# Patient Record
Sex: Female | Born: 1950
Health system: Southern US, Community
[De-identification: ages and names within clinical notes are randomized; demographics above are authoritative.]

## PROBLEM LIST (undated history)

## (undated) DIAGNOSIS — E119 Type 2 diabetes mellitus without complications: Secondary | ICD-10-CM

## (undated) DIAGNOSIS — K449 Diaphragmatic hernia without obstruction or gangrene: Secondary | ICD-10-CM

## (undated) DIAGNOSIS — F411 Generalized anxiety disorder: Secondary | ICD-10-CM

## (undated) DIAGNOSIS — Z974 Presence of external hearing-aid: Secondary | ICD-10-CM

## (undated) DIAGNOSIS — K222 Esophageal obstruction: Secondary | ICD-10-CM

## (undated) DIAGNOSIS — T7840XA Allergy, unspecified, initial encounter: Secondary | ICD-10-CM

## (undated) DIAGNOSIS — R1310 Dysphagia, unspecified: Secondary | ICD-10-CM

## (undated) DIAGNOSIS — I10 Essential (primary) hypertension: Secondary | ICD-10-CM

## (undated) DIAGNOSIS — G5600 Carpal tunnel syndrome, unspecified upper limb: Secondary | ICD-10-CM

## (undated) DIAGNOSIS — K219 Gastro-esophageal reflux disease without esophagitis: Secondary | ICD-10-CM

## (undated) DIAGNOSIS — IMO0001 Reserved for inherently not codable concepts without codable children: Secondary | ICD-10-CM

## (undated) DIAGNOSIS — M6530 Trigger finger, unspecified finger: Secondary | ICD-10-CM

## (undated) DIAGNOSIS — R42 Dizziness and giddiness: Secondary | ICD-10-CM

## (undated) DIAGNOSIS — N8110 Cystocele, unspecified: Secondary | ICD-10-CM

## (undated) DIAGNOSIS — Z794 Long term (current) use of insulin: Secondary | ICD-10-CM

## (undated) DIAGNOSIS — M1990 Unspecified osteoarthritis, unspecified site: Secondary | ICD-10-CM

## (undated) DIAGNOSIS — E785 Hyperlipidemia, unspecified: Secondary | ICD-10-CM

## (undated) DIAGNOSIS — J302 Other seasonal allergic rhinitis: Secondary | ICD-10-CM

## (undated) DIAGNOSIS — Z8489 Family history of other specified conditions: Secondary | ICD-10-CM

## (undated) DIAGNOSIS — K224 Dyskinesia of esophagus: Secondary | ICD-10-CM

## (undated) DIAGNOSIS — F329 Major depressive disorder, single episode, unspecified: Secondary | ICD-10-CM

## (undated) DIAGNOSIS — K589 Irritable bowel syndrome without diarrhea: Secondary | ICD-10-CM

## (undated) DIAGNOSIS — L309 Dermatitis, unspecified: Secondary | ICD-10-CM

## (undated) DIAGNOSIS — F32A Depression, unspecified: Secondary | ICD-10-CM

## (undated) DIAGNOSIS — Z98811 Dental restoration status: Secondary | ICD-10-CM

## (undated) HISTORY — DX: Allergy, unspecified, initial encounter: T78.40XA

## (undated) HISTORY — PX: EYE SURGERY: SHX253

## (undated) HISTORY — DX: Dizziness and giddiness: R42

## (undated) HISTORY — DX: Irritable bowel syndrome, unspecified: K58.9

## (undated) HISTORY — PX: ADENOIDECTOMY: SUR15

## (undated) HISTORY — DX: Unspecified osteoarthritis, unspecified site: M19.90

## (undated) HISTORY — DX: Carpal tunnel syndrome, unspecified upper limb: G56.00

## (undated) HISTORY — DX: Depression, unspecified: F32.A

## (undated) HISTORY — DX: Gastro-esophageal reflux disease without esophagitis: K21.9

## (undated) HISTORY — DX: Type 2 diabetes mellitus without complications: E11.9

## (undated) HISTORY — DX: Dyskinesia of esophagus: K22.4

## (undated) HISTORY — DX: Cystocele, unspecified: N81.10

## (undated) HISTORY — DX: Essential (primary) hypertension: I10

## (undated) HISTORY — DX: Generalized anxiety disorder: F41.1

## (undated) HISTORY — PX: COLONOSCOPY: SHX174

## (undated) HISTORY — DX: Hyperlipidemia, unspecified: E78.5

## (undated) HISTORY — DX: Diaphragmatic hernia without obstruction or gangrene: K44.9

## (undated) HISTORY — PX: TONSILLECTOMY: SUR1361

## (undated) HISTORY — DX: Esophageal obstruction: K22.2

## (undated) HISTORY — DX: Major depressive disorder, single episode, unspecified: F32.9

## (undated) HISTORY — PX: NASAL SINUS SURGERY: SHX719

---

## 1998-01-10 ENCOUNTER — Other Ambulatory Visit: Admission: RE | Admit: 1998-01-10 | Discharge: 1998-01-10 | Payer: Self-pay | Admitting: *Deleted

## 1998-11-27 ENCOUNTER — Other Ambulatory Visit: Admission: RE | Admit: 1998-11-27 | Discharge: 1998-11-27 | Payer: Self-pay | Admitting: Obstetrics & Gynecology

## 1999-05-16 ENCOUNTER — Other Ambulatory Visit: Admission: RE | Admit: 1999-05-16 | Discharge: 1999-05-16 | Payer: Self-pay | Admitting: Gastroenterology

## 1999-05-16 ENCOUNTER — Encounter (INDEPENDENT_AMBULATORY_CARE_PROVIDER_SITE_OTHER): Payer: Self-pay | Admitting: Specialist

## 1999-08-30 ENCOUNTER — Encounter: Payer: Self-pay | Admitting: Obstetrics and Gynecology

## 1999-08-30 ENCOUNTER — Encounter: Admission: RE | Admit: 1999-08-30 | Discharge: 1999-08-30 | Payer: Self-pay | Admitting: Obstetrics & Gynecology

## 2000-01-14 ENCOUNTER — Other Ambulatory Visit: Admission: RE | Admit: 2000-01-14 | Discharge: 2000-01-14 | Payer: Self-pay | Admitting: Obstetrics and Gynecology

## 2000-04-14 ENCOUNTER — Encounter: Admission: RE | Admit: 2000-04-14 | Discharge: 2000-07-13 | Payer: Self-pay | Admitting: *Deleted

## 2001-04-13 ENCOUNTER — Other Ambulatory Visit: Admission: RE | Admit: 2001-04-13 | Discharge: 2001-04-13 | Payer: Self-pay | Admitting: Obstetrics and Gynecology

## 2002-04-26 ENCOUNTER — Other Ambulatory Visit: Admission: RE | Admit: 2002-04-26 | Discharge: 2002-04-26 | Payer: Self-pay | Admitting: Obstetrics and Gynecology

## 2002-04-28 ENCOUNTER — Encounter: Admission: RE | Admit: 2002-04-28 | Discharge: 2002-04-28 | Payer: Self-pay | Admitting: Obstetrics and Gynecology

## 2002-04-28 ENCOUNTER — Encounter: Payer: Self-pay | Admitting: Obstetrics and Gynecology

## 2003-06-16 ENCOUNTER — Other Ambulatory Visit: Admission: RE | Admit: 2003-06-16 | Discharge: 2003-06-16 | Payer: Self-pay | Admitting: Obstetrics and Gynecology

## 2003-09-28 ENCOUNTER — Encounter: Admission: RE | Admit: 2003-09-28 | Discharge: 2003-09-28 | Payer: Self-pay | Admitting: Obstetrics and Gynecology

## 2003-10-18 ENCOUNTER — Encounter: Admission: RE | Admit: 2003-10-18 | Discharge: 2003-11-15 | Payer: Self-pay | Admitting: Orthopedic Surgery

## 2003-12-13 ENCOUNTER — Ambulatory Visit (HOSPITAL_COMMUNITY): Admission: RE | Admit: 2003-12-13 | Discharge: 2003-12-13 | Payer: Self-pay | Admitting: Gastroenterology

## 2003-12-14 ENCOUNTER — Encounter: Payer: Self-pay | Admitting: Gastroenterology

## 2003-12-14 DIAGNOSIS — K5289 Other specified noninfective gastroenteritis and colitis: Secondary | ICD-10-CM

## 2004-10-05 ENCOUNTER — Encounter: Admission: RE | Admit: 2004-10-05 | Discharge: 2004-10-05 | Payer: Self-pay | Admitting: Obstetrics and Gynecology

## 2004-10-18 ENCOUNTER — Encounter: Admission: RE | Admit: 2004-10-18 | Discharge: 2004-10-18 | Payer: Self-pay | Admitting: Obstetrics and Gynecology

## 2004-11-26 ENCOUNTER — Other Ambulatory Visit: Admission: RE | Admit: 2004-11-26 | Discharge: 2004-11-26 | Payer: Self-pay | Admitting: Obstetrics and Gynecology

## 2004-11-27 ENCOUNTER — Ambulatory Visit (HOSPITAL_COMMUNITY): Admission: RE | Admit: 2004-11-27 | Discharge: 2004-11-27 | Payer: Self-pay | Admitting: Obstetrics and Gynecology

## 2005-11-22 ENCOUNTER — Encounter: Admission: RE | Admit: 2005-11-22 | Discharge: 2005-11-22 | Payer: Self-pay | Admitting: Obstetrics and Gynecology

## 2006-01-13 ENCOUNTER — Other Ambulatory Visit: Admission: RE | Admit: 2006-01-13 | Discharge: 2006-01-13 | Payer: Self-pay | Admitting: Obstetrics and Gynecology

## 2006-04-15 ENCOUNTER — Ambulatory Visit: Payer: Self-pay | Admitting: Gastroenterology

## 2006-05-12 ENCOUNTER — Ambulatory Visit: Payer: Self-pay | Admitting: Gastroenterology

## 2006-05-12 ENCOUNTER — Encounter (INDEPENDENT_AMBULATORY_CARE_PROVIDER_SITE_OTHER): Payer: Self-pay | Admitting: *Deleted

## 2006-05-12 DIAGNOSIS — K2990 Gastroduodenitis, unspecified, without bleeding: Secondary | ICD-10-CM

## 2006-05-12 DIAGNOSIS — K2210 Ulcer of esophagus without bleeding: Secondary | ICD-10-CM

## 2006-05-12 DIAGNOSIS — K2970 Gastritis, unspecified, without bleeding: Secondary | ICD-10-CM | POA: Insufficient documentation

## 2006-05-28 ENCOUNTER — Encounter: Payer: Self-pay | Admitting: Gastroenterology

## 2006-05-28 ENCOUNTER — Ambulatory Visit (HOSPITAL_COMMUNITY): Admission: RE | Admit: 2006-05-28 | Discharge: 2006-05-28 | Payer: Self-pay | Admitting: Gastroenterology

## 2006-05-28 HISTORY — PX: ESOPHAGEAL MANOMETRY: SHX1526

## 2006-06-20 ENCOUNTER — Ambulatory Visit: Payer: Self-pay | Admitting: Gastroenterology

## 2006-06-25 ENCOUNTER — Ambulatory Visit: Payer: Self-pay | Admitting: Gastroenterology

## 2006-07-09 ENCOUNTER — Ambulatory Visit: Payer: Self-pay | Admitting: Gastroenterology

## 2006-08-12 ENCOUNTER — Ambulatory Visit: Payer: Self-pay | Admitting: Gastroenterology

## 2007-03-24 ENCOUNTER — Ambulatory Visit: Payer: Self-pay | Admitting: Gastroenterology

## 2007-03-31 ENCOUNTER — Encounter: Admission: RE | Admit: 2007-03-31 | Discharge: 2007-03-31 | Payer: Self-pay | Admitting: Obstetrics and Gynecology

## 2007-04-14 ENCOUNTER — Ambulatory Visit: Payer: Self-pay | Admitting: Psychology

## 2007-04-28 ENCOUNTER — Ambulatory Visit: Payer: Self-pay | Admitting: Psychology

## 2007-05-06 ENCOUNTER — Ambulatory Visit: Payer: Self-pay | Admitting: Psychology

## 2007-05-13 ENCOUNTER — Ambulatory Visit: Payer: Self-pay | Admitting: Psychology

## 2007-05-19 ENCOUNTER — Ambulatory Visit: Payer: Self-pay | Admitting: Psychology

## 2007-05-26 ENCOUNTER — Ambulatory Visit: Payer: Self-pay | Admitting: Psychology

## 2007-06-03 ENCOUNTER — Ambulatory Visit: Payer: Self-pay | Admitting: Psychology

## 2007-06-16 ENCOUNTER — Ambulatory Visit: Payer: Self-pay | Admitting: Psychology

## 2007-07-02 ENCOUNTER — Ambulatory Visit: Payer: Self-pay | Admitting: Psychology

## 2007-07-28 ENCOUNTER — Ambulatory Visit: Payer: Self-pay | Admitting: Psychology

## 2007-08-12 ENCOUNTER — Ambulatory Visit: Payer: Self-pay | Admitting: Psychology

## 2007-09-09 ENCOUNTER — Ambulatory Visit: Payer: Self-pay | Admitting: Psychology

## 2007-09-21 ENCOUNTER — Ambulatory Visit: Payer: Self-pay | Admitting: Psychology

## 2007-10-06 ENCOUNTER — Ambulatory Visit: Payer: Self-pay | Admitting: Psychology

## 2007-11-03 ENCOUNTER — Ambulatory Visit: Payer: Self-pay | Admitting: Psychology

## 2007-11-17 ENCOUNTER — Ambulatory Visit: Payer: Self-pay | Admitting: Psychology

## 2007-12-01 ENCOUNTER — Ambulatory Visit: Payer: Self-pay | Admitting: Psychology

## 2007-12-15 ENCOUNTER — Ambulatory Visit: Payer: Self-pay | Admitting: Psychology

## 2007-12-29 ENCOUNTER — Ambulatory Visit: Payer: Self-pay | Admitting: Psychology

## 2008-01-12 ENCOUNTER — Ambulatory Visit: Payer: Self-pay | Admitting: Psychology

## 2008-01-22 DIAGNOSIS — E785 Hyperlipidemia, unspecified: Secondary | ICD-10-CM

## 2008-01-22 DIAGNOSIS — Z8719 Personal history of other diseases of the digestive system: Secondary | ICD-10-CM | POA: Insufficient documentation

## 2008-01-26 ENCOUNTER — Ambulatory Visit: Payer: Self-pay | Admitting: Psychology

## 2008-02-09 ENCOUNTER — Ambulatory Visit: Payer: Self-pay | Admitting: Psychology

## 2008-02-23 ENCOUNTER — Ambulatory Visit: Payer: Self-pay | Admitting: Psychology

## 2008-03-08 ENCOUNTER — Ambulatory Visit: Payer: Self-pay | Admitting: Psychology

## 2008-04-05 ENCOUNTER — Ambulatory Visit: Payer: Self-pay | Admitting: Psychology

## 2008-04-19 ENCOUNTER — Ambulatory Visit: Payer: Self-pay | Admitting: Psychology

## 2008-05-03 ENCOUNTER — Ambulatory Visit: Payer: Self-pay | Admitting: Psychology

## 2008-05-17 ENCOUNTER — Ambulatory Visit: Payer: Self-pay | Admitting: Psychology

## 2008-05-30 ENCOUNTER — Encounter: Admission: RE | Admit: 2008-05-30 | Discharge: 2008-05-30 | Payer: Self-pay | Admitting: Obstetrics and Gynecology

## 2008-05-31 ENCOUNTER — Ambulatory Visit: Payer: Self-pay | Admitting: Psychology

## 2008-06-28 ENCOUNTER — Ambulatory Visit: Payer: Self-pay | Admitting: Psychology

## 2008-07-12 ENCOUNTER — Ambulatory Visit: Payer: Self-pay | Admitting: Psychology

## 2008-07-26 ENCOUNTER — Ambulatory Visit: Payer: Self-pay | Admitting: Psychology

## 2008-08-23 ENCOUNTER — Ambulatory Visit: Payer: Self-pay | Admitting: Psychology

## 2008-09-06 ENCOUNTER — Ambulatory Visit: Payer: Self-pay | Admitting: Psychology

## 2008-09-20 ENCOUNTER — Ambulatory Visit: Payer: Self-pay | Admitting: Psychology

## 2008-10-04 ENCOUNTER — Ambulatory Visit: Payer: Self-pay | Admitting: Psychology

## 2008-11-01 ENCOUNTER — Ambulatory Visit: Payer: Self-pay | Admitting: Psychology

## 2008-11-15 ENCOUNTER — Ambulatory Visit: Payer: Self-pay | Admitting: Psychology

## 2008-11-29 ENCOUNTER — Ambulatory Visit: Payer: Self-pay | Admitting: Psychology

## 2008-12-02 ENCOUNTER — Ambulatory Visit: Payer: Self-pay | Admitting: Gastroenterology

## 2008-12-02 DIAGNOSIS — R1319 Other dysphagia: Secondary | ICD-10-CM

## 2008-12-02 DIAGNOSIS — F411 Generalized anxiety disorder: Secondary | ICD-10-CM

## 2008-12-02 DIAGNOSIS — K219 Gastro-esophageal reflux disease without esophagitis: Secondary | ICD-10-CM | POA: Insufficient documentation

## 2008-12-02 DIAGNOSIS — E118 Type 2 diabetes mellitus with unspecified complications: Secondary | ICD-10-CM

## 2008-12-05 ENCOUNTER — Ambulatory Visit: Payer: Self-pay | Admitting: Gastroenterology

## 2008-12-05 LAB — CONVERTED CEMR LAB: UREASE: NEGATIVE

## 2008-12-26 ENCOUNTER — Telehealth: Payer: Self-pay | Admitting: Gastroenterology

## 2008-12-27 ENCOUNTER — Ambulatory Visit: Payer: Self-pay | Admitting: Psychology

## 2009-01-24 ENCOUNTER — Ambulatory Visit: Payer: Self-pay | Admitting: Psychology

## 2009-02-07 ENCOUNTER — Ambulatory Visit: Payer: Self-pay | Admitting: Psychology

## 2009-03-07 ENCOUNTER — Ambulatory Visit: Payer: Self-pay | Admitting: Psychology

## 2009-03-21 ENCOUNTER — Ambulatory Visit: Payer: Self-pay | Admitting: Psychology

## 2009-04-04 ENCOUNTER — Ambulatory Visit: Payer: Self-pay | Admitting: Psychology

## 2009-04-18 ENCOUNTER — Ambulatory Visit: Payer: Self-pay | Admitting: Psychology

## 2009-05-02 ENCOUNTER — Ambulatory Visit: Payer: Self-pay | Admitting: Psychology

## 2009-05-16 ENCOUNTER — Ambulatory Visit: Payer: Self-pay | Admitting: Psychology

## 2009-05-30 ENCOUNTER — Ambulatory Visit: Payer: Self-pay | Admitting: Psychology

## 2009-06-08 ENCOUNTER — Telehealth: Payer: Self-pay | Admitting: Gastroenterology

## 2009-06-13 ENCOUNTER — Ambulatory Visit: Payer: Self-pay | Admitting: Psychology

## 2009-06-27 ENCOUNTER — Ambulatory Visit: Payer: Self-pay | Admitting: Psychology

## 2009-07-11 ENCOUNTER — Ambulatory Visit: Payer: Self-pay | Admitting: Psychology

## 2009-07-25 ENCOUNTER — Ambulatory Visit: Payer: Self-pay | Admitting: Psychology

## 2009-08-08 ENCOUNTER — Ambulatory Visit: Payer: Self-pay | Admitting: Psychology

## 2009-08-08 ENCOUNTER — Encounter: Admission: RE | Admit: 2009-08-08 | Discharge: 2009-08-08 | Payer: Self-pay | Admitting: Obstetrics and Gynecology

## 2009-08-22 ENCOUNTER — Ambulatory Visit: Payer: Self-pay | Admitting: Psychology

## 2009-09-05 ENCOUNTER — Ambulatory Visit: Payer: Self-pay | Admitting: Psychology

## 2009-09-19 ENCOUNTER — Ambulatory Visit: Payer: Self-pay | Admitting: Psychology

## 2009-10-03 ENCOUNTER — Ambulatory Visit: Payer: Self-pay | Admitting: Psychology

## 2009-10-17 ENCOUNTER — Ambulatory Visit: Payer: Self-pay | Admitting: Psychology

## 2009-10-31 ENCOUNTER — Ambulatory Visit: Payer: Self-pay | Admitting: Psychology

## 2009-11-28 ENCOUNTER — Ambulatory Visit: Payer: Self-pay | Admitting: Psychology

## 2009-12-26 ENCOUNTER — Ambulatory Visit: Payer: Self-pay | Admitting: Psychology

## 2009-12-27 ENCOUNTER — Telehealth: Payer: Self-pay | Admitting: Gastroenterology

## 2010-02-20 ENCOUNTER — Ambulatory Visit: Payer: Self-pay | Admitting: Psychology

## 2010-04-17 ENCOUNTER — Ambulatory Visit: Payer: Self-pay | Admitting: Psychology

## 2010-06-12 ENCOUNTER — Ambulatory Visit: Payer: Self-pay | Admitting: Psychology

## 2010-08-07 ENCOUNTER — Ambulatory Visit: Payer: Self-pay | Admitting: Psychology

## 2010-08-14 ENCOUNTER — Encounter: Admission: RE | Admit: 2010-08-14 | Discharge: 2010-08-14 | Payer: Self-pay | Admitting: Obstetrics and Gynecology

## 2010-08-16 ENCOUNTER — Encounter (INDEPENDENT_AMBULATORY_CARE_PROVIDER_SITE_OTHER): Payer: Self-pay | Admitting: *Deleted

## 2010-08-16 ENCOUNTER — Ambulatory Visit: Payer: Self-pay | Admitting: Gastroenterology

## 2010-08-16 DIAGNOSIS — K589 Irritable bowel syndrome without diarrhea: Secondary | ICD-10-CM

## 2010-08-16 LAB — CONVERTED CEMR LAB: IgA: 90 mg/dL (ref 68–378)

## 2010-08-17 LAB — CONVERTED CEMR LAB
ALT: 29 units/L (ref 0–35)
BUN: 15 mg/dL (ref 6–23)
Basophils Absolute: 0 10*3/uL (ref 0.0–0.1)
Basophils Relative: 0.3 % (ref 0.0–3.0)
Bilirubin, Direct: 0.1 mg/dL (ref 0.0–0.3)
Calcium: 9.6 mg/dL (ref 8.4–10.5)
Creatinine, Ser: 0.6 mg/dL (ref 0.4–1.2)
Eosinophils Absolute: 0.4 10*3/uL (ref 0.0–0.7)
Ferritin: 12.9 ng/mL (ref 10.0–291.0)
HCT: 37.8 % (ref 36.0–46.0)
Hemoglobin: 12.6 g/dL (ref 12.0–15.0)
Iron: 34 ug/dL — ABNORMAL LOW (ref 42–145)
Lymphs Abs: 2.2 10*3/uL (ref 0.7–4.0)
MCHC: 33.2 g/dL (ref 30.0–36.0)
MCV: 88.1 fL (ref 78.0–100.0)
Monocytes Absolute: 0.6 10*3/uL (ref 0.1–1.0)
Neutro Abs: 8.3 10*3/uL — ABNORMAL HIGH (ref 1.4–7.7)
RBC: 4.3 M/uL (ref 3.87–5.11)
RDW: 14.1 % (ref 11.5–14.6)
TSH: 2.1 microintl units/mL (ref 0.35–5.50)
Total Bilirubin: 0.4 mg/dL (ref 0.3–1.2)
Total Protein: 7.1 g/dL (ref 6.0–8.3)
Transferrin: 360.9 mg/dL — ABNORMAL HIGH (ref 212.0–360.0)
Vitamin B-12: 577 pg/mL (ref 211–911)

## 2010-08-28 ENCOUNTER — Encounter: Admission: RE | Admit: 2010-08-28 | Discharge: 2010-08-28 | Payer: Self-pay | Admitting: Obstetrics and Gynecology

## 2010-09-07 ENCOUNTER — Ambulatory Visit: Payer: Self-pay | Admitting: Psychology

## 2010-09-14 ENCOUNTER — Ambulatory Visit: Payer: Self-pay | Admitting: Gastroenterology

## 2010-09-14 LAB — CONVERTED CEMR LAB: UREASE: NEGATIVE

## 2010-09-18 ENCOUNTER — Encounter: Payer: Self-pay | Admitting: Gastroenterology

## 2010-10-02 ENCOUNTER — Ambulatory Visit: Admit: 2010-10-02 | Payer: Self-pay | Admitting: Psychology

## 2010-10-21 ENCOUNTER — Encounter: Payer: Self-pay | Admitting: Obstetrics and Gynecology

## 2010-10-30 ENCOUNTER — Ambulatory Visit: Admit: 2010-10-30 | Payer: Self-pay | Admitting: Psychology

## 2010-10-30 NOTE — Letter (Signed)
Summary: [facility] Instructions  [facility]  [address] [place], [place] [address]   Phone: [phone]  Fax: [phone]       Adrienne [name]    Aug 07, 1951    MRN: [id number]        Procedure Day /Date: 09/14/2010 Friday     Arrival Time: 1:00pm     Procedure Time: 2:00pm     Location of Procedure:                    X  [facility] (4th Floor)                        PREPARATION FOR COLONOSCOPY WITH MOVIPREP   Starting 5 days prior to your procedure 09/09/2010 do not eat nuts, seeds, popcorn, corn, beans, peas,  salads, or any raw vegetables.  Do not take any fiber supplements (e.g. Metamucil, Citrucel, and Benefiber).  THE DAY BEFORE YOUR PROCEDURE         DATE: 09/13/2010  DAY: Thursday  1.  Drink clear liquids the entire day-NO SOLID FOOD  2.  Do not drink anything colored red or purple.  Avoid juices with pulp.  No orange juice.  3.  Drink at least 64 oz. (8 glasses) of fluid/clear liquids during the day to prevent dehydration and help the prep work efficiently.  CLEAR LIQUIDS INCLUDE: Water Jello Ice Popsicles Tea (sugar ok, no milk/cream) Powdered fruit flavored drinks Coffee (sugar ok, no milk/cream) Gatorade Juice: apple, white grape, white cranberry  Lemonade Clear bullion, consomm, broth Carbonated beverages (any kind) Strained chicken noodle soup Hard Candy                             4.  In the morning, mix first dose of MoviPrep solution:    Empty 1 Pouch A and 1 Pouch B into the disposable container    Add lukewarm drinking water to the top line of the container. Mix to dissolve    Refrigerate (mixed solution should be used within 24 hrs)  5.  Begin drinking the prep at 5:00 p.m. The MoviPrep container is divided by 4 marks.   Every 15 minutes drink the solution down to the next mark (approximately 8 oz) until the full liter is complete.   6.  Follow completed prep with 16 oz of clear liquid of your choice  (Nothing red or purple).  Continue to drink clear liquids until bedtime.  7.  Before going to bed, mix second dose of MoviPrep solution:    Empty 1 Pouch A and 1 Pouch B into the disposable container    Add lukewarm drinking water to the top line of the container. Mix to dissolve    Refrigerate  THE DAY OF YOUR PROCEDURE      DATE: 09/14/2010  DAY: Friday  Beginning at 9:00am (5 hours before procedure):         1. Every 15 minutes, drink the solution down to the next mark (approx 8 oz) until the full liter is complete.  2. Follow completed prep with 16 oz. of clear liquid of your choice.    3. You may drink clear liquids until 12:00pm (2 HOURS BEFORE PROCEDURE).   MEDICATION INSTRUCTIONS  Unless otherwise instructed, you should take regular prescription medications with a small sip of water   as early as possible the morning of your procedure.  OTHER INSTRUCTIONS  You will need a responsible adult at least 61 years of age to accompany you and drive you home.   This person must remain in the waiting room during your procedure.  Wear loose fitting clothing that is easily removed.  Leave jewelry and other valuables at home.  However, you may wish to bring a book to read or  an iPod/MP3 player to listen to music as you wait for your procedure to start.  Remove all body piercing jewelry and leave at home.  Total time from sign-in until discharge is approximately 2-3 hours.  You should go home directly after your procedure and rest.  You can resume normal activities the  day after your procedure.  The day of your procedure you should not:   Drive   Make legal decisions   Operate machinery   Drink alcohol   Return to work  You will receive specific instructions about eating, activities and medications before you leave.    The above instructions have been reviewed and explained to me by   _______________________    I fully understand and can  verbalize these instructions _____________________________ Date _________

## 2010-10-30 NOTE — Assessment & Plan Note (Signed)
Summary: ESOPHAGUS CHECK UP & IBS F-UP/YF    History of Present Illness Visit Type: Follow-up Visit Primary GI MD: Sheryn Bison MD FACP FAGA Primary Provider: Guerry Bruin, MD Chief Complaint: Dysphagia, coughing food back up x 6 months, also IBS History of Present Illness:   60 year old Caucasian female with a family history of colon cancer due for colonoscopy followup. She also has chronic GERD and associated esophageal spasm. Other problems include a chronic anxiety disorder and she is on imipramine and Klonopin. She is doing fairly well on Nexium 40 mg a day but occasionally has to use this twice a day. She has regurgitation frequently but denies true dysphagia. She denies any hepatobiliary problems. She does have chronic diarrhea and review of her chart shows that she did have a left-sided inflammatory bowel disease in 2005. She had a brief trial of Asacol without improvement. Currently she has crampy lower abdominal pain frequent diarrhea but no rectal bleeding. She denies any specific food intolerances or systemic complaints.   GI Review of Systems    Reports acid reflux, dysphagia with liquids, and  dysphagia with solids.      Denies abdominal pain, belching, bloating, chest pain, heartburn, loss of appetite, nausea, vomiting, vomiting blood, weight loss, and  weight gain.      Reports diarrhea and  irritable bowel syndrome.     Denies anal fissure, black tarry stools, change in bowel habit, constipation, diverticulosis, fecal incontinence, heme positive stool, hemorrhoids, jaundice, light color stool, liver problems, rectal bleeding, and  rectal pain.    Current Medications (verified): 1)  Isosorbide Mononitrate Cr 30 Mg Xr24h-Tab (Isosorbide Mononitrate) .... Take 1/2 Tablet By Mouth Every Morning 2)  Meloxicam 15 Mg Tabs (Meloxicam) .... Take 1 Tablet By Mouth Once Daily 3)  Aspirin Ec 81 Mg Tbec (Aspirin) .... Take 1 Tablet By Mouth Once A Day 4)  Avandamet 12-998 Mg Tabs  (Rosiglitazone-Metformin) .... Take 1 Tablet By Mouth Two Times A Day 5)  Imipramine Hcl 25 Mg Tabs (Imipramine Hcl) .... Take 1 Tab By Mouth At Bedtime 6)  Simvastatin 40 Mg Tabs (Simvastatin) .... Take 1 Tablet By Mouth Once A Day 7)  Fluoxetine Hcl 40 Mg Caps (Fluoxetine Hcl) .... Take 1 Tablet By Mouth Every Morning 8)  Ramipril 10 Mg Caps (Ramipril) .... Take 1 Tablet By Mouth Once A Day 9)  Nexium 40 Mg Cpdr (Esomeprazole Magnesium) .... Take 1 Tablet By Mouth Two Times A Day 10)  Vitamin D3 1000 Unit Caps (Cholecalciferol) .... Take 1 Tablet By Mouth Two Times A Day 11)  Calcium 1200 Mg/vitamin D .... Take 1 Tablet By Mouth Two Times A Day 12)  Klonopin 0.5 Mg Tabs (Clonazepam) .... Take One By Mouth Every 12 Hours As Needed 13)  Symbyax 6-25 Mg Caps (Olanzapine-Fluoxetine Hcl) .... Take 1 Capsule By Mouth Once Daily 14)  Kombiglyze Xr 2.5/1000mg  .... Take 1 Tablet By Mouth Two Times A Day  Allergies (verified): 1)  ! Sulfa  Past History:  Past medical, surgical, family and social histories (including risk factors) reviewed for relevance to current acute and chronic problems.  Past Medical History: Reviewed history from 01/22/2008 and no changes required. Current Problems:  IRRITABLE BOWEL SYNDROME, HX OF (ICD-V12.79) Hx of HYPERLIPIDEMIA (ICD-272.4) DIABETES MELLITUS, TYPE I, ADULT ONSET (ICD-250.01) COLITIS (ICD-558.9) GASTRITIS (ICD-535.50) ULCER OF ESOPHAGUS WITHOUT BLEEDING (ICD-530.20)  Past Surgical History: tonsillectomy eye surgery-right eye sinus surgery  Family History: Reviewed history from 12/02/2008 and no changes required. Family History of  Colon Cancer: MGM Family History of Stomach Cancer: MGF Family History of Prostate Cancer: PGF Family History of Diabetes: Sister, Father Family History of Heart Disease: Mother, Father Family History of Inflammatory Bowel Disease: Grandson  Social History: Reviewed history from 12/02/2008 and no changes  required. Occupation: Warehouse manager Patient has never smoked.  Alcohol Use - yes-occasional Daily Caffeine Use Illicit Drug Use - no Patient does not get regular exercise.   Review of Systems       The patient complains of allergy/sinus, anxiety-new, arthritis/joint pain, cough, depression-new, fatigue, and muscle pains/cramps.  The patient denies anemia, back pain, blood in urine, breast changes/lumps, change in vision, confusion, coughing up blood, fainting, fever, headaches-new, hearing problems, heart murmur, heart rhythm changes, itching, menstrual pain, night sweats, nosebleeds, pregnancy symptoms, shortness of breath, skin rash, sleeping problems, sore throat, swelling of feet/legs, swollen lymph glands, thirst - excessive , urination - excessive , urination changes/pain, urine leakage, vision changes, and voice change.    Vital Signs:  Patient profile:   60 year old female Height:      61 inches Weight:      172.13 pounds BMI:     32.64 Pulse rate:   92 / minute Pulse rhythm:   regular BP sitting:   100 / 62  (left arm) Cuff size:   regular  Vitals Entered By: June McMurray CMA Duncan Dull) (August 16, 2010 11:10 AM)  Physical Exam  General:  Well developed, well nourished, no acute distress.healthy appearing.   Head:  Normocephalic and atraumatic. Eyes:  PERRLA, no icterus.exam deferred to patient's ophthalmologist.   Neck:  Supple; no masses or thyromegaly. Lungs:  Clear throughout to auscultation. Heart:  Regular rate and rhythm; no murmurs, rubs,  or bruits. Abdomen:  Soft, nontender and nondistended. No masses, hepatosplenomegaly or hernias noted. Normal bowel sounds. Extremities:  No clubbing, cyanosis, edema or deformities noted. Neurologic:  Alert and  oriented x4;  grossly normal neurologically. Cervical Nodes:  No significant cervical adenopathy. Psych:  Alert and cooperative. Normal mood and affect.depressed affect.     Impression & Recommendations:  Problem # 1:   IBS (ICD-564.1) Assessment Unchanged labs ordered and also will check followup colonoscopy to exclude inflammatory bowel disease or microscopic-collagenous colitis. Her diarrhea seems rather unusual for ordinary IBS. Possibly her diarrhea could be drug-induced from PPI therapy.Celiac antibodies also ordered. Orders: Colon/Endo (Colon/Endo) TLB-CBC Platelet - w/Differential (85025-CBCD) TLB-BMP (Basic Metabolic Panel-BMET) (80048-METABOL) TLB-Hepatic/Liver Function Pnl (80076-HEPATIC) TLB-TSH (Thyroid Stimulating Hormone) (84443-TSH) TLB-B12, Serum-Total ONLY ([address]-U04) TLB-Ferritin (82728-FER) TLB-Folic Acid (Folate) (82746-FOL) TLB-IBC Pnl (Iron/FE;Transferrin) (83550-IBC) T-igA (54098) T-Sprue Panel (Celiac Disease Aby Eval) (83516x3/[address])  Problem # 2:  GENERALIZED ANXIETY DISORDER (ICD-300.02) Assessment: Improved continue Klonopin 0.5 mg twice a day as needed.  Problem # 3:  DYSPHAGIA (JXB-147.82) Assessment: Deteriorated followup endoscopy and possible esophageal dilation. She is to continue Nexium q.a.m. and twice a day if needed. For her esophageal spasm she does take Isorbid 30 mg XR a half a tablet a day and Klonopin. Previous manometry as confirmed" nutcracker esophagus". In her case, her esophageal spasm as a combination of GERD and a suspected esophageal motility disorder.  Problem # 4:  DIAB W/UNS COMP TYPE II/UNS NOT STATED UNCNTRL (ICD-250.90) Assessment: Improved Continue Avandamet as per primary care. Adjustments in her medications will be made for endoscopic procedures.  Patient Instructions: 1)  Copy sent to : Guerry Bruin, MD 2)  [facility] Patient Information Guide given to patient.  3)  Colonoscopy and Flexible Sigmoidoscopy  brochure given.  4)  Upper Endoscopy brochure given.  5)  Please go to the basement today for your labs.  6)  Your procedure has been scheduled for 09/14/2010, please follow the seperate instructions.  7)  Please  continue current medications.  8)  The medication list was reviewed and reconciled.  All changed / newly prescribed medications were explained.  A complete medication list was provided to the patient / caregiver. Prescriptions: KLONOPIN 0.5 MG TABS (CLONAZEPAM) take one by mouth every 12 hours as needed  #60 x 3   Entered by:   Harlow Mares CMA (AAMA)   Authorized by:   Mardella Layman MD [facility]   Signed by:   Harlow Mares CMA (AAMA) on 08/16/2010   Method used:   Print then Give to Patient   RxID:   [id number] MOVIPREP 100 GM  SOLR (PEG-KCL-NACL-NASULF-NA ASC-C) As per prep instructions.  #1 x 0   Entered by:   Harlow Mares CMA (AAMA)   Authorized by:   Mardella Layman MD [facility]   Signed by:   Harlow Mares CMA (AAMA) on 08/16/2010   Method used:   Print then Give to Patient   RxID:   [id number]

## 2010-10-30 NOTE — Progress Notes (Signed)
Summary: Refill   Phone Note Call from Patient Call back at Work Phone [phone]   Call For: Dr Jarold Motto Summary of Call: Needs a refill on Clonazepam sent to [facility] pharmacy in [place]. Will you let her know when it has been done. Initial call taken by: Leanor Kail [facility],  December 27, 2009 12:29 PM  Follow-up for Phone Call        Pt notified. Follow-up by: Ashok Cordia RN,  December 27, 2009 12:42 PM    Prescriptions: KLONOPIN 0.5 MG TABS (CLONAZEPAM) take one by mouth every 12 hours as needed  #60 x 3   Entered by:   Ashok Cordia RN   Authorized by:   Mardella Layman MD [facility]   Signed by:   Ashok Cordia RN on 12/27/2009   Method used:   Printed then faxed to ...       [phone]* (retail)       [address] [place], [place]  [address]       Ph: [phone] or [phone]       Fax: [phone]   RxID:   [id number]

## 2010-11-01 NOTE — Procedures (Signed)
Summary: Upper Endoscopy  Patient: [name] Note: All result statuses are Final unless otherwise noted.  Tests: (1) Upper Endoscopy (EGD)   EGD Upper Endoscopy       DONE     [facility]     [address]     [place], [place]  [address]           ENDOSCOPY PROCEDURE REPORT           PATIENT:  Adrienne [name], Adrienne [name]  MR#:  [id number]     BIRTHDATE:  01/08/1951, 59 yrs. old  GENDER:  female           ENDOSCOPIST:  [name]. Jarold Motto, MD, [facility]     Referred by:           PROCEDURE DATE:  09/14/2010     PROCEDURE:  EGD with biopsy, 43239, EGD with biopsy for H. pylori     43239     ASA CLASS:  Class II     INDICATIONS:  iron deficiency anemia, diarrhea           MEDICATIONS:   There was residual sedation effect present from     prior procedure., Versed 2 mg IV     TOPICAL ANESTHETIC:  Exactacain Spray           DESCRIPTION OF PROCEDURE:   After the risks benefits and     alternatives of the procedure were thoroughly explained, informed     consent was obtained.  The LB GIF-H180 [id number] endoscope was     introduced through the mouth and advanced to the second portion of     the duodenum, without limitations.  The instrument was slowly     withdrawn as the mucosa was fully examined.     <<PROCEDUREIMAGES>>           Multiple erosions were found in the antrum. clo bx. done.  Normal     duodenal folds were noted. small bowel biopsies done.  The     esophagus and gastroesophageal junction were completely normal in     appearance.    Retroflexed views revealed no abnormalities.    The     scope was then withdrawn from the patient and the procedure     completed.           COMPLICATIONS:  None           ENDOSCOPIC IMPRESSION:     1) Erosions, multiple in the antrum     2) Normal duodenal folds     3) Normal esophagus     probable NSAID GASTRITIS.R/O H.PYLORI,R/O CELIAC DISEASE.     RECOMMENDATIONS:     1) Await pathology results     2) Rx CLO if positive     3)  continue PPI           REPEAT EXAM:  No           ______________________________     [name]. Jarold Motto, MD, [facility]           CC:  Guerry Bruin, MD           n.     Rosalie Doctor:   [name]. Patterson at 09/14/2010 02:49 PM           Jiles Crocker, [id number]  Note: An exclamation mark (!) indicates a result that was not dispersed into the flowsheet. Document Creation Date: 09/14/2010 2:49 PM _______________________________________________________________________  (1) Order result status: Final Collection or  observation date-time: 09/14/2010 14:39 Requested date-time:  Receipt date-time:  Reported date-time:  Referring Physician:   Ordering Physician: Sheryn Bison [phone]) Specimen Source:  Source: Launa Grill Order Number: [id number] Lab site:

## 2010-11-01 NOTE — Miscellaneous (Signed)
Summary: clotest  Clinical Lists Changes  Orders: Added new Test order of TLB-H Pylori Screen Gastric Biopsy (83013-CLOTEST) - Signed 

## 2010-11-01 NOTE — Procedures (Signed)
Summary: Colonoscopy  Patient: Adrienne [name] Note: All result statuses are Final unless otherwise noted.  Tests: (1) Colonoscopy (COL)   COL Colonoscopy           DONE (C)     [facility]     [address]     [place], [place]  [address]           COLONOSCOPY PROCEDURE REPORT           PATIENT:  Adrienne [name], Adrienne [name]  MR#:  [id number]     BIRTHDATE:  1951/03/26, 59 yrs. old  GENDER:  female     ENDOSCOPIST:  [name]. Jarold Motto, MD, [facility]     REF. BY:     PROCEDURE DATE:  09/14/2010     PROCEDURE:  Higher-risk screening colonoscopy G0105           , Colonoscopy with biopsy     ASA CLASS:  Class II     INDICATIONS:  Colorectal cancer screening, average risk, Iron     Deficiency Anemia     MEDICATIONS:   Fentanyl 100 mcg IV, Versed 10 mg IV           DESCRIPTION OF PROCEDURE:   After the risks benefits and     alternatives of the procedure were thoroughly explained, informed     consent was obtained.  Digital rectal exam was performed and     revealed no abnormalities.   The LB 180AL [id number] endoscope was     introduced through the anus and advanced to the cecum, which was     identified by both the appendix and ileocecal valve, without     limitations.  The quality of the prep was excellent, using     MoviPrep.  The instrument was then slowly withdrawn as the colon     was fully examined.     <<PROCEDUREIMAGES>>           FINDINGS:  No polyps or cancers were seen. RANDOM COLON BIOPSIES     DONE.  This was otherwise a normal examination of the colon.     Retroflexed views in the rectum revealed Unable to retroflex.    The     scope was then withdrawn from the patient and the procedure     completed.           COMPLICATIONS:  None     ENDOSCOPIC IMPRESSION:     1) No polyps or cancers     2) Otherwise normal examination     R/O MICROSCOPIC/COLLAGENOUS COLITIS.     RECOMMENDATIONS:     1) Continue current colorectal screening recommendations for     "routine risk"  patients with a repeat colonoscopy in 10 years.     2) Upper Endoscopy     3) Await biopsy results     REPEAT EXAM:  No           ______________________________     [name]. Jarold Motto, MD, [facility]           CC:  Guerry Bruin, MD           n.     REVISED:  09/14/2010 02:52 PM     eSIGNED:   [name]. Patterson at 09/14/2010 02:52 PM           Jiles Crocker, [id number]  Note: An exclamation mark (!) indicates a result that was not dispersed into the flowsheet. Document Creation Date: 09/14/2010 2:52 PM _______________________________________________________________________  Marland Kitchen  1) Order result status: Final Collection or observation date-time: 09/14/2010 14:27 Requested date-time:  Receipt date-time:  Reported date-time:  Referring Physician:   Ordering Physician: Sheryn Bison [phone]) Specimen Source:  Source: Launa Grill Order Number: [id number] Lab site:   Appended Document: Colonoscopy     Procedures Next Due Date:    Colonoscopy: 08/2020  Appended Document: Colonoscopy 5 year colonoscopy followup for family history of colon cancer.

## 2010-11-01 NOTE — Letter (Signed)
Summary: Patient Notice- Colon Biospy Results  [facility]  [address] [place], [place] [address]   Phone: [phone]  Fax: [phone]        September 18, 2010 MRN: [id number]    Adrienne [name] [address] [place], [place]  [address]    Dear [name],  I am pleased to inform you that the biopsies taken during your recent colonoscopy did not show any evidence of cancer upon pathologic examination.  Additional information/recommendations:  __No further action is needed at this time.  Please follow-up with      your primary care physician for your other healthcare needs.  __Please call [phone] to schedule a return visit to review      your condition.  _x_Continue with the treatment plan as outlined on the day of your      exam.  __You should have a repeat colonoscopy examination for this problem           in 5_ years.Colon biopsies were unremarkable. If you're still having problems with diarrhea, please make an appointment to see me after the holidays. If you're doing well.  Please call us if you are having persistent problems or have questions about your condition that have not been fully answered at this time.  Sincerely,  Mardella Layman MD [facility]   This letter has been electronically signed by your physician.  Appended Document: Patient Notice- Colon Biospy Results Letter mailed

## 2010-11-01 NOTE — Letter (Signed)
Summary: Patient Notice-Endo Biopsy Results  [facility]  [address] [place], [place] [address]   Phone: [phone]  Fax: [phone]        September 18, 2010 MRN: [id number]    TAUNYA GORAL [address] [place], [place]  [address]    Dear Ms. Maese,  I am pleased to inform you that the biopsies taken during your recent endoscopic examination did not show any evidence of cancer upon pathologic examination.  Additional information/recommendations:  __No further action is needed at this time.  Please follow-up with      your primary care physician for your other healthcare needs.  __ Please call [phone] to schedule a return visit to review      your condition.  x__ Continue with the treatment plan as outlined on the day of your      exam.Biopsies for H. pylori infection were negative.  __ You should have a repeat endoscopic examination for this problem              in _ months/years.   Please call us if you are having persistent problems or have questions about your condition that have not been fully answered at this time.  Sincerely,  Mardella Layman MD [facility]  This letter has been electronically signed by your physician.  Appended Document: Patient Notice-Endo Biopsy Results Letter mailed

## 2010-11-27 ENCOUNTER — Ambulatory Visit (INDEPENDENT_AMBULATORY_CARE_PROVIDER_SITE_OTHER): Payer: 59 | Admitting: Psychology

## 2010-11-27 DIAGNOSIS — F411 Generalized anxiety disorder: Secondary | ICD-10-CM

## 2010-12-10 LAB — GLUCOSE, CAPILLARY: Glucose-Capillary: 111 mg/dL — ABNORMAL HIGH (ref 70–99)

## 2010-12-25 ENCOUNTER — Ambulatory Visit: Payer: 59 | Admitting: Psychology

## 2011-01-10 LAB — GLUCOSE, CAPILLARY: Glucose-Capillary: 85 mg/dL (ref 70–99)

## 2011-01-25 ENCOUNTER — Other Ambulatory Visit: Payer: Self-pay | Admitting: *Deleted

## 2011-01-25 MED ORDER — CLONAZEPAM 0.5 MG PO TABS: 0.5000 mg | ORAL_TABLET | Freq: Two times a day (BID) | ORAL | Status: DC | PRN

## 2011-01-28 ENCOUNTER — Other Ambulatory Visit: Payer: Self-pay | Admitting: Obstetrics and Gynecology

## 2011-01-28 DIAGNOSIS — Z09 Encounter for follow-up examination after completed treatment for conditions other than malignant neoplasm: Secondary | ICD-10-CM

## 2011-02-05 ENCOUNTER — Ambulatory Visit
Admission: RE | Admit: 2011-02-05 | Discharge: 2011-02-05 | Disposition: A | Discharge status: Home / Self Care | Payer: 59 | Source: Ambulatory Visit | Attending: Obstetrics and Gynecology | Admitting: Obstetrics and Gynecology

## 2011-02-05 DIAGNOSIS — Z09 Encounter for follow-up examination after completed treatment for conditions other than malignant neoplasm: Secondary | ICD-10-CM

## 2011-02-12 NOTE — Assessment & Plan Note (Signed)
[facility]                         GASTROENTEROLOGY OFFICE NOTE   DAJIAH, KOOI                     MRN:          [id number]  DATE:03/24/2007                            DOB:          08/12/1951    Tera continues with a globus sensation in her throat. 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 She  currently is taking Isorbid 1/2-tablet once a day and Librax 3 times a  day. She is having some dry mouth side effects from the anticholinergic  effect of Librax. She has stopped her Coreg as previously suggested.   Nateisha readily admits that she is under a lot of stress at home with  her relationship with her husband and at work. She is on fluoxetine 40  mg a day. Apparently Dr. Waynard Edwards wants her to get off of this medication.   ASSESSMENT:  Mckinnley has diffuse esophageal spasms and a lot of her  symptoms I do think are stress related.   RECOMMENDATIONS:  I referred her to Dr. Meeker Nation for psychological  counseling and his advice as to whether or not she would do better on a  different antidepressant or perhaps stopping her Librax and trying a  longer acting antianxiety agent such as Klonopin. I have empirically  left her on Nexiumand on Isorbid. I have asked her to use all of her  food and drink at room temperature. She has had a rather extensive prior  gastrointestinal workup which I will not repeat at this time.     [name]. Jarold Motto, MD, [facility], FAGA  Electronically Signed    DRP/MedQ  DD: 03/24/2007  DT: 03/25/2007  Job #: [id number]   cc:   Loraine Leriche A. Perini, M.D.  Kinnie Scales. Dellia Cloud, PhD

## 2011-02-15 NOTE — Assessment & Plan Note (Signed)
[facility]                           GASTROENTEROLOGY OFFICE NOTE   Adrienne [name], Adrienne [name]                     MRN:          [id number]  DATE:06/20/2006                            DOB:          1951/04/11    [name] underwent a esophageal monometry on May 28, 2006.  This showed a  pattern consistent with nutcracker sulcus.  Main amplitude of contraction  in the distal esophagus was over 250 mmHg with prolonged peristaltic waves  greater than 6 seconds in duration.  The peristalsis was normal as was lower  esophageal sphincter pressure.   Because of her persistent pain I placed her on sublingual Levsin 3 times a  day before meals and I have discontinued her metoclopramide.  She has been  unable to tolerate Levsin at full strength because of dry mouth.  In close  question today she definitely has an anxiety component to her chest pain and  I have therefore switched her to Librax three times a day, 30 minutes before  meals and we will let her try Imdur 30 mg 1/2 a tablet every morning q. a.m.  as tolerated.  I have asked her to take all of her food and liquids at room  temperature if possible and to continue her daily Nexium therapy because of  previous evidence of acid reflux with attempting stricture of her esophagus  which may be playing somewhat of a role in her esophageal spasm.  Her last  endoscopic exam was completed on May 12, 2006 and biopsies at that time  showed no evidence of Barrett's mucosa.  I will see her back in one months  time for followup.                                   [name]. Jarold Motto, MD, Clementeen Graham, [place]   DRP/MedQ  DD:  06/20/2006  DT:  06/23/2006  Job #:  [id number]   cc:   Gaspar Garbe, M.D.  Antony Contras, MD

## 2011-02-15 NOTE — Op Note (Signed)
NAMEABBAGALE, GOGUEN NO.:  [id number]   MEDICAL RECORD NO.:  [id number]          PATIENT TYPE:  AMB   LOCATION:  ENDO                         FACILITY:  [facility]   PHYSICIAN:  [name]. Jarold Motto, MD, Clementeen Graham, [place], FAGA DATE OF BIRTH: [birth year]   DATE OF PROCEDURE:  05/28/2006  DATE OF DISCHARGE:  05/28/2006                                 OPERATIVE REPORT   Esophageal manometry was completed on May 28, 2006.  Results are as  follows:   1. Upper esophageal sphincter - appears to be normal coordination between      pharyngeal contraction and cricopharyngeal relaxation.  2. Lower esophageal sphincter - mean pressure is normal at 25 mmHg with      normal relaxation and swallowing.  3. Motility pattern -  there are normally propagated peristaltic waves      throughout the leak of the esophagus.  However, the mean amplitude of      contraction in distal esophagus is 250 mmHg and the mean duration is      abnormal, mean greater than 6 seconds.   ASSESSMENT:  Esophageal manometry is consistent with a variant of diffuse  esophageal spasm, so called nutcracker esophagus.   RECOMMENDATIONS:  This patient does not appear to have acid reflux to cause  for chest pain but seems to have esophageal motility disorder.  We will  change her medications and try nitrates and antispasmodics as an outpatient.      [name]. Jarold Motto, MD, Caleen Essex, FAGA  Electronically Signed     DRP/MEDQ  D:  06/05/2006  T:  06/05/2006  Job:  [id number]

## 2011-02-15 NOTE — Assessment & Plan Note (Signed)
Adrienne [name]                           Adrienne [name]   Adrienne [name], Adrienne [name]                     MRN:          [id number]  DATE:04/15/2006                            DOB:          1951-04-25    HISTORY OF PRESENT ILLNESS:  [name] is having recurrent reflux symptoms  despite taking Nexium 40 mg 2-3 times a day and Reglan 10 mg 3 times a day  per Dr. Wylene Simmer and Dr. Jenne Pane (ENT).  She has abdominal gas and bloating and  chronic recurrent diarrhea perhaps as bacterial overgrowth syndrome  associated with her adult onset diabetes.  She has had thorough GI workups  otherwise and there has been some suspicion of low grade pancreatitis  although this has not been confirmed.  She feels that a lot of her symptoms  are stress related and she has IBS controlled by Citrucel, avoidance of  fructose and sorbitol in her diet.  She has no definite lactose intolerance.   Her reflux is rather typical with burning substernal chest pain and she has  had recent progressive solid food dysphagia.  Her last endoscopic exam was  performed in August, 2000.  At that time, she had a rather prominent hiatal  hernia.  Small bowel biopsy at that time for celiac disease was  unremarkable.  On review of her chart it shows extensive GI evaluation  otherwise which has been unremarkable.  The last ultrasound in our office  was in October, 2001, but she relates that she has had a more recent  ultrasound performed within the last 2 years which has been unremarkable.  She denies abuse of alcohol or cigarettes.   MEDICATIONS:  She is on multiple other medications listed and reviewed in  her chart that include:  1.  Fluoxetine 40 mg a day.  2.  Zyrtec 10 mg a day.  3.  Avandamet 1,000 mg twice a day.  4.  Zocor 40 mg a day.  5.  Imipramine hydrochloride 25 mg a day.  6.  Coreg 25 mg a day.  7.  Altace 10 mg a day.  8.  Nexium 40 mg 2-3 times a day.  9.  Aspirin 81 mg a  day.  10. Reglan 10 mg t.i.d.  11. Citrucel tablets daily.  12. Calcium twice a day.  13. P.R.N. Ultram.  14. P.R.N. Imodium.   ALLERGIES:  She denies drug allergies.   PHYSICAL EXAMINATION:  GENERAL:  She is a healthy appearing white female  appearing her stated age in no acute distress.  I cannot appreciate stigmata  of chronic liver disease.  CHEST:  Clear to percussion and auscultation.  CARDIAC:  She appeared to be in a regular rhythm without significant  murmurs, gallops or rubs.  ABDOMEN:  Showed no organomegaly, masses or tenderness or significant  distention.  Bowel sounds were normal.  Rectal exam was deferred.  EXTREMITIES:  Unremarkable.  MENTAL STATUS:  Normal.   ASSESSMENT:  1.  Worsening acid reflux with probable peptid stricture of her esophagus.  2.  Possible diabetic gut, autonomic enteropathy with associated  gastroparesis, and also bacterial overgrowth syndrome.  3.  History of hyperlipidemia.  4.  Adult onset diabetes mellitus.  5.  History of irritable bowel syndrome.   RECOMMENDATIONS:  1.  Repeat endoscopy and esophageal dilatation.  2.  Continue reflux regimen but we will discontinue Reglan at this time.  3.  Empiric course of Xifaxan 400 mg t.i.d. for 10 days along with Align      probiotic therapy.  4.  Continue other medications as per Dr. Wylene Simmer.  5.  Obtain repeat small bowel biopsy at time of endoscopy.  Also, esophageal      biopsy to exclude eosinophilic esophagitis.                                   [name]. Jarold Motto, MD, Clementeen Graham, [place]   DRP/MedQ  DD:  04/15/2006  DT:  04/16/2006  Job #:  [id number]   cc:   Gaspar Garbe, MD  Antony Contras, MD

## 2011-02-15 NOTE — Assessment & Plan Note (Signed)
[name]                           GASTROENTEROLOGY OFFICE NOTE   [name], Adrienne [name]                     MRN:          [id number]  DATE:07/09/2006                            DOB:          1951/01/18    [name] was doing well on low dose Imdur along with Levsin before meals.  She actually went on vacation without problems but returned to work and was  under stress, had some problems with dizziness and mild hypotension.  She  has given me a set of blood pressure readings with her blood pressure  dropping as low as 85/45 with a rather slow pulse secondary to her Coreg  drug use, 25 mg at bedtime.  She has a history of essential hypertension.  Also has been on Altace 10 mg a day.  Has been holding this for the last 24  hours, as per Dr. Laurey Morale office.  I have switched her from Levsin to  Librax three times a day because of obvious stress-related problems in her  life contributing to all of her GI symptoms, including esophageal spasm and  irritable bowel syndrome.   Her weight today is 165 pounds.  Blood pressure is 100/58 and pulse was 70  and regular.  General physical exam was not performed.   RECOMMENDATIONS:  I think that [name] may do better perhaps stopping her  Coreg because of its beta blocker effect, in terms of having to use a  nitrate for control of her esophageal spasm.  She obviously had rather good  response to low dose Imdur.  I have asked her to continue to check her blood  pressures at work and to go by her symptomatology of dizziness and  orthostasis as to whether or not she can tolerate Imdur.  Obviously, stress  is a bit part of her problems, and I have given her Dr. Dawayne Cirri number  so she can call him for stress management control.  Otherwise, I will see  her back in one month's time for followup.  She does take twice daily Nexium  for a history also of acid reflux and on reviewing her chart, she in the  past has had  esophageal biopsies, which have not showed evidence of  Barrett's mucosa.  She has also had several upper abdominal ultrasound exams  which have been unremarkable.  It is unclear as to whether or not she really  needs to maintain long term Nexium, but I will not change that at this time.       [name]. Jarold Motto, MD, Clementeen Graham, [place]      DRP/MedQ  DD:  07/09/2006  DT:  07/10/2006  Job #:  [id number]   cc:   Loraine Leriche A. Perini, M.D.  Kinnie Scales. Dellia Cloud, PhD

## 2011-07-30 ENCOUNTER — Other Ambulatory Visit: Payer: Self-pay | Admitting: Obstetrics and Gynecology

## 2011-07-30 DIAGNOSIS — Z1231 Encounter for screening mammogram for malignant neoplasm of breast: Secondary | ICD-10-CM

## 2011-08-20 ENCOUNTER — Ambulatory Visit
Admission: RE | Admit: 2011-08-20 | Discharge: 2011-08-20 | Disposition: A | Discharge status: Home / Self Care | Payer: BC Managed Care – PPO | Source: Ambulatory Visit | Attending: Obstetrics and Gynecology | Admitting: Obstetrics and Gynecology

## 2011-08-20 DIAGNOSIS — Z1231 Encounter for screening mammogram for malignant neoplasm of breast: Secondary | ICD-10-CM

## 2011-08-26 ENCOUNTER — Other Ambulatory Visit: Payer: Self-pay | Admitting: Obstetrics and Gynecology

## 2011-08-26 DIAGNOSIS — R928 Other abnormal and inconclusive findings on diagnostic imaging of breast: Secondary | ICD-10-CM

## 2011-09-10 ENCOUNTER — Other Ambulatory Visit: Payer: Self-pay | Admitting: Gastroenterology

## 2011-09-13 ENCOUNTER — Ambulatory Visit
Admission: RE | Admit: 2011-09-13 | Discharge: 2011-09-13 | Disposition: A | Discharge status: Home / Self Care | Payer: BC Managed Care – PPO | Source: Ambulatory Visit | Attending: Obstetrics and Gynecology | Admitting: Obstetrics and Gynecology

## 2011-09-13 ENCOUNTER — Other Ambulatory Visit: Payer: Self-pay | Admitting: Obstetrics and Gynecology

## 2011-09-13 DIAGNOSIS — R928 Other abnormal and inconclusive findings on diagnostic imaging of breast: Secondary | ICD-10-CM

## 2011-09-26 ENCOUNTER — Ambulatory Visit
Admission: RE | Admit: 2011-09-26 | Discharge: 2011-09-26 | Disposition: A | Discharge status: Home / Self Care | Payer: BC Managed Care – PPO | Source: Ambulatory Visit | Attending: Obstetrics and Gynecology | Admitting: Obstetrics and Gynecology

## 2011-09-26 ENCOUNTER — Other Ambulatory Visit: Payer: Self-pay | Admitting: Obstetrics and Gynecology

## 2011-09-26 DIAGNOSIS — N6320 Unspecified lump in the left breast, unspecified quadrant: Secondary | ICD-10-CM

## 2011-09-26 DIAGNOSIS — R921 Mammographic calcification found on diagnostic imaging of breast: Secondary | ICD-10-CM

## 2011-09-26 DIAGNOSIS — R928 Other abnormal and inconclusive findings on diagnostic imaging of breast: Secondary | ICD-10-CM

## 2011-09-27 ENCOUNTER — Inpatient Hospital Stay: Admission: RE | Admit: 2011-09-27 | Payer: BC Managed Care – PPO | Source: Ambulatory Visit

## 2011-12-02 ENCOUNTER — Encounter: Payer: Self-pay | Admitting: *Deleted

## 2011-12-05 ENCOUNTER — Encounter: Payer: Self-pay | Admitting: Gastroenterology

## 2011-12-05 ENCOUNTER — Ambulatory Visit (INDEPENDENT_AMBULATORY_CARE_PROVIDER_SITE_OTHER): Payer: BC Managed Care – PPO | Admitting: Gastroenterology

## 2011-12-05 VITALS — BP 124/60 | HR 80 | Ht 61.0 in | Wt 171.0 lb

## 2011-12-05 DIAGNOSIS — K589 Irritable bowel syndrome without diarrhea: Secondary | ICD-10-CM

## 2011-12-05 DIAGNOSIS — F419 Anxiety disorder, unspecified: Secondary | ICD-10-CM

## 2011-12-05 DIAGNOSIS — K219 Gastro-esophageal reflux disease without esophagitis: Secondary | ICD-10-CM

## 2011-12-05 DIAGNOSIS — F411 Generalized anxiety disorder: Secondary | ICD-10-CM

## 2011-12-05 MED ORDER — CLONAZEPAM 0.5 MG PO TABS: ORAL_TABLET | ORAL | Status: DC

## 2011-12-05 MED ORDER — HYOSCYAMINE-PHENYLTOLOXAMINE 0.0625-15 MG PO CAPS: 1.0000 | ORAL_CAPSULE | Freq: Two times a day (BID) | ORAL | Status: DC

## 2011-12-05 NOTE — Patient Instructions (Signed)
We have sent the following medications to your pharmacy for you to pick up at your convenience:  Klonopin, Digex  Please refer to the diet information we have given you.

## 2011-12-05 NOTE — Progress Notes (Signed)
This is a [age]-year-old Caucasian female with chronic anxiety issues on Klonopin 0.5 mg twice a day for this problem and esophageal spasm. She also takes Nexium 40 mg daily for GERD. She continues to complain of" loose stools" with some urgency and cramping lower abdominal pain. She is lactose intolerant denies use of sorbitol or fructose. Prior GI evaluations have been negative including colonoscopy with multiple biopsies and endoscopy and small bowel biopsy. She has had no anorexia, weight loss, systemic or hepatobiliary complaints. She complains of extremely foul flatus  Current Medications, Allergies, Past Medical History, Past Surgical History, Family History and Social History were reviewed in [employer] record.  Pertinent Review of Systems Negative   Physical Exam: The patient with blood pressure 124/60 and pulse 80 and regular. BMI 32.31 chest is clear and she is a regular rhythm without murmurs gallops or rubs. There is no organomegaly, abdominal masses or tenderness. Bowel sounds are normal. Mental status is normal, peripheral extremities unremarkable.    Assessment and Plan: Diarrhea predominant IBS. I placed her on Digex NF,FODMAP diet for IBS predominant diarrhea, and she is to call for progress report in one to 2 weeks. Klonopin prescription renewed. We will try to taper off this medication once her gut problems have improved hopefully. I did extensive review today of her labs, radiographs, and endoscopic procedures.  Encounter Diagnosis  Name Primary?  . IBS (irritable bowel syndrome) Yes

## 2012-01-14 ENCOUNTER — Telehealth: Payer: Self-pay | Admitting: Gastroenterology

## 2012-01-15 NOTE — Telephone Encounter (Signed)
Pt reports she is taking Digex BID as ordered. On occasions she still has constipation followed by diarrhea and urgency so bad she soils her clothes. She also has cramping when she has the diarrhea. She is trying to follow the FOD MAP diet, but admits to eating some of the foods at times. Pt wants to increase the DIGEX; please advise. Thanks.

## 2012-01-15 NOTE — Telephone Encounter (Signed)
Spoke with Mike Gip, PA and informed pt to try taking 1 Digex with each meal or TID; she will call back for questions or problems.

## 2012-01-15 NOTE — Telephone Encounter (Signed)
Forward to Dr,. Jarold Motto

## 2012-02-12 ENCOUNTER — Other Ambulatory Visit: Payer: Self-pay | Admitting: *Deleted

## 2012-02-12 MED ORDER — CLONAZEPAM 0.5 MG PO TABS: ORAL_TABLET | ORAL | Status: DC

## 2012-04-15 ENCOUNTER — Other Ambulatory Visit: Payer: Self-pay | Admitting: Gastroenterology

## 2012-07-13 ENCOUNTER — Other Ambulatory Visit: Payer: Self-pay | Admitting: Obstetrics and Gynecology

## 2012-07-13 DIAGNOSIS — Z1231 Encounter for screening mammogram for malignant neoplasm of breast: Secondary | ICD-10-CM

## 2012-08-20 ENCOUNTER — Ambulatory Visit
Admission: RE | Admit: 2012-08-20 | Discharge: 2012-08-20 | Disposition: A | Discharge status: Home / Self Care | Payer: BC Managed Care – PPO | Source: Ambulatory Visit | Attending: Obstetrics and Gynecology | Admitting: Obstetrics and Gynecology

## 2012-08-20 DIAGNOSIS — Z1231 Encounter for screening mammogram for malignant neoplasm of breast: Secondary | ICD-10-CM

## 2012-09-16 ENCOUNTER — Other Ambulatory Visit: Payer: Self-pay | Admitting: Gastroenterology

## 2012-10-15 ENCOUNTER — Other Ambulatory Visit: Payer: Self-pay | Admitting: *Deleted

## 2012-10-15 NOTE — Telephone Encounter (Signed)
Assessment and Plan: Diarrhea predominant IBS. I placed her on Digex NF,FODMAP diet for IBS predominant diarrhea, and she is to call for progress report in one to 2 weeks. Klonopin prescription renewed. We will try to taper off this medication once her gut problems have improved hopefully. I did extensive review today of her labs, radiographs, and endoscopic procedures.   Did you want to refill her Klonopin?

## 2012-10-16 MED ORDER — CLONAZEPAM 0.5 MG PO TABS: ORAL_TABLET | ORAL | Status: DC

## 2012-10-16 NOTE — Telephone Encounter (Signed)
.  5 mg bid,renew  x3.

## 2013-02-05 ENCOUNTER — Ambulatory Visit (INDEPENDENT_AMBULATORY_CARE_PROVIDER_SITE_OTHER): Payer: BC Managed Care – PPO | Admitting: Nurse Practitioner

## 2013-02-05 ENCOUNTER — Telehealth: Payer: Self-pay | Admitting: Gastroenterology

## 2013-02-05 ENCOUNTER — Encounter: Payer: Self-pay | Admitting: Nurse Practitioner

## 2013-02-05 VITALS — BP 110/68 | HR 104 | Ht 61.0 in | Wt 171.6 lb

## 2013-02-05 DIAGNOSIS — Z8719 Personal history of other diseases of the digestive system: Secondary | ICD-10-CM

## 2013-02-05 DIAGNOSIS — K648 Other hemorrhoids: Secondary | ICD-10-CM | POA: Insufficient documentation

## 2013-02-05 DIAGNOSIS — K589 Irritable bowel syndrome without diarrhea: Secondary | ICD-10-CM

## 2013-02-05 MED ORDER — CLONAZEPAM 0.5 MG PO TABS: ORAL_TABLET | ORAL | Status: DC

## 2013-02-05 MED ORDER — HYOSCYAMINE SULFATE 0.125 MG SL SUBL: 0.1250 mg | SUBLINGUAL_TABLET | SUBLINGUAL | Status: DC | PRN

## 2013-02-05 MED ORDER — HYDROCORTISONE ACETATE 25 MG RE SUPP: 25.0000 mg | Freq: Two times a day (BID) | RECTAL | Status: DC

## 2013-02-05 NOTE — Patient Instructions (Addendum)
We have given you a prescription for Clonazepam. We sent prescriptions for Anusol HC Suppositories and Levsin to [facility]. Call if recurrent bleeding after the hemorrhoid treatment.  Avoid constipation. Use Miralax,( 17 grams in juice or water)  if no bowel movement in 2 days.  Follow up with Dr. Jarold Motto on 04-09-2013 at 9:45 Am.

## 2013-02-05 NOTE — Telephone Encounter (Signed)
Patient had several episodes of rectal bleeding last after having diarrhea yesterday.  No bleeding this am, but she is concerned.  She will come in today and see Willette Cluster RNP at 2:00

## 2013-02-05 NOTE — Progress Notes (Signed)
  History of Present Illness:  Patient is a 62 year old female known to Dr. Jarold Motto. She has a history of esophageal spasms / GERD. In the past she has had problems with loose stool and gives a history of being lactose intolerant. Colonoscopy with multiple biopsies and endoscopy with small bowel biopsies have been negative. Patient was last seen March of last year for evaluation of her diarrhea predominant irritable bowel syndrome. She was given a FODMAP diet  and Digex. She is worked in today for evaluation of rectal bleeding.Yesterday she had a typical IBS flare with diarrhea and abdominal pain. She noticed some bright red blood witih BM twice yesterday and a smaller amount of blood this am. No rectal pain.   Current Medications, Allergies, Past Medical History, Past Surgical History, Family History and Social History were reviewed in [employer] record.  Physical Exam: General: Well developed , white female in no acute distress Head: Normocephalic and atraumatic Eyes:  sclerae anicteric, conjunctiva pink  Ears: Normal auditory acuity Lungs: Clear throughout to auscultation Heart: Regular rate and rhythm Abdomen: Soft, non distended, non-tender. No masses, no hepatomegaly. Normal bowel sounds Rectal: No external lesions. On anoscopy she has friable internal hemorrhoids.  Musculoskeletal: Symmetrical with no gross deformities  Extremities: No edema  Neurological: Alert oriented x 4, grossly nonfocal Psychological:  Alert and cooperative. Normal mood and affect  Assessment and Recommendations:  1 Internal hemorrhoids. Likely source of bleeding. Anusol HC suppositories BID and 7 days. Avoid constipation. Call us in two weeks with condition update.  2. Diarrhea predominant IBS. She still gets constipation from time to time. Sometimes does not have a BM for 3-4 days. Advised patient her to use Miralax as needed if no BM in 2 days as constipation will exacerbate  hemorrhoids. Follow up with primary GI in 2-3 months, or sooner if needed.  3. Anxiety, asks for refill on Klonazepam. Refill given.

## 2013-04-09 ENCOUNTER — Ambulatory Visit (INDEPENDENT_AMBULATORY_CARE_PROVIDER_SITE_OTHER): Payer: BC Managed Care – PPO | Admitting: Gastroenterology

## 2013-04-09 ENCOUNTER — Encounter: Payer: Self-pay | Admitting: Gastroenterology

## 2013-04-09 VITALS — BP 110/80 | HR 83 | Ht 61.0 in | Wt 171.0 lb

## 2013-04-09 DIAGNOSIS — R197 Diarrhea, unspecified: Secondary | ICD-10-CM

## 2013-04-09 DIAGNOSIS — K589 Irritable bowel syndrome without diarrhea: Secondary | ICD-10-CM

## 2013-04-09 NOTE — Progress Notes (Signed)
History of Present Illness: This is a 62 year old Caucasian female with rather classic diarrhea predominant IBS and incomplete rectal emptying.  She recently had some hemorrhoidal bleeding which is resolved topical and local anal care.  She continues with crampy lower abdominal pain and periodic loose watery stools.  She's had a complete GI workup which otherwise is been negative.  Her diarrhea is related to stress, she is on Klonopin therapy.  She is on a FOD-MAP  Diet with when necessary Levsin use.  She has not had cholecystectomy.  Other medications include Nexium 40 mg a day for acid reflux and aspirin 81 mg a day.  She denies upper GI or hepatobiliary complaints at this time.    Current Medications, Allergies, Past Medical History, Past Surgical History, Family History and Social History were reviewed in [employer] record.  ROS: All systems were reviewed and are negative unless otherwise stated in the HPI.         PE::: Blood pressure 110/80, pulse 83 and regular weight 171 with a BMI of 32.33.  Abdominal exam shows no organomegaly, masses or tenderness.  Bowel sounds are normal.  Rectal exam is deferred.  Assessment and Plan: This patient has diarrhea predominant IBS and sigmoid spasm.  I have asked her to try Benefiber 1 tablespoon in her food twice a day with when necessary Levsin use.  Her diarrhea does not seem severe enough to warrant Lotronex therapy.  She is to continue her dietary restrictions as previously reviewed.  She may need referral for counseling about her chronic anxiety syndrome. No diagnosis found.

## 2013-04-09 NOTE — Patient Instructions (Signed)
  Please purchase Benefiber over the counter and take one tablespoon twice daily.  Please call Dr. Norval Gable nurse Aram Beecham, in two weeks and give her an update on how you are feeling. ______________________________________________________________________________                                               We are excited to introduce MyChart, a new best-in-class service that provides you online access to important information in your electronic medical record. We want to make it easier for you to view your health information - all in one secure location - when and where you need it. We expect MyChart will enhance the quality of care and service we provide.  When you register for MyChart, you can:    View your test results.    Request appointments and receive appointment reminders via email.    Request medication renewals.    View your medical history, allergies, medications and immunizations.    Communicate with your physician's office through a password-protected site.    Conveniently print information such as your medication lists.  To find out if MyChart is right for you, please talk to a member of our clinical staff today. We will gladly answer your questions about this free health and wellness tool.  If you are age 45 or older and want a member of your family to have access to your record, you must provide written consent by completing a proxy form available at our office. Please speak to our clinical staff about guidelines regarding accounts for patients younger than age 63.  As you activate your MyChart account and need any technical assistance, please call the MyChart technical support line at [phone]-CHART [phone]) or email your question to [email or web address]. If you email your question(s), please include your name, a return phone number and the best time to reach you.  If you have non-urgent health-related questions, you can send a message to our office through  MyChart at [email or web address]. If you have a medical emergency, call 911.  Thank you for using MyChart as your new health and wellness resource!   MyChart licensed from [employer],  4540-9811. Patents Pending.

## 2013-05-24 ENCOUNTER — Other Ambulatory Visit: Payer: Self-pay | Admitting: Nurse Practitioner

## 2013-05-25 ENCOUNTER — Other Ambulatory Visit: Payer: Self-pay | Admitting: *Deleted

## 2013-05-25 MED ORDER — CLONAZEPAM 0.5 MG PO TABS: ORAL_TABLET | ORAL | Status: DC

## 2013-05-26 ENCOUNTER — Telehealth: Payer: Self-pay | Admitting: *Deleted

## 2013-05-26 NOTE — Telephone Encounter (Signed)
Dr. Jarold Motto saw this patient on 04-29-2013 and he agreed to ordering a prescription for the clonazepam # 60 with no refills.  I printed the prescription and he signed it.  I faxed it to the pharmacy on 05-25-2013. ( [facility] )

## 2013-05-27 ENCOUNTER — Other Ambulatory Visit: Payer: Self-pay | Admitting: *Deleted

## 2013-05-27 MED ORDER — HYOSCYAMINE SULFATE 0.125 MG SL SUBL: 0.1250 mg | SUBLINGUAL_TABLET | SUBLINGUAL | Status: DC | PRN

## 2013-07-20 ENCOUNTER — Other Ambulatory Visit: Payer: Self-pay

## 2013-07-20 DIAGNOSIS — Z1231 Encounter for screening mammogram for malignant neoplasm of breast: Secondary | ICD-10-CM

## 2013-07-26 ENCOUNTER — Telehealth: Payer: Self-pay | Admitting: *Deleted

## 2013-07-26 DIAGNOSIS — F411 Generalized anxiety disorder: Secondary | ICD-10-CM

## 2013-07-26 MED ORDER — CLONAZEPAM 0.5 MG PO TABS: ORAL_TABLET | ORAL | Status: DC

## 2013-07-26 NOTE — Telephone Encounter (Signed)
Patient called back, I advised patient that per Dr. Jarold Motto she needs appointment with Dr. Dellia Cloud and Dr. Jarold Motto will not do any more refills on the Klonopin. I gave patient their phone number and put referral in. I advised patient that their office will be able to see the notes from Dr. Jarold Motto. Patient verbalized understanding.

## 2013-07-26 NOTE — Telephone Encounter (Signed)
Yes but needs to see DR, Dellia Cloud in psychology.

## 2013-07-26 NOTE — Telephone Encounter (Signed)
Called patient and left message on vm for patient to call back Per Dr. Jarold Motto patient needs to make an appointment with Dr. Newt Minion   Medication faxed

## 2013-07-26 NOTE — Telephone Encounter (Signed)
Patient needs refill on Clonazepam, ok to refill?

## 2013-08-23 ENCOUNTER — Ambulatory Visit
Admission: RE | Admit: 2013-08-23 | Discharge: 2013-08-23 | Disposition: A | Discharge status: Home / Self Care | Payer: BC Managed Care – PPO | Source: Ambulatory Visit

## 2013-08-23 DIAGNOSIS — Z1231 Encounter for screening mammogram for malignant neoplasm of breast: Secondary | ICD-10-CM

## 2013-10-14 ENCOUNTER — Encounter: Payer: Self-pay | Admitting: Gastroenterology

## 2013-10-14 ENCOUNTER — Ambulatory Visit (INDEPENDENT_AMBULATORY_CARE_PROVIDER_SITE_OTHER): Payer: BC Managed Care – PPO | Admitting: Gastroenterology

## 2013-10-14 VITALS — BP 110/68 | HR 76 | Ht 61.0 in | Wt 165.2 lb

## 2013-10-14 DIAGNOSIS — R1319 Other dysphagia: Secondary | ICD-10-CM

## 2013-10-14 DIAGNOSIS — R141 Gas pain: Secondary | ICD-10-CM

## 2013-10-14 DIAGNOSIS — E119 Type 2 diabetes mellitus without complications: Secondary | ICD-10-CM

## 2013-10-14 DIAGNOSIS — Z8719 Personal history of other diseases of the digestive system: Secondary | ICD-10-CM

## 2013-10-14 DIAGNOSIS — R197 Diarrhea, unspecified: Secondary | ICD-10-CM

## 2013-10-14 DIAGNOSIS — R143 Flatulence: Secondary | ICD-10-CM

## 2013-10-14 DIAGNOSIS — F411 Generalized anxiety disorder: Secondary | ICD-10-CM

## 2013-10-14 DIAGNOSIS — K589 Irritable bowel syndrome without diarrhea: Secondary | ICD-10-CM

## 2013-10-14 DIAGNOSIS — R142 Eructation: Secondary | ICD-10-CM

## 2013-10-14 DIAGNOSIS — K219 Gastro-esophageal reflux disease without esophagitis: Secondary | ICD-10-CM

## 2013-10-14 DIAGNOSIS — R140 Abdominal distension (gaseous): Secondary | ICD-10-CM

## 2013-10-14 NOTE — Patient Instructions (Signed)
You have been scheduled for an endoscopy with propofol. Please follow written instructions given to you at your visit today. If you use inhalers (even only as needed), please bring them with you on the day of your procedure. Your physician has requested that you go to www.startemmi.com and enter the access code given to you at your visit today. This web site gives a general overview about your procedure. However, you should still follow specific instructions given to you by our office regarding your preparation for the procedure.  We have given you samples of the following medication to take: VSL # 3, please take two capsules by mouth for a month ([place])  FOD MAP Diet given today for your review

## 2013-10-14 NOTE — Progress Notes (Signed)
This is a 63 year old Caucasian female with 20 years of IBS, diarrhea predominant.  She now complains of gas, bloating, and periodic diarrhea resulted after using lactose products of nonabsorbable carbohydrates.  She uses when necessary antispasmodics with relief.  She does not have episodes of constipation.  She has chronic GERD and is on daily Nexium therapy but complains of solid food dysphagia in her distal substernal area bradycardia meets.  Her last endoscopy was 2 years ago.  Previous biopsies have shown no evidence of celiac disease, H. pylori infection, or microscopic colitis.  Despite all these complaints her appetite is good her weight is stable.  She specifically denies melena, hematochezia, fever, chills, or systemic complaints.  She also denies abuse of alcohol, cigarettes, or NSAIDs.  Family history is noncontributory.  Current Medications, Allergies, Past Medical History, Past Surgical History, Family History and Social History were reviewed in [employer] record.  ROS: All systems were reviewed and are negative unless otherwise stated in the HPI.          Physical Exam: Healthy-appearing patient in no distress.  Blood pressure 110/68, pulse 76 and regular and weight 165 the BMI of 31.23.  Chest is clear and cardiac exam is unremarkable.  There is no hepatosplenomegaly, abdominal masses or tenderness.  Bowel sounds are normal.  Mental status is normal.  Peripheral extremities are unremarkable.  I cannot appreciate stigmata of chronic liver disease.      Assessment and Plan: Diarrhea predominant IBS exacerbated by malabsorption of nonabsorbable carbohydrates and lactose.  I have placed her on a FOD_MAP diet for IBS, also VSL#3 probiotics twice a day for one month trial, and scheduled endoscopy with probable esophageal dilatation per chronic GERD and current dysphagia.  She may need a trial of Lotronex therapy if her diarrhea persists.  She is up-to-date on her  colonoscopy exam.  Previous esophageal manometry in 2007 showed variation of nutcracker esophagus.  It may be her dysphagia is on the basis of an esophageal motility disorder.  Previous tumor for possible colitis has been negative and she has had negative serologic markers for IBD, and negative colon biopsies.  Also because of her chronic diarrhea she's had trials previously pancreatic extracts and also treatment with Xifaxan for 10 days for possible bacterial overgrowth syndrome been on which may be difference in her diarrhea state.  Previous ultrasound exams have not shown evidence of cholelithiasis.  She has previously had psychological counseling with Dr. Cheryln Manly.  She is on medication for adult onset diabetes, daily Nexium, when necessary Hyostamine, imipramine at bedtime, when necessary Isorbid for chest pain, and simvastatin and ramipril, she is under the care of Dr. Rosana Hoes [facility].  I've urged her to take her other medications as listed and reviewed.

## 2013-10-18 ENCOUNTER — Encounter: Payer: Self-pay | Admitting: Gastroenterology

## 2013-10-18 ENCOUNTER — Ambulatory Visit (AMBULATORY_SURGERY_CENTER): Payer: BC Managed Care – PPO | Admitting: Gastroenterology

## 2013-10-18 VITALS — BP 154/89 | HR 87 | Temp 97.0°F | Resp 11 | Ht 60.0 in | Wt 165.0 lb

## 2013-10-18 DIAGNOSIS — K2970 Gastritis, unspecified, without bleeding: Secondary | ICD-10-CM

## 2013-10-18 DIAGNOSIS — K222 Esophageal obstruction: Secondary | ICD-10-CM

## 2013-10-18 DIAGNOSIS — R1319 Other dysphagia: Secondary | ICD-10-CM

## 2013-10-18 DIAGNOSIS — K219 Gastro-esophageal reflux disease without esophagitis: Secondary | ICD-10-CM

## 2013-10-18 DIAGNOSIS — K2990 Gastroduodenitis, unspecified, without bleeding: Secondary | ICD-10-CM

## 2013-10-18 HISTORY — PX: UPPER GASTROINTESTINAL ENDOSCOPY: SHX188

## 2013-10-18 MED ORDER — SODIUM CHLORIDE 0.9 % IV SOLN: 500.0000 mL | INTRAVENOUS | Status: DC

## 2013-10-18 MED ORDER — DEXTROSE 5 % IV SOLN: INTRAVENOUS | Status: DC

## 2013-10-18 NOTE — Progress Notes (Signed)
Procedure ends, to recovery, report given and VSS. 

## 2013-10-18 NOTE — Progress Notes (Signed)
Called to room to assist during endoscopic procedure.  Patient ID and intended procedure confirmed with present staff. Received instructions for my participation in the procedure from the performing physician.  

## 2013-10-18 NOTE — Patient Instructions (Signed)
YOU HAD AN ENDOSCOPIC PROCEDURE TODAY AT [facility]: Refer to the procedure report that was given to you for any specific questions about what was found during the examination.  If the procedure report does not answer your questions, please call your gastroenterologist to clarify.  If you requested that your care partner not be given the details of your procedure findings, then the procedure report has been included in a sealed envelope for you to review at your convenience later.  YOU SHOULD EXPECT: Some feelings of bloating in the abdomen. Passage of more gas than usual.  Walking can help get rid of the air that was put into your GI tract during the procedure and reduce the bloating. If you had a lower endoscopy (such as a colonoscopy or flexible sigmoidoscopy) you may notice spotting of blood in your stool or on the toilet paper. If you underwent a bowel prep for your procedure, then you may not have a normal bowel movement for a few days.  DIET: In one hour,  Around 5:30pm, you may have some water.   IF you can tolerate that, you may proceed to a soft diet for the rest of the day.  Tomorrow you may have a normal diet. ACTIVITY: Your care partner should take you home directly after the procedure.  You should plan to take it easy, moving slowly for the rest of the day.  You can resume normal activity the day after the procedure however you should NOT DRIVE or use heavy machinery for 24 hours (because of the sedation medicines used during the test).    SYMPTOMS TO REPORT IMMEDIATELY: A gastroenterologist can be reached at any hour.  During normal business hours, 8:30 AM to 5:00 PM Monday through Friday, call [phone].  After hours and on weekends, please call the GI answering service at [phone] who will take a message and have the physician on call contact you.   Following upper endoscopy (EGD)  Vomiting of blood or coffee ground material  New chest pain or pain under the  shoulder blades  Painful or persistently difficult swallowing  New shortness of breath  Fever of 100F or higher  Black, tarry-looking stools  FOLLOW UP: If any biopsies were taken you will be contacted by phone or by letter within the next 1-3 weeks.  Call your gastroenterologist if you have not heard about the biopsies in 3 weeks.  Our staff will call the home number listed on your records the next business day following your procedure to check on you and address any questions or concerns that you may have at that time regarding the information given to you following your procedure. This is a courtesy call and so if there is no answer at the home number and we have not heard from you through the emergency physician on call, we will assume that you have returned to your regular daily activities without incident.  SIGNATURES/CONFIDENTIALITY: You and/or your care partner have signed paperwork which will be entered into your electronic medical record.  These signatures attest to the fact that that the information above on your After Visit Summary has been reviewed and is understood.  Full responsibility of the confidentiality of this discharge information lies with you and/or your care-partner.

## 2013-10-18 NOTE — Op Note (Signed)
[place]  [address] [place], [id number]   ENDOSCOPY PROCEDURE REPORT  PATIENT: Adrienne [name], Adrienne [name]  MR#: [id number] BIRTHDATE: 17-Dec-1950 , 62  yrs. old GENDER: Female ENDOSCOPIST:Tiquan Bouch Consuello Masse, MD, [facility] REFERRED BY: PROCEDURE DATE:  10/18/2013 PROCEDURE:   EGD w/ biopsy for H.pylori and Maloney dilation of esophagus ASA CLASS:    Class II INDICATIONS: Dysphagia and History of esophageal reflux. MEDICATION: Propofol (Diprivan) and Propofol (Diprivan) 130 mg IV TOPICAL ANESTHETIC:  DESCRIPTION OF PROCEDURE:   After the risks and benefits of the procedure were explained, informed consent was obtained.  The LB [id number]  endoscope was introduced through the mouth  and advanced to the second portion of the duodenum .  The instrument was slowly withdrawn as the mucosa was fully examined.      DUODENUM: The duodenal mucosa showed no abnormalities in the bulb and second portion of the duodenum.  STOMACH: There was mild antral gastropathy noted.  Cold forcep biopsies were taken at the antrum.  ESOPHAGUS: A stricture was found in the lower third of the esophagus.Dilated #4F Maloney dilator passed with some resistance but no heme or pain.    Retroflexed views revealed a small hiatal hernia.    The scope was then withdrawn from the patient and the procedure completed.  COMPLICATIONS: There were no complications.   ENDOSCOPIC IMPRESSION: 1.   The duodenal mucosa showed no abnormalities in the bulb and second portion of the duodenum 2.   There was mild antral gastropathy noted [T2] ...r/o H.Pylori infection 3.   Stricture was found in the lower third of the esophagus ...chronic GERD,treated.  RECOMMENDATIONS: 1.  Await biopsy results 2.  Continue PPI 3.  Continue current meds 4.  Dilatations PRN    _______________________________ eSigned:  Sable Feil, MD, [facility] 10/18/2013 3:54 PM      PATIENT NAME:  Adrienne [name], Adrienne [name] MR#:  [id number]

## 2013-10-19 ENCOUNTER — Telehealth: Payer: Self-pay | Admitting: *Deleted

## 2013-10-19 ENCOUNTER — Encounter: Payer: Self-pay | Admitting: Gastroenterology

## 2013-10-19 LAB — HELICOBACTER PYLORI SCREEN-BIOPSY: UREASE: NEGATIVE

## 2013-10-19 NOTE — Telephone Encounter (Signed)
  Follow up Call-  Call back number 10/18/2013  Post procedure Call Back phone  # [phone]  Permission to leave phone message Yes     Patient questions:  Do you have a fever, pain , or abdominal swelling? no Pain Score  0 *  Have you tolerated food without any problems? yes  Have you been able to return to your normal activities? yes  Do you have any questions about your discharge instructions: Diet   no Medications  no Follow up visit  no  Do you have questions or concerns about your Care? no  Actions: * If pain score is 4 or above: No action needed, pain <4.

## 2013-12-21 ENCOUNTER — Other Ambulatory Visit: Payer: Self-pay | Admitting: Orthopedic Surgery

## 2014-01-04 ENCOUNTER — Encounter (HOSPITAL_BASED_OUTPATIENT_CLINIC_OR_DEPARTMENT_OTHER): Payer: Self-pay | Admitting: *Deleted

## 2014-01-04 NOTE — Progress Notes (Signed)
Will need istat- 

## 2014-01-07 ENCOUNTER — Encounter (HOSPITAL_BASED_OUTPATIENT_CLINIC_OR_DEPARTMENT_OTHER): Payer: BC Managed Care – PPO | Admitting: Anesthesiology

## 2014-01-07 ENCOUNTER — Ambulatory Visit (HOSPITAL_BASED_OUTPATIENT_CLINIC_OR_DEPARTMENT_OTHER)
Admission: RE | Admit: 2014-01-07 | Discharge: 2014-01-07 | Disposition: A | Discharge status: Home / Self Care | Payer: BC Managed Care – PPO | Source: Ambulatory Visit | Attending: Orthopedic Surgery | Admitting: Orthopedic Surgery

## 2014-01-07 ENCOUNTER — Ambulatory Visit (HOSPITAL_BASED_OUTPATIENT_CLINIC_OR_DEPARTMENT_OTHER): Payer: BC Managed Care – PPO | Admitting: Anesthesiology

## 2014-01-07 ENCOUNTER — Encounter (HOSPITAL_BASED_OUTPATIENT_CLINIC_OR_DEPARTMENT_OTHER): Payer: Self-pay | Admitting: Anesthesiology

## 2014-01-07 ENCOUNTER — Encounter (HOSPITAL_BASED_OUTPATIENT_CLINIC_OR_DEPARTMENT_OTHER)
Admission: RE | Disposition: A | Discharge status: Home / Self Care | Payer: Self-pay | Source: Ambulatory Visit | Attending: Orthopedic Surgery

## 2014-01-07 DIAGNOSIS — F411 Generalized anxiety disorder: Secondary | ICD-10-CM | POA: Insufficient documentation

## 2014-01-07 DIAGNOSIS — K224 Dyskinesia of esophagus: Secondary | ICD-10-CM | POA: Insufficient documentation

## 2014-01-07 DIAGNOSIS — F329 Major depressive disorder, single episode, unspecified: Secondary | ICD-10-CM | POA: Insufficient documentation

## 2014-01-07 DIAGNOSIS — Z794 Long term (current) use of insulin: Secondary | ICD-10-CM | POA: Insufficient documentation

## 2014-01-07 DIAGNOSIS — E785 Hyperlipidemia, unspecified: Secondary | ICD-10-CM | POA: Insufficient documentation

## 2014-01-07 DIAGNOSIS — K2270 Barrett's esophagus without dysplasia: Secondary | ICD-10-CM | POA: Insufficient documentation

## 2014-01-07 DIAGNOSIS — F3289 Other specified depressive episodes: Secondary | ICD-10-CM | POA: Insufficient documentation

## 2014-01-07 DIAGNOSIS — K589 Irritable bowel syndrome without diarrhea: Secondary | ICD-10-CM | POA: Insufficient documentation

## 2014-01-07 DIAGNOSIS — M129 Arthropathy, unspecified: Secondary | ICD-10-CM | POA: Insufficient documentation

## 2014-01-07 DIAGNOSIS — K219 Gastro-esophageal reflux disease without esophagitis: Secondary | ICD-10-CM | POA: Insufficient documentation

## 2014-01-07 DIAGNOSIS — I10 Essential (primary) hypertension: Secondary | ICD-10-CM | POA: Insufficient documentation

## 2014-01-07 DIAGNOSIS — Z7982 Long term (current) use of aspirin: Secondary | ICD-10-CM | POA: Insufficient documentation

## 2014-01-07 DIAGNOSIS — E119 Type 2 diabetes mellitus without complications: Secondary | ICD-10-CM | POA: Insufficient documentation

## 2014-01-07 DIAGNOSIS — G5600 Carpal tunnel syndrome, unspecified upper limb: Secondary | ICD-10-CM | POA: Insufficient documentation

## 2014-01-07 HISTORY — PX: CARPAL TUNNEL RELEASE: SHX101

## 2014-01-07 LAB — POCT I-STAT, CHEM 8
BUN: 10 mg/dL (ref 6–23)
CHLORIDE: 104 meq/L (ref 96–112)
Calcium, Ion: 1.19 mmol/L (ref 1.13–1.30)
Creatinine, Ser: 0.8 mg/dL (ref 0.50–1.10)
Glucose, Bld: 110 mg/dL — ABNORMAL HIGH (ref 70–99)
HEMATOCRIT: 42 % (ref 36.0–46.0)
HEMOGLOBIN: 14.3 g/dL (ref 12.0–15.0)
Potassium: 3.8 mEq/L (ref 3.7–5.3)
SODIUM: 141 meq/L (ref 137–147)
TCO2: 26 mmol/L (ref 0–100)

## 2014-01-07 LAB — GLUCOSE, CAPILLARY: GLUCOSE-CAPILLARY: 89 mg/dL (ref 70–99)

## 2014-01-07 SURGERY — CARPAL TUNNEL RELEASE
Anesthesia: Monitor Anesthesia Care | Site: Hand | Laterality: Left

## 2014-01-07 MED ORDER — BUPIVACAINE HCL (PF) 0.25 % IJ SOLN
INTRAMUSCULAR | Status: AC
  Filled 2014-01-07: qty 30

## 2014-01-07 MED ORDER — PROPOFOL INFUSION 10 MG/ML OPTIME
INTRAVENOUS | Status: DC | PRN
  Administered 2014-01-07: 75 ug/kg/min via INTRAVENOUS

## 2014-01-07 MED ORDER — CEFAZOLIN SODIUM-DEXTROSE 2-3 GM-% IV SOLR
INTRAVENOUS | Status: AC
  Filled 2014-01-07: qty 50

## 2014-01-07 MED ORDER — MIDAZOLAM HCL 5 MG/5ML IJ SOLN
INTRAMUSCULAR | Status: DC | PRN
  Administered 2014-01-07: 1 mg via INTRAVENOUS

## 2014-01-07 MED ORDER — FENTANYL CITRATE 0.05 MG/ML IJ SOLN
INTRAMUSCULAR | Status: DC | PRN
Start: 2014-01-07 — End: 2014-01-07
  Administered 2014-01-07: 50 ug via INTRAVENOUS

## 2014-01-07 MED ORDER — HYDROMORPHONE HCL PF 1 MG/ML IJ SOLN: 0.2500 mg | INTRAMUSCULAR | Status: DC | PRN

## 2014-01-07 MED ORDER — PROMETHAZINE HCL 25 MG/ML IJ SOLN: 6.2500 mg | INTRAMUSCULAR | Status: DC | PRN

## 2014-01-07 MED ORDER — LIDOCAINE HCL (PF) 0.5 % IJ SOLN
INTRAMUSCULAR | Status: DC | PRN
  Administered 2014-01-07: 30 mL via INTRAVENOUS

## 2014-01-07 MED ORDER — FENTANYL CITRATE 0.05 MG/ML IJ SOLN: 50.0000 ug | INTRAMUSCULAR | Status: DC | PRN

## 2014-01-07 MED ORDER — CEFAZOLIN SODIUM-DEXTROSE 2-3 GM-% IV SOLR
2.0000 g | INTRAVENOUS | Status: AC
  Administered 2014-01-07: 2 g via INTRAVENOUS

## 2014-01-07 MED ORDER — HYDROCODONE-ACETAMINOPHEN 5-325 MG PO TABS: ORAL_TABLET | ORAL | Status: DC

## 2014-01-07 MED ORDER — BUPIVACAINE HCL (PF) 0.25 % IJ SOLN
INTRAMUSCULAR | Status: DC | PRN
  Administered 2014-01-07: 8 mL

## 2014-01-07 MED ORDER — LIDOCAINE HCL (CARDIAC) 20 MG/ML IV SOLN
INTRAVENOUS | Status: DC | PRN
  Administered 2014-01-07: 30 mg via INTRAVENOUS

## 2014-01-07 MED ORDER — MEPERIDINE HCL 25 MG/ML IJ SOLN: 6.2500 mg | INTRAMUSCULAR | Status: DC | PRN

## 2014-01-07 MED ORDER — MIDAZOLAM HCL 2 MG/2ML IJ SOLN
INTRAMUSCULAR | Status: AC
  Filled 2014-01-07: qty 2

## 2014-01-07 MED ORDER — LACTATED RINGERS IV SOLN
INTRAVENOUS | Status: DC
  Administered 2014-01-07: 12:00:00 via INTRAVENOUS

## 2014-01-07 MED ORDER — MIDAZOLAM HCL 2 MG/2ML IJ SOLN: 1.0000 mg | INTRAMUSCULAR | Status: DC | PRN

## 2014-01-07 MED ORDER — ONDANSETRON HCL 4 MG/2ML IJ SOLN
INTRAMUSCULAR | Status: DC | PRN
  Administered 2014-01-07: 4 mg via INTRAVENOUS

## 2014-01-07 MED ORDER — OXYCODONE HCL 5 MG PO TABS: 5.0000 mg | ORAL_TABLET | Freq: Once | ORAL | Status: DC | PRN

## 2014-01-07 MED ORDER — CHLORHEXIDINE GLUCONATE 4 % EX LIQD: 60.0000 mL | Freq: Once | CUTANEOUS | Status: DC

## 2014-01-07 MED ORDER — OXYCODONE HCL 5 MG/5ML PO SOLN: 5.0000 mg | Freq: Once | ORAL | Status: DC | PRN

## 2014-01-07 MED ORDER — FENTANYL CITRATE 0.05 MG/ML IJ SOLN
INTRAMUSCULAR | Status: AC
  Filled 2014-01-07: qty 4

## 2014-01-07 SURGICAL SUPPLY — 37 items
BANDAGE ELASTIC 3 VELCRO ST LF (GAUZE/BANDAGES/DRESSINGS) ×3 IMPLANT
BLADE MINI RND TIP GREEN BEAV (BLADE) IMPLANT
BLADE SURG 15 STRL LF DISP TIS (BLADE) ×2 IMPLANT
BLADE SURG 15 STRL SS (BLADE) ×4
BNDG ESMARK 4X9 LF (GAUZE/BANDAGES/DRESSINGS) IMPLANT
BNDG GAUZE ELAST 4 BULKY (GAUZE/BANDAGES/DRESSINGS) ×3 IMPLANT
CHLORAPREP W/TINT 26ML (MISCELLANEOUS) ×3 IMPLANT
CORDS BIPOLAR (ELECTRODE) ×3 IMPLANT
COVER MAYO STAND STRL (DRAPES) ×3 IMPLANT
COVER TABLE BACK 60X90 (DRAPES) ×3 IMPLANT
CUFF TOURNIQUET SINGLE 18IN (TOURNIQUET CUFF) ×3 IMPLANT
DRAPE EXTREMITY T 121X128X90 (DRAPE) ×3 IMPLANT
DRAPE SURG 17X23 STRL (DRAPES) ×3 IMPLANT
DRSG PAD ABDOMINAL 8X10 ST (GAUZE/BANDAGES/DRESSINGS) ×3 IMPLANT
GAUZE XEROFORM 1X8 LF (GAUZE/BANDAGES/DRESSINGS) ×3 IMPLANT
GLOVE BIO SURGEON STRL SZ7.5 (GLOVE) ×3 IMPLANT
GLOVE BIOGEL PI IND STRL 7.5 (GLOVE) ×1 IMPLANT
GLOVE BIOGEL PI IND STRL 8 (GLOVE) ×1 IMPLANT
GLOVE BIOGEL PI INDICATOR 7.5 (GLOVE) ×2
GLOVE BIOGEL PI INDICATOR 8 (GLOVE) ×2
GLOVE SURG SS PI 7.5 STRL IVOR (GLOVE) ×3 IMPLANT
GOWN STRL REUS W/ TWL LRG LVL3 (GOWN DISPOSABLE) IMPLANT
GOWN STRL REUS W/ TWL XL LVL3 (GOWN DISPOSABLE) ×1 IMPLANT
GOWN STRL REUS W/TWL LRG LVL3 (GOWN DISPOSABLE)
GOWN STRL REUS W/TWL XL LVL3 (GOWN DISPOSABLE) ×6 IMPLANT
NEEDLE HYPO 25X1 1.5 SAFETY (NEEDLE) ×3 IMPLANT
NS IRRIG 1000ML POUR BTL (IV SOLUTION) ×3 IMPLANT
PACK BASIN DAY SURGERY FS (CUSTOM PROCEDURE TRAY) ×3 IMPLANT
PADDING CAST ABS 4INX4YD NS (CAST SUPPLIES) ×2
PADDING CAST ABS COTTON 4X4 ST (CAST SUPPLIES) ×1 IMPLANT
SPONGE GAUZE 4X4 12PLY (GAUZE/BANDAGES/DRESSINGS) ×3 IMPLANT
STOCKINETTE 4X48 STRL (DRAPES) ×3 IMPLANT
SUT ETHILON 4 0 PS 2 18 (SUTURE) ×3 IMPLANT
SYR BULB 3OZ (MISCELLANEOUS) ×3 IMPLANT
SYR CONTROL 10ML LL (SYRINGE) ×3 IMPLANT
TOWEL OR 17X24 6PK STRL BLUE (TOWEL DISPOSABLE) ×3 IMPLANT
UNDERPAD 30X30 INCONTINENT (UNDERPADS AND DIAPERS) ×3 IMPLANT

## 2014-01-07 NOTE — Op Note (Signed)
NAMEKAZARIA, GAERTNER NO.:  [id number]  MEDICAL RECORD NO.:  [id number]  LOCATION:                                 FACILITY:  PHYSICIAN:  Leanora Cover, MD             DATE OF BIRTH:  DATE OF PROCEDURE:  01/07/2014 DATE OF DISCHARGE:                              OPERATIVE REPORT   PREOPERATIVE DIAGNOSIS:  Left carpal tunnel syndrome.  POSTOPERATIVE DIAGNOSIS:  Left carpal tunnel syndrome.  PROCEDURE:  Left carpal tunnel release.  SURGEON:  Leanora Cover, MD  ASSISTANT:  None.  ANESTHESIA:  General, Bier block with sedation.  IV FLUIDS:  Per anesthesia flow sheet.  ESTIMATED BLOOD LOSS:  Minimal.  COMPLICATIONS:  None.  SPECIMENS:  None.  TOURNIQUET TIME:  29 minutes.  DISPOSITION:  Stable to PACU.  INDICATIONS:  Ms. Bicking is a 63 year old female with pins and needle sensation in her fingers.  She has positive nerve conduction studies. She has nocturnal symptoms and wishes to have a carpal tunnel release for management of the symptoms.  Risks, benefits and alternatives of the surgery were discussed including the risk of blood loss; infection; damage to nerves, vessels, tendons, ligaments, bone; failure of surgery; need for additional surgery; complications with wound healing; continued pain; and recurrence of carpal tunnel syndrome.  She voiced understanding of these risks and elected to proceed.  OPERATIVE COURSE:  After being identified preoperatively by myself, the patient and I agreed upon the procedure and site of procedure.  Surgical site was marked.  The risks, benefits, and alternatives of the surgery were reviewed and she wished to proceed.  Surgical consent had been signed.  She was given IV Ancef as preoperative antibiotic prophylaxis. She was transferred to the operating room and placed on the operating room table in supine position with left upper extremity on an armboard. Bier block anesthesia was induced by the anesthesiologist.   Left upper extremity was prepped and draped in normal sterile orthopedic fashion. Surgical pause was performed between the surgeons, anesthesia, and operating room staff, and all were in agreement as to the patient, procedure, site of procedure.  Tourniquet at the proximal aspect of the extremity had been inflated for the Bier block.  Incision was made over the transverse carpal ligament, carried in subcutaneous tissues by spreading technique.  Bipolar electrocautery was used to obtain hemostasis.  The palmar fascia was incised sharply.  The transverse carpal ligament was identified.  It was cleared of fatty tissue covering it.  The transverse carpal ligament was incised sharply in a distal direction.  Care was taken to ensure complete decompression distally. It was incised proximally.  The scissors were used to split the distal aspect of the volar antebrachial fascia.  A finger was placed into the wound to ensure complete decompression, which was the case.  The median nerve was examined.  It was flattened and hyperemic.  The motor branch was identified and was intact.  The wound was copiously irrigated with sterile saline.  It was closed with 4-0 nylon in a horizontal mattress fashion.  It was injected with 8 mL of 0.25% plain Marcaine to aid in postoperative  analgesia.  It was then dressed with sterile Xeroform, 4x4s, and ABD and wrapped with a Kerlix and Ace bandage.  Tourniquet was deflated to 29 minutes.  Fingertips were pink with brisk capillary refill after deflation of the tourniquet.  Operative drapes were broken down.  The patient was awoken from anesthesia safely.  She was transferred back to stretcher and taken to PACU in stable condition.  I will see her back in the office 1 week for postoperative followup.  I will give her Norco 5/325, 1-2 p.o. q.6 hours p.r.n. pain, dispensed #30.     Leanora Cover, MD     KK/MEDQ  D:  01/07/2014  T:  01/07/2014  Job:  [id number]

## 2014-01-07 NOTE — Transfer of Care (Signed)
Immediate Anesthesia Transfer of Care Note  Patient: Adrienne [name]  Procedure(s) Performed: Procedure(s): LEFT CARPAL TUNNEL RELEASE (Left)  Patient Location: PACU  Anesthesia Type:Bier block  Level of Consciousness: awake, alert , oriented and patient cooperative  Airway & Oxygen Therapy: Patient Spontanous Breathing and Patient connected to face mask oxygen  Post-op Assessment: Report given to PACU RN and Post -op Vital signs reviewed and stable  Post vital signs: Reviewed and stable  Complications: No apparent anesthesia complications

## 2014-01-07 NOTE — Brief Op Note (Signed)
01/07/2014  2:28 PM  PATIENT:  Kingsley Plan  63 y.o. female  PRE-OPERATIVE DIAGNOSIS:  LEFT CARPAL TUNNEL SYNDROME  POST-OPERATIVE DIAGNOSIS:  LEFT CARPAL TUNNEL SYNDROME  PROCEDURE:  Procedure(s): LEFT CARPAL TUNNEL RELEASE (Left)  SURGEON:  Surgeon(s) and Role:    * Tennis Must, MD - Primary  PHYSICIAN ASSISTANT:   ASSISTANTS: none   ANESTHESIA:   Bier block with sedation  EBL:  Total I/O In: 800 [I.V.:800] Out: -   BLOOD ADMINISTERED:none  DRAINS: none   LOCAL MEDICATIONS USED:  MARCAINE     SPECIMEN:  No Specimen  DISPOSITION OF SPECIMEN:  N/A  COUNTS:  YES  TOURNIQUET:   Total Tourniquet Time Documented: Forearm (Left) - 29 minutes Total: Forearm (Left) - 29 minutes   DICTATION: .Other Dictation: Dictation Number [id number]  PLAN OF CARE: Discharge to home after PACU  PATIENT DISPOSITION:  PACU - hemodynamically stable.

## 2014-01-07 NOTE — Anesthesia Preprocedure Evaluation (Addendum)
Anesthesia Evaluation  Patient identified by MRN, date of birth, ID band Patient awake    Reviewed: Allergy & Precautions, H&P , NPO status , Patient's Chart, lab work & pertinent test results  History of Anesthesia Complications Negative for: history of anesthetic complications  Airway Mallampati: II TM Distance: >3 FB Neck ROM: Full    Dental  (+) Teeth Intact, Dental Advisory Given, Chipped   Pulmonary neg pulmonary ROS,  breath sounds clear to auscultation  Pulmonary exam normal       Cardiovascular hypertension, Pt. on medications Rhythm:Regular Rate:Normal     Neuro/Psych Depression negative neurological ROS     GI/Hepatic Neg liver ROS, GERD-  Medicated and Controlled,  Endo/Other  diabetes (glu 110), Insulin DependentMorbid obesity  Renal/GU negative Renal ROS     Musculoskeletal   Abdominal   Peds  Hematology   Anesthesia Other Findings   Reproductive/Obstetrics                          Anesthesia Physical Anesthesia Plan  ASA: III  Anesthesia Plan: MAC and Bier Block   Post-op Pain Management:    Induction:   Airway Management Planned: Natural Airway  Additional Equipment:   Intra-op Plan:   Post-operative Plan:   Informed Consent: I have reviewed the patients History and Physical, chart, labs and discussed the procedure including the risks, benefits and alternatives for the proposed anesthesia with the patient or authorized representative who has indicated his/her understanding and acceptance.   Dental advisory given  Plan Discussed with: CRNA and Surgeon  Anesthesia Plan Comments: (Plan routine monitors, IV regional Lidocaine with MAC)        Anesthesia Quick Evaluation

## 2014-01-07 NOTE — H&P (Addendum)
Adrienne [name] is an 63 y.o. female.   Chief Complaint: left carpal tunnel syndrome HPI: 63 yo rhd female with numbness and tingling fingertips of all fingers.  This has become increasingly bothersome to her.  Nocturnal symptoms that wake her and cause her to shake her hands to get relief.  Positive nerve conduction studies.  She wishes to have carpal tunnel release for management of her symptoms.  Past Medical History  Diagnosis Date  . Esophageal spasm   . Nutcracker esophagus   . Barrett esophagus   . Unspecified gastritis and gastroduodenitis without mention of hemorrhage   . Esophageal reflux   . Colitis   . Family history of malignant neoplasm of gastrointestinal tract   . Irritable bowel syndrome   . Generalized anxiety disorder   . Type II or unspecified type diabetes mellitus without mention of complication, not stated as uncontrolled   . Hyperlipidemia   . Allergy   . Arthritis   . Depression   . Hypertension   . Contact lens/glasses fitting     wears contacts or glasses    Past Surgical History  Procedure Laterality Date  . Tonsillectomy    . Nasal sinus surgery    . Eye surgery      right-tear duct surgery  . Colonoscopy    . Upper gastrointestinal endoscopy      Family History  Problem Relation Age of Onset  . Colon cancer Maternal Grandmother   . Stomach cancer Maternal Grandfather   . Prostate cancer Paternal Grandfather   . Diabetes Sister   . Diabetes Father   . Heart disease Father   . Heart disease Mother   . Inflammatory bowel disease Grandchild   . Rectal cancer Neg Hx   . Esophageal cancer Neg Hx    Social History:  reports that she has never smoked. She has never used smokeless tobacco. She reports that she drinks alcohol. She reports that she does not use illicit drugs.  Allergies:  Allergies  Allergen Reactions  . Sulfonamide Derivatives     As a child- break out    Medications Prior to Admission  Medication Sig Dispense Refill   . aspirin 81 MG tablet Take 81 mg by mouth daily.      . Calcium Carbonate-Vit D-Min 1200-1000 MG-UNIT CHEW Chew 1 tablet by mouth 2 (two) times daily.      . cholecalciferol (VITAMIN D) 1000 UNITS tablet Take 1,000 Units by mouth 2 (two) times daily.      Marland Kitchen esomeprazole (NEXIUM) 40 MG capsule Take 40 mg by mouth 2 (two) times daily.      . Exenatide ER (BYDUREON) 2 MG SUSR Inject 2 mg into the skin once a week.      . Fe Fum-FePoly-FA-Vit C-Vit B3 (INTEGRA F) 125-1 MG CAPS Take 1 capsule by mouth daily.      . hydrocortisone (ANUSOL-HC) 25 MG suppository Place 1 suppository (25 mg total) rectally every 12 (twelve) hours.  14 suppository  1  . hyoscyamine (LEVSIN SL) 0.125 MG SL tablet Place 1 tablet (0.125 mg total) under the tongue every 4 (four) hours as needed for cramping.  30 tablet  1  . imipramine (TOFRANIL) 25 MG tablet Take 25 mg by mouth at bedtime.      . insulin detemir (LEVEMIR) 100 UNIT/ML injection Inject 60 Units into the skin daily.       . isosorbide mononitrate (IMDUR) 30 MG 24 hr tablet Take 15 mg by mouth  daily.      . loperamide (IMODIUM A-D) 2 MG tablet Take 2 mg by mouth 4 (four) times daily as needed for diarrhea or loose stools.      Marland Kitchen olanzapine-FLUoxetine (SYMBYAX) 6-25 MG per capsule Take 1 capsule by mouth every evening.      . ramipril (ALTACE) 10 MG tablet Take 10 mg by mouth daily.      . Saxagliptin-Metformin (KOMBIGLYZE XR) 2.01-999 MG TB24 Take 1 tablet by mouth 2 (two) times daily.      . simvastatin (ZOCOR) 40 MG tablet Take 40 mg by mouth every evening.        Results for orders placed during the hospital encounter of 01/07/14 (from the past 48 hour(s))  POCT I-STAT, CHEM 8     Status: Abnormal   Collection Time    01/07/14 12:35 PM      Result Value Ref Range   Sodium 141  137 - 147 mEq/L   Potassium 3.8  3.7 - 5.3 mEq/L   Chloride 104  96 - 112 mEq/L   BUN 10  6 - 23 mg/dL   Creatinine, Ser 0.80  0.50 - 1.10 mg/dL   Glucose, Bld 110 (*) 70 - 99  mg/dL   Calcium, Ion 1.19  1.13 - 1.30 mmol/L   TCO2 26  0 - 100 mmol/L   Hemoglobin 14.3  12.0 - 15.0 g/dL   HCT 42.0  36.0 - 46.0 %    No results found.   A comprehensive review of systems was negative except for: Eyes: positive for cataracts and contacts/glasses Ears, nose, mouth, throat, and face: positive for hearing loss and tinnitus Respiratory: positive for hoarseness Behavioral/Psych: positive for anxiety and depression  Blood pressure 120/65, pulse 90, temperature 98.1 F (36.7 C), temperature source Oral, resp. rate 16, height 5' (1.524 m), weight 77.111 kg (170 lb), SpO2 96.00%.  General appearance: alert, cooperative and appears stated age Head: Normocephalic, without obvious abnormality, atraumatic Neck: supple, symmetrical, trachea midline Resp: clear to auscultation bilaterally Cardio: regular rate and rhythm GI: non tender Extremities: intact sensation and capillary refill all digits.  +epl/fpl/io.  She can abduct the thumb from the palm and oppose to the small finger. Pulses: 2+ and symmetric Skin: Skin color, texture, turgor normal. No rashes or lesions Neurologic: Grossly normal Incision/Wound: none  Assessment/Plan Bilateral carpal tunnel syndrome.  Non operative and operative treatment options were discussed with the patient and patient wishes to proceed with operative treatment. Risks, benefits, and alternatives of surgery were discussed and the patient agrees with the plan of care.   Tennis Must 01/07/2014, 1:39 PM

## 2014-01-07 NOTE — Anesthesia Postprocedure Evaluation (Signed)
  Anesthesia Post-op Note  Patient: Adrienne [name]  Procedure(s) Performed: Procedure(s): LEFT CARPAL TUNNEL RELEASE (Left)  Patient Location: PACU  Anesthesia Type:MAC and Bier block  Level of Consciousness: awake, alert , oriented and patient cooperative  Airway and Oxygen Therapy: Patient Spontanous Breathing  Post-op Pain: none  Post-op Assessment: Post-op Vital signs reviewed, Patient's Cardiovascular Status Stable, Respiratory Function Stable, Patent Airway, No signs of Nausea or vomiting and Pain level controlled  Post-op Vital Signs: Reviewed and stable  Last Vitals:  Filed Vitals:   01/07/14 1527  BP: 128/55  Pulse: 84  Temp: 36.6 C  Resp: 16    Complications: No apparent anesthesia complications

## 2014-01-07 NOTE — Op Note (Signed)
[id number] 

## 2014-01-07 NOTE — Discharge Instructions (Addendum)

## 2014-01-11 ENCOUNTER — Encounter (HOSPITAL_BASED_OUTPATIENT_CLINIC_OR_DEPARTMENT_OTHER): Payer: Self-pay | Admitting: Orthopedic Surgery

## 2014-05-18 ENCOUNTER — Other Ambulatory Visit: Payer: Self-pay

## 2014-07-26 ENCOUNTER — Other Ambulatory Visit: Payer: Self-pay

## 2014-07-26 DIAGNOSIS — Z1231 Encounter for screening mammogram for malignant neoplasm of breast: Secondary | ICD-10-CM

## 2014-08-29 ENCOUNTER — Ambulatory Visit
Admission: RE | Admit: 2014-08-29 | Discharge: 2014-08-29 | Disposition: A | Discharge status: Home / Self Care | Payer: BC Managed Care – PPO | Source: Ambulatory Visit

## 2014-08-29 DIAGNOSIS — Z1231 Encounter for screening mammogram for malignant neoplasm of breast: Secondary | ICD-10-CM

## 2015-04-06 ENCOUNTER — Encounter: Payer: Self-pay | Admitting: Gastroenterology

## 2015-05-30 ENCOUNTER — Encounter: Payer: Self-pay | Admitting: Internal Medicine

## 2015-05-30 ENCOUNTER — Ambulatory Visit (INDEPENDENT_AMBULATORY_CARE_PROVIDER_SITE_OTHER): Payer: BLUE CROSS/BLUE SHIELD | Admitting: Internal Medicine

## 2015-05-30 VITALS — BP 114/70 | HR 80 | Ht 60.24 in | Wt 161.1 lb

## 2015-05-30 DIAGNOSIS — R1319 Other dysphagia: Secondary | ICD-10-CM

## 2015-05-30 DIAGNOSIS — R1314 Dysphagia, pharyngoesophageal phase: Secondary | ICD-10-CM

## 2015-05-30 DIAGNOSIS — K224 Dyskinesia of esophagus: Secondary | ICD-10-CM

## 2015-05-30 DIAGNOSIS — K580 Irritable bowel syndrome with diarrhea: Secondary | ICD-10-CM

## 2015-05-30 DIAGNOSIS — R1310 Dysphagia, unspecified: Secondary | ICD-10-CM

## 2015-05-30 MED ORDER — ELUXADOLINE 100 MG PO TABS: 100.0000 mg | ORAL_TABLET | Freq: Two times a day (BID) | ORAL | Status: DC

## 2015-05-30 NOTE — Progress Notes (Signed)
Subjective:    Patient ID: Kingsley Plan, female    DOB: 04-11-1951, 64 y.o.   MRN: [id number]  HPI Verlena Marlette is a 64 year old female with long history of diarrhea predominant IBS, esophageal dysmotility and spasm, chronic gas and bloating, GERD, possible occult stricture at the GE junction dilated in 2015 by Dr. Sharlett Iles who is seen for follow-up. She is here alone today and was last in the office in January 2015. She reports that she's been having considerable flares of her irritable bowel over the last 4-5 months. The symptoms are intermittently chronic for her dating back 30 years. This is primarily urgent loose stools after eating. Can lead to having "accidents". Unaware of the trigger. Uses Imodium right ear frequently as many as 6 tablets at a time. Worse with nervousness or anxiety. Associated with lower abdominal cramping which improves after bowel movement. Previously Levsin was helpful. Has seen commercials for Viberzi and feels the same way the person portrays IBS-D in the commercial. She takes Nexium 40 mg once daily which controls heartburn. She denies heartburn. No odynophagia. Occasionally issues with swallowing solid food though not as severe as before her dilation in January 2015. Occasionally food gets hung up. She doesn't feel it warrants dilation at this point. She has lost 10 pounds intentionally by going to [employer] over the last several months. No alcohol use or cigarettes. Denies NSAIDs. No pertinent family history.   Review of Systems As per history of present illness, otherwise negative  Current Medications, Allergies, Past Medical History, Past Surgical History, Family History and Social History were reviewed in [employer] record.     Objective:   Physical Exam BP 114/70 mmHg  Pulse 80  Ht 5' 0.24" (1.53 m)  Wt 161 lb 2 oz (73.086 kg)  BMI 31.22 kg/m2 Constitutional: Well-developed and well-nourished. No distress. HEENT:  Normocephalic and atraumatic. Oropharynx is clear and moist. No oropharyngeal exudate. Conjunctivae are normal.  No scleral icterus. Neck: Neck supple. Trachea midline. Cardiovascular: Normal rate, regular rhythm and intact distal pulses.  Pulmonary/chest: Effort normal and breath sounds normal. No wheezing, rales or rhonchi. Abdominal: Soft, nontender, nondistended. Bowel sounds active throughout.  Extremities: no clubbing, cyanosis, or edema Neurological: Alert and oriented to person place and time. Skin: Skin is warm and dry. No rashes noted. Psychiatric: Normal mood and affect. Behavior is normal.  Routine labs - [facility], Dr. Osborne Casco  EGD 10/18/2013 -- stricture in the lower 30 esophagus dilated with 31 Elsie. Small hiatal hernia. Mild antral gastropathy, biopsies negative for H. pylori. Normal examined duodenum Colonoscopy 09/14/2010 -- normal. Osseous negative for microscopic colitis. Terminal ileum biopsied and normal.     Assessment & Plan:  64 year old female with long history of diarrhea predominant IBS, esophageal dysmotility and spasm, chronic gas and bloating, GERD, possible occult stricture at the GE junction dilated in 2015 by Dr. Sharlett Iles who is seen for follow-up  1. IBS-D -- chronic and long-standing. Previously microscopic colitis is been ruled out. Symptoms very consistent with irritable bowel. Again Viberzi 100 mg twice daily with food. Discontinue Imodium. Follow-up in 3 months to assess response, call if having constipation, abdominal pain or other troublesome symptoms  2. Esophageal dysmotility/dysphagia -- improved but intermittent now. No evidence of luminal lesion to warrant repeat endoscopy until symptoms warrant. I expect there is dysmotility and possibly even spasm contributing to intermittent dysphagia symptoms. That said, she did respond to dilation previously and if symptoms want, repeat dilation is  reasonable.  3. Colon cancer screening --  repeat colonoscopy recommended in December 2021  Follow-up in 3 months, sooner if necessary 25 minutes spent with patient today discussing the above symptoms and new medication

## 2015-05-30 NOTE — Patient Instructions (Signed)
We have sent the following medications to your pharmacy for you to pick up at your convenience: Vibrezi 100 mg twice a day with food.   Stop Imodium.  Follow-up with Dr. Hilarie Fredrickson in 3 months on 08/29/15 at 9 am.

## 2015-08-02 ENCOUNTER — Encounter: Payer: Self-pay | Admitting: *Deleted

## 2015-08-22 ENCOUNTER — Other Ambulatory Visit: Payer: Self-pay

## 2015-08-22 DIAGNOSIS — Z1231 Encounter for screening mammogram for malignant neoplasm of breast: Secondary | ICD-10-CM

## 2015-08-29 ENCOUNTER — Encounter: Payer: Self-pay | Admitting: Internal Medicine

## 2015-08-29 ENCOUNTER — Ambulatory Visit (INDEPENDENT_AMBULATORY_CARE_PROVIDER_SITE_OTHER): Payer: BLUE CROSS/BLUE SHIELD | Admitting: Internal Medicine

## 2015-08-29 VITALS — BP 128/70 | HR 100 | Ht 60.25 in | Wt 161.2 lb

## 2015-08-29 DIAGNOSIS — K580 Irritable bowel syndrome with diarrhea: Secondary | ICD-10-CM | POA: Diagnosis not present

## 2015-08-29 DIAGNOSIS — R143 Flatulence: Secondary | ICD-10-CM | POA: Diagnosis not present

## 2015-08-29 DIAGNOSIS — R140 Abdominal distension (gaseous): Secondary | ICD-10-CM

## 2015-08-29 DIAGNOSIS — R1310 Dysphagia, unspecified: Secondary | ICD-10-CM | POA: Diagnosis not present

## 2015-08-29 DIAGNOSIS — IMO0001 Reserved for inherently not codable concepts without codable children: Secondary | ICD-10-CM

## 2015-08-29 MED ORDER — METRONIDAZOLE 250 MG PO TABS: 250.0000 mg | ORAL_TABLET | Freq: Three times a day (TID) | ORAL | Status: DC

## 2015-08-29 NOTE — Progress Notes (Signed)
Subjective:    Patient ID: Adrienne [name], female    DOB: 1950/12/13, 64 y.o.   MRN: [id number]  HPI Adrienne [name] is a 64 year old female with long-standing history of irritable bowel with diarrhea predominance, esophageal dysmotility and spasm, chronic gas and bloating, GERD, and possible occult distal esophageal stricture status post dilation in 2015 who is here for follow-up. She was last seen in August 2016 and started on Viberzi for IBS D. She reports that her symptoms are "much better". She's had resolution of diarrhea and no accidents. She has had some mild constipation and can go as long as 4-5 days without a bowel movement. She has noticed increased lower GI gas and flatulence. She had 2 bouts of diarrhea since her last visit and she was able to associate both episodes with eating great. She denies abdominal pain. She does have solid food dysphagia particularly with dry foods such as a [place] sandwich. She continues Nexium 40 mg twice a day. She has no heartburn or odynophagia. No nausea or vomiting. Her dysphagia had previously responded to dilation with [place]. Last EGD was January 2015, no evidence of Barrett's esophagus. Dilation performed to [place] As per history of present illness, otherwise negative  Current Medications, Allergies, Past Medical History, Past Surgical History, Family History and Social History were reviewed in [employer] record.     Objective:   Physical Exam BP 128/70 mmHg  Pulse 100  Ht 5' 0.25" (1.53 m)  Wt 161 lb 4 oz (73.143 kg)  BMI 31.25 kg/m2 Constitutional: Well-developed and well-nourished. No distress. HEENT: Normocephalic and atraumatic.  Conjunctivae are normal.  No scleral icterus. Neck: Neck supple. Trachea midline. Cardiovascular: Normal rate, regular rhythm and intact distal pulses. No M/R/G Pulmonary/chest: Effort normal and breath sounds normal. No wheezing, rales or  rhonchi. Abdominal: Soft, nontender, nondistended. Bowel sounds active throughout.  Extremities: no clubbing, cyanosis, or edema Neurological: Alert and oriented to person place and time. Psychiatric: Normal mood and affect. Behavior is normal.  EGD 10/18/2013 -- stricture in the lower 30 esophagus dilated with [place]. Small hiatal hernia. Mild antral gastropathy, biopsies negative for H. pylori. Normal examined duodenum Colonoscopy 09/14/2010 -- normal. Osseous negative for microscopic colitis. Terminal ileum biopsied and normal.    Assessment & [name]:  64 year old female with long-standing history of irritable bowel with diarrhea predominance, esophageal dysmotility and spasm, chronic gas and bloating, GERD, and possible occult distal esophageal stricture status post dilation in 2015 who is here for follow-up.  1. IBS-D -- she has been pleased with the dramatic reduction diarrhea in response to Viberzi. She's had increased gas and flatulence and can go as much is 5 days without bowel movement. I would like to reduce Viberzi to 100 mg once daily to see if she has slightly more frequent bowel movements, less gas and flatulence and no recurrence of diarrhea. She is instructed that she can take the second dose if felt needed. Another option would be to reduce to 75 mg twice a day. Low gas and low bloating diet recommended. If she makes changes to Viberzi and continues to have gas and bloating of recommended treatment with metronidazole 250 mg 3 times a day 7 days for possible concomitant bacterial overgrowth.  2. Esophageal dysphagia -- likely in part dysmotility cannot exclude occult stricture. Seem to respond to dilation. EGD with dilation offered but she prefers observation for now. I expect she will need  dilation again in the future. Continue Nexium 40 mg twice a day for GERD controlled  Follow-up in 3 months, sooner if necessary 25 minutes spent with the patient today. Greater than 50%  was spent in counseling and coordination of care with the patient

## 2015-08-29 NOTE — Patient Instructions (Signed)
Please decrease Viberzi to 1 capsule daily dosing. You may take a second dose if diarrhea returns.  Please follow a low gas diet. We have given you a brochure on this.  We have sent the following medications to your pharmacy for you to pick up at your convenience: Flagyl 250 mg three times daily x 7 days (if gas/bloating do not improve with low gas diet)  Please follow up with Dr Hilarie Fredrickson on Wednesday, 11/22/15 @ 2:30 pm.

## 2015-09-06 ENCOUNTER — Encounter: Payer: Self-pay | Admitting: Internal Medicine

## 2015-09-26 ENCOUNTER — Ambulatory Visit
Admission: RE | Admit: 2015-09-26 | Discharge: 2015-09-26 | Disposition: A | Discharge status: Home / Self Care | Payer: BLUE CROSS/BLUE SHIELD | Source: Ambulatory Visit

## 2015-09-26 DIAGNOSIS — Z1231 Encounter for screening mammogram for malignant neoplasm of breast: Secondary | ICD-10-CM

## 2015-11-20 ENCOUNTER — Telehealth: Payer: Self-pay | Admitting: Cardiology

## 2015-11-20 NOTE — Telephone Encounter (Signed)
Received records from [facility] for appointment on 12/08/15 with Dr Percival Spanish.  Records given to [facility] (medical records) for Dr Hochrein's schedule on 12/08/15. lp

## 2015-11-22 ENCOUNTER — Ambulatory Visit: Payer: BLUE CROSS/BLUE SHIELD | Admitting: Internal Medicine

## 2015-12-05 ENCOUNTER — Ambulatory Visit: Payer: BLUE CROSS/BLUE SHIELD | Admitting: Internal Medicine

## 2015-12-05 ENCOUNTER — Ambulatory Visit (INDEPENDENT_AMBULATORY_CARE_PROVIDER_SITE_OTHER): Payer: BLUE CROSS/BLUE SHIELD | Admitting: Internal Medicine

## 2015-12-05 ENCOUNTER — Encounter: Payer: Self-pay | Admitting: Internal Medicine

## 2015-12-05 VITALS — BP 120/80 | HR 101 | Ht 60.25 in | Wt 162.0 lb

## 2015-12-05 DIAGNOSIS — K219 Gastro-esophageal reflux disease without esophagitis: Secondary | ICD-10-CM | POA: Diagnosis not present

## 2015-12-05 DIAGNOSIS — K580 Irritable bowel syndrome with diarrhea: Secondary | ICD-10-CM

## 2015-12-05 DIAGNOSIS — R140 Abdominal distension (gaseous): Secondary | ICD-10-CM | POA: Diagnosis not present

## 2015-12-05 NOTE — Patient Instructions (Signed)
We will send a prescription of Viberzi 75 mg to take daily.  Continue Nexium.  You will be due for a recall colonoscopy in 08/2020. We will send you a reminder in the mail when it gets closer to that time.  Please follow up with Dr Hilarie Fredrickson in 6 months.

## 2015-12-05 NOTE — Progress Notes (Signed)
Subjective:    Patient ID: Adrienne [name], female    DOB: Jan 26, 1951, 65 y.o.   MRN: [id number]  HPI Adrienne [name] is a 65 year old female with long-standing IBS with diarrhea, esophageal dysmotility with spasm, chronic gas and bloating, GERD who is here for follow-up. Last seen November 2016. She has been started on Viberzi which has been life changing for her. When taking 100 mg twice daily she was having bowel movement every 4-5 days and had resolution of diarrhea but had had noticed increase in gas and bloating. We reduced to 100 once daily which helped and she is now using 100 mg about every 3-4 days. Resolution of diarrhea. Dramatic improvement in symptoms which have been life changing. She has dealt with diarrhea for over 30 years. No blood in her stool or melena. Using Gas-X for abdominal bloating. She's noticed some burning epigastric pain recently. She admits to using Aleve PM for sleep. Eating slightly worsens the burning on occasion. Still with some mild dysphagia to dry foods only and she is working around this by drinking with eating.  Her insurance company after the new year has indicated Viberzi may not be covered and she has been very stressed by this possibility.  Review of Systems As per history of present illness, otherwise negative  Current Medications, Allergies, Past Medical History, Past Surgical History, Family History and Social History were reviewed in [employer] record.     Objective:   Physical Exam BP 120/80 mmHg  Pulse 101  Ht 5' 0.25" (1.53 m)  Wt 162 lb (73.483 kg)  BMI 31.39 kg/m2 Constitutional: Well-developed and well-nourished. No distress. HEENT: Normocephalic and atraumatic.  Conjunctivae are normal.  No scleral icterus. Neck: Neck supple. Trachea midline. Cardiovascular: Normal rate, regular rhythm and intact distal pulses. No M/R/G Pulmonary/chest: Effort normal and breath sounds normal. No wheezing, rales or  rhonchi. Abdominal: Soft, nontender, nondistended. Bowel sounds active throughout. There are no masses palpable. No hepatosplenomegaly. Extremities: no clubbing, cyanosis, or edema Neurological: Alert and oriented to person place and time. Skin: Skin is warm and dry.  Psychiatric: Normal mood and affect. Behavior is normal.  EGD 10/18/2013 -- stricture in the lower 30 esophagus dilated with [place]. Small hiatal hernia. Mild antral gastropathy, biopsies negative for H. pylori. Normal examined duodenum Colonoscopy 09/14/2010 -- normal. Biopsies negative for microscopic colitis. Terminal ileum biopsied and normal.    Assessment & [name]:  65 year old female with long-standing IBS with diarrhea, esophageal dysmotility with spasm, chronic gas and bloating, GERD who is here for follow-up.  1. IBS-D -- Dramatic improvement with Viberzi though she would prefer to go to the bathroom slightly more frequently than every 3-4 days. We'll dose reduce to 75 mg twice daily and she may use this once daily if this is better for her. No colonic alarm symptoms. She can continue Gas-X per box instruction for gas and bloating.  2. Epigastric discomfort -- possibly gastritis related to NSAID use. She is using Aleve PM for sleep. I advised her that the sleep component of this medication is Benadryl. She can use Benadryl 25-50 mg daily at bedtime which does not contain NSAID. Notify me if this pain feels to improve or certainly if it worsens.  3. GERD -- continue PPI at current dose. Attempted dose reduce resulted in recurrent symptoms.  6 month follow-up, sooner if necessary 25 minutes spent with the patient today. Greater than 50% was spent in counseling and coordination of care with the  patient

## 2015-12-06 ENCOUNTER — Telehealth: Payer: Self-pay

## 2015-12-06 NOTE — Telephone Encounter (Signed)
Viberzi Drug rep gave the pharmacy al the co-pay card info but they needed patient's new insurance information in order to process the rx.  Patient took Environmental consultant to pharmacy and was able to get 30 for $30.  She was told her insurance will not allow a 90 day supply.  I will continue to look into this to make sure that is the absolute cheapest it can be obtained.  In the meantime, patient will call me when she is running low and I will make sure she gets charged correctly for refills.  Patient agreed.

## 2015-12-08 ENCOUNTER — Encounter: Payer: Self-pay | Admitting: Cardiology

## 2015-12-08 ENCOUNTER — Ambulatory Visit (INDEPENDENT_AMBULATORY_CARE_PROVIDER_SITE_OTHER): Payer: BLUE CROSS/BLUE SHIELD | Admitting: Cardiology

## 2015-12-08 VITALS — BP 136/72 | HR 94 | Ht 60.5 in | Wt 163.1 lb

## 2015-12-08 DIAGNOSIS — I517 Cardiomegaly: Secondary | ICD-10-CM

## 2015-12-08 DIAGNOSIS — R9431 Abnormal electrocardiogram [ECG] [EKG]: Secondary | ICD-10-CM | POA: Diagnosis not present

## 2015-12-08 DIAGNOSIS — R0602 Shortness of breath: Secondary | ICD-10-CM

## 2015-12-08 DIAGNOSIS — R0989 Other specified symptoms and signs involving the circulatory and respiratory systems: Secondary | ICD-10-CM | POA: Diagnosis not present

## 2015-12-08 NOTE — Patient Instructions (Signed)
Medication Instructions: Dr Percival Spanish recommends that you continue on your current medications as directed. Please refer to the Current Medication list given to you today.  Labwork: NONE  Testing/Procedures: 1. AAA Duplex - Your physician has requested that you have an abdominal aorta duplex. During this test, an ultrasound is used to evaluate the aorta. Allow 30 minutes for this exam. Do not eat after midnight the day before and avoid carbonated beverages.  2. Echocardiogram - Your physician has requested that you have an echocardiogram. Echocardiography is a painless test that uses sound waves to create images of your heart. It provides your doctor with information about the size and shape of your heart and how well your heart's chambers and valves are working. This procedure takes approximately one hour. There are no restrictions for this procedure.  3. Exercise Tolerance test (GXT) - Your physician has requested that you have an exercise tolerance test. For further information please visit [email or web address]. Please also follow instruction sheet, as given.  Follow-up: Dr Percival Spanish recommends that you follow-up with him as needed.  If you need a refill on your cardiac medications before your next appointment, please call your pharmacy.

## 2015-12-08 NOTE — Progress Notes (Signed)
Cardiology Office Note   Date:  12/08/2015   ID:  Adrienne [name], DOB 09/18/1951, MRN [id number]  PCP:  Haywood Pao, MD  Cardiologist:   Minus Breeding, MD   No chief complaint on file.     History of Present Illness: Adrienne [name] is a 65 y.o. female who presents for evaluation of dyspnea. She also has an abnormal EKG with questionable LVH. She reports she's had no current cardiac as noted on EKG recently to have left ventricular hypertrophy by voltage criteria. She has had hypertension. She does report shortness of breath with activities such as climbing stairs. She does not have shortness of breath at rest. There is no PND or orthopnea. There are no palpitations, presyncope or syncope. She is not describing chest pressure, neck or arm discomfort. This has been slowly progressive. She's not had any swelling.  She does not exercise routinely.  Past Medical History  Diagnosis Date  . Esophageal spasm   . [place] esophagus   . Barrett esophagus   . Unspecified gastritis and gastroduodenitis without mention of hemorrhage   . Esophageal reflux   . Irritable bowel syndrome   . Generalized anxiety disorder   . Type II or unspecified type diabetes mellitus without mention of complication, not stated as uncontrolled   . Hyperlipidemia   . Allergy   . Arthritis   . Depression   . Hypertension   . Contact lens/glasses fitting     wears contacts or glasses  . Esophageal stricture     Past Surgical History  Procedure Laterality Date  . Tonsillectomy    . Nasal sinus surgery    . Eye surgery      right-tear duct surgery  . Colonoscopy    . Upper gastrointestinal endoscopy    . Carpal tunnel release Left 01/07/2014    Procedure: LEFT CARPAL TUNNEL RELEASE;  Surgeon: Tennis Must, MD;  Location: [facility];  Service: Orthopedics;  Laterality: Left;     Current Outpatient Prescriptions  Medication Sig Dispense Refill  . aspirin 81 MG tablet Take 81  mg by mouth daily.    . Calcium Carbonate-Vit D-Min 1200-1000 MG-UNIT CHEW Chew 1 tablet by mouth 2 (two) times daily.    . Eluxadoline (VIBERZI) 100 MG TABS Take 100 mg by mouth 2 (two) times daily. 60 tablet 3  . esomeprazole (NEXIUM) 40 MG capsule Take 40 mg by mouth 2 (two) times daily.    . Exenatide ER (BYDUREON) 2 MG SUSR Inject 2 mg into the skin once a week.    . Fe Fum-FePoly-FA-Vit C-Vit B3 (INTEGRA F) 125-1 MG CAPS Take 1 capsule by mouth daily.    Marland Kitchen imipramine (TOFRANIL) 25 MG tablet Take 25 mg by mouth at bedtime.    . insulin detemir (LEVEMIR) 100 UNIT/ML injection Inject 80 Units into the skin daily.     . isosorbide mononitrate (IMDUR) 30 MG 24 hr tablet Take 15 mg by mouth daily.    Marland Kitchen olanzapine-FLUoxetine (SYMBYAX) 6-25 MG per capsule Take 1 capsule by mouth every evening.    . ramipril (ALTACE) 10 MG tablet Take 10 mg by mouth daily.    . Saxagliptin-Metformin (KOMBIGLYZE XR) 2.01-999 MG TB24 Take 1 tablet by mouth 2 (two) times daily.    . simvastatin (ZOCOR) 40 MG tablet Take 40 mg by mouth every evening.    . [DISCONTINUED] FLUoxetine (PROZAC) 40 MG capsule Take 40 mg by mouth daily.    . [  DISCONTINUED] rosiglitazone-metformin (AVANDAMET) 12-998 MG per tablet Take 1 tablet by mouth 2 (two) times daily with a meal.     No current facility-administered medications for this visit.    Allergies:   Sulfonamide derivatives    Social History:  The patient  reports that she has never smoked. She has never used smokeless tobacco. She reports that she drinks alcohol. She reports that she does not use illicit drugs.   Family History:  The patient's family history includes AAA (abdominal aortic aneurysm) in her brother and father; CAD (age of onset: 65) in her father; Colon cancer in her maternal grandmother; Diabetes in her father and sister; Inflammatory bowel disease in her grandchild; Peripheral vascular disease (age of onset: 43) in her mother; Prostate cancer in her paternal  grandfather; Stomach cancer in her maternal grandfather. There is no history of Rectal cancer or Esophageal cancer.    ROS:  Please see the history of present illness.   Otherwise, review of systems are positive for none.   All other systems are reviewed and negative.    PHYSICAL EXAM: VS:  BP 136/72 mmHg  Pulse 94  Ht 5' 0.5" (1.537 m)  Wt 163 lb 1 oz (73.965 kg)  BMI 31.31 kg/m2 , BMI Body mass index is 31.31 kg/(m^2). GENERAL:  Well appearing HEENT:  Pupils equal round and reactive, fundi not visualized, oral mucosa unremarkable NECK:  No jugular venous distention, waveform within normal limits, carotid upstroke brisk and symmetric, no bruits, no thyromegaly LYMPHATICS:  No cervical, inguinal adenopathy LUNGS:  Clear to auscultation bilaterally BACK:  No CVA tenderness CHEST:  Unremarkable HEART:  PMI not displaced or sustained,S1 and S2 within normal limits, no S3, no S4, no clicks, no rubs, no murmurs ABD:  Flat, positive bowel sounds normal in frequency in pitch, no bruits, no rebound, no guarding, possible midline pulsatile mass, no hepatomegaly, no splenomegaly EXT:  2 plus pulses throughout, no edema, no cyanosis no clubbing SKIN:  No rashes no nodules NEURO:  Cranial nerves II through XII grossly intact, motor grossly intact throughout PSYCH:  Cognitively intact, oriented to person place and time    EKG:  EKG is ordered today. The ekg ordered today demonstrates sinus rhythm, rate 94, axis within normal limits, intervals within normal limits, no acute ST-T wave changes.   Recent Labs: No results found for requested labs within last 365 days.    Lipid Panel No results found for: CHOL, TRIG, HDL, CHOLHDL, VLDL, LDLCALC, LDLDIRECT    Wt Readings from Last 3 Encounters:  12/08/15 163 lb 1 oz (73.965 kg)  12/05/15 162 lb (73.483 kg)  08/29/15 161 lb 4 oz (73.143 kg)      Other studies Reviewed: Additional studies/ records that were reviewed today include: Office  records. Review of the above records demonstrates:  Please see elsewhere in the note.     ASSESSMENT AND PLAN:  DYSPNEA:  The patient does have cardiovascular risk factors. I will check a BNP level. I think the pretest probability of obstructive coronary disease is relatively low. I will bring the patient back for a POET (Plain Old Exercise Test). This will allow me to screen for obstructive coronary disease, risk stratify and very importantly provide a prescription for exercise.  ABNORMAL ABDOMINAL EXAM:  She has first-degree relatives with abdominal aortic aneurysm. She may have pulsatile abdominal aorta. I'm going to screen her with an abdominal ultrasound.  LVH:  The patient has an abnormal EKG suggestive of LVH. She will  get an echocardiogram.    Current medicines are reviewed at length with the patient today.  The patient does not have concerns regarding medicines.  The following changes have been made:  no change  Labs/ tests ordered today include:   Orders Placed This Encounter  Procedures  . Brain natriuretic peptide  . Exercise Tolerance Test  . EKG 12-Lead  . ECHOCARDIOGRAM COMPLETE     Disposition:   FU with me as needed.     Signed, Minus Breeding, MD  12/08/2015 4:02 PM    [facility]

## 2015-12-09 LAB — BRAIN NATRIURETIC PEPTIDE: BRAIN NATRIURETIC PEPTIDE: 35.5 pg/mL (ref <100)

## 2015-12-10 ENCOUNTER — Encounter: Payer: Self-pay | Admitting: Cardiology

## 2015-12-25 ENCOUNTER — Other Ambulatory Visit: Payer: Self-pay

## 2015-12-25 ENCOUNTER — Ambulatory Visit (HOSPITAL_COMMUNITY): Payer: BLUE CROSS/BLUE SHIELD | Attending: Cardiology

## 2015-12-25 DIAGNOSIS — I119 Hypertensive heart disease without heart failure: Secondary | ICD-10-CM | POA: Diagnosis not present

## 2015-12-25 DIAGNOSIS — E119 Type 2 diabetes mellitus without complications: Secondary | ICD-10-CM | POA: Diagnosis not present

## 2015-12-25 DIAGNOSIS — E785 Hyperlipidemia, unspecified: Secondary | ICD-10-CM | POA: Diagnosis not present

## 2015-12-25 DIAGNOSIS — I517 Cardiomegaly: Secondary | ICD-10-CM | POA: Diagnosis not present

## 2015-12-25 DIAGNOSIS — R9431 Abnormal electrocardiogram [ECG] [EKG]: Secondary | ICD-10-CM

## 2015-12-25 DIAGNOSIS — I071 Rheumatic tricuspid insufficiency: Secondary | ICD-10-CM | POA: Diagnosis not present

## 2015-12-25 DIAGNOSIS — I340 Nonrheumatic mitral (valve) insufficiency: Secondary | ICD-10-CM | POA: Insufficient documentation

## 2015-12-26 ENCOUNTER — Telehealth (HOSPITAL_COMMUNITY): Payer: Self-pay

## 2015-12-26 NOTE — Telephone Encounter (Signed)
Encounter complete. 

## 2015-12-28 ENCOUNTER — Ambulatory Visit (HOSPITAL_COMMUNITY)
Admission: RE | Admit: 2015-12-28 | Discharge: 2015-12-28 | Disposition: A | Discharge status: Home / Self Care | Payer: BLUE CROSS/BLUE SHIELD | Source: Ambulatory Visit | Attending: Cardiology | Admitting: Cardiology

## 2015-12-28 ENCOUNTER — Ambulatory Visit (HOSPITAL_BASED_OUTPATIENT_CLINIC_OR_DEPARTMENT_OTHER)
Admission: RE | Admit: 2015-12-28 | Discharge: 2015-12-28 | Disposition: A | Discharge status: Home / Self Care | Payer: BLUE CROSS/BLUE SHIELD | Source: Ambulatory Visit | Attending: Cardiology | Admitting: Cardiology

## 2015-12-28 DIAGNOSIS — R0602 Shortness of breath: Secondary | ICD-10-CM | POA: Insufficient documentation

## 2015-12-28 DIAGNOSIS — I708 Atherosclerosis of other arteries: Secondary | ICD-10-CM | POA: Insufficient documentation

## 2015-12-28 DIAGNOSIS — F329 Major depressive disorder, single episode, unspecified: Secondary | ICD-10-CM | POA: Insufficient documentation

## 2015-12-28 DIAGNOSIS — I10 Essential (primary) hypertension: Secondary | ICD-10-CM | POA: Insufficient documentation

## 2015-12-28 DIAGNOSIS — E785 Hyperlipidemia, unspecified: Secondary | ICD-10-CM | POA: Diagnosis not present

## 2015-12-28 DIAGNOSIS — R0989 Other specified symptoms and signs involving the circulatory and respiratory systems: Secondary | ICD-10-CM | POA: Diagnosis not present

## 2015-12-28 DIAGNOSIS — F419 Anxiety disorder, unspecified: Secondary | ICD-10-CM | POA: Diagnosis not present

## 2015-12-28 DIAGNOSIS — I700 Atherosclerosis of aorta: Secondary | ICD-10-CM | POA: Insufficient documentation

## 2015-12-28 DIAGNOSIS — E119 Type 2 diabetes mellitus without complications: Secondary | ICD-10-CM | POA: Diagnosis not present

## 2015-12-28 LAB — EXERCISE TOLERANCE TEST
CHL CUP MPHR: 155 {beats}/min
CHL CUP RESTING HR STRESS: 82 {beats}/min
CHL RATE OF PERCEIVED EXERTION: 16
CSEPHR: 91 %
Estimated workload: 8.5 METS
Exercise duration (min): 7 min
Peak HR: 142 {beats}/min

## 2016-01-01 ENCOUNTER — Telehealth: Payer: Self-pay | Admitting: Cardiology

## 2016-01-01 NOTE — Telephone Encounter (Signed)
F/u  Pt returning Rn phone call- test results. Please call back and discuss.

## 2016-01-01 NOTE — Telephone Encounter (Signed)
Spoke to patient.  ECHO, AAA ULTRASOUND, AND STRESS TEST Result given . Verbalized understanding

## 2016-02-29 ENCOUNTER — Telehealth: Payer: Self-pay | Admitting: *Deleted

## 2016-02-29 MED ORDER — ELUXADOLINE 75 MG PO TABS: 75.0000 mg | ORAL_TABLET | Freq: Two times a day (BID) | ORAL | Status: DC

## 2016-02-29 NOTE — Telephone Encounter (Signed)
Rx sent to [facility]

## 2016-06-20 ENCOUNTER — Telehealth: Payer: Self-pay | Admitting: Internal Medicine

## 2016-06-20 MED ORDER — ELUXADOLINE 75 MG PO TABS: 75.0000 mg | ORAL_TABLET | Freq: Two times a day (BID) | ORAL | 0 refills | Status: DC

## 2016-06-20 NOTE — Telephone Encounter (Signed)
rx sent

## 2016-06-24 DIAGNOSIS — J310 Chronic rhinitis: Secondary | ICD-10-CM | POA: Insufficient documentation

## 2016-06-24 DIAGNOSIS — H9193 Unspecified hearing loss, bilateral: Secondary | ICD-10-CM | POA: Insufficient documentation

## 2016-06-25 ENCOUNTER — Telehealth: Payer: Self-pay | Admitting: Internal Medicine

## 2016-06-25 NOTE — Telephone Encounter (Signed)
Patient states that rx is going to be $300 for viberzi. I contacted pharmacy to see if they ran coupon on file for patient. They did not. Rx is now $30 with coupon. Patient advised she needs to make pharmacy aware that she has 2 cards on file next time she requests refills.

## 2016-09-10 ENCOUNTER — Encounter: Payer: Self-pay | Admitting: Internal Medicine

## 2016-09-10 ENCOUNTER — Ambulatory Visit (INDEPENDENT_AMBULATORY_CARE_PROVIDER_SITE_OTHER): Payer: BLUE CROSS/BLUE SHIELD | Admitting: Internal Medicine

## 2016-09-10 VITALS — BP 130/64 | HR 92 | Ht 62.36 in | Wt 175.2 lb

## 2016-09-10 DIAGNOSIS — K580 Irritable bowel syndrome with diarrhea: Secondary | ICD-10-CM | POA: Diagnosis not present

## 2016-09-10 DIAGNOSIS — R1310 Dysphagia, unspecified: Secondary | ICD-10-CM

## 2016-09-10 DIAGNOSIS — K219 Gastro-esophageal reflux disease without esophagitis: Secondary | ICD-10-CM

## 2016-09-10 MED ORDER — ELUXADOLINE 75 MG PO TABS: 75.0000 mg | ORAL_TABLET | Freq: Two times a day (BID) | ORAL | 1 refills | Status: DC

## 2016-09-10 NOTE — Progress Notes (Signed)
   Subjective:    Patient ID: Adrienne [name], female    DOB: 1951-01-04, 65 y.o.   MRN: [id number]  HPI Adrienne [name] is a 65 year old female with long-standing IBS with diarrhea, esophageal dysmotility with spasm, chronic gas and bloating, GERD who is here for follow-up. She was last seen in March 2017. She reports her irritable bowel diarrhea remains under excellent control with Viberzi. We decreased the dose to 75 mg twice a day previously due to mild constipation. She is now no longer using this every day but using it 2-3 days per week. When she uses it is often twice daily. She's having 2-4 bowel movements per week with very rare if ever loose stools and no diarrhea. No accidents. She is extremely happy with this medication. No blood in her stool or melena  She was having epigastric abdominal pain which improved and resolved after discontinuation of NSAID. She continues Nexium 40 mg once daily with good control of heartburn. She has had some recurrent solid food dysphagia particularly with meats. She had prior dilation performed which was very helpful. She is requesting repeat dilation at this time. No odynophagia.  She has noticed some right back pain which is positional worse with bending over and lying on her right side. Not related to eating or bowel movement. No known trauma   Review of Systems As per history of present illness, otherwise negative  Current Medications, Allergies, Past Medical History, Past Surgical History, Family History and Social History were reviewed in [employer] record.     Objective:   Physical Exam BP 130/64   Pulse 92   Ht 5' 2.36" (1.584 m)   Wt 175 lb 4 oz (79.5 kg)   BMI 31.68 kg/m  Constitutional: Well-developed and well-nourished. No distress. HEENT: Normocephalic and atraumatic. Oropharynx is clear and moist. No oropharyngeal exudate. Conjunctivae are normal.  No scleral icterus. Neck: Neck supple. Trachea  midline. Cardiovascular: Normal rate, regular rhythm and intact distal pulses. No M/R/G Pulmonary/chest: Effort normal and breath sounds normal. No wheezing, rales or rhonchi. Abdominal: Soft, obese, nontender, nondistended. Bowel sounds active throughout.  Extremities: no clubbing, cyanosis, or edema Msk: No point tenderness over the spine or right back. No rash. Neurological: Alert and oriented to person place and time. Skin: Skin is warm and dry. No rashes noted. Psychiatric: Normal mood and affect. Behavior is normal.     Assessment & [name]:   65 year old female with long-standing IBS with diarrhea, esophageal dysmotility with spasm, chronic gas and bloating, GERD who is here for follow-up.  1. IBS-D -- continued excellent control with Viberzi. She is no longer taking this every day. She can continue Viberzi 75 mg twice a day as she is. We reviewed the risk of pancreatitis and given her excellent response she wishes to continue. I reminded her to avoid alcohol  2. Epigastric discomfort -- resolved off NSAID  3. GERD -- well-controlled with Nexium. Continue 40 mg daily  4. Dysphagia -- solid food. Responsive to prior dilation. Repeat EGD with possible dilation. We discussed the risks, benefits and alternatives and she wishes to proceed  25 minutes spent with the patient today. Greater than 50% was spent in counseling and coordination of care with the patient

## 2016-09-10 NOTE — Patient Instructions (Addendum)
You have been scheduled for an endoscopy. Please follow written instructions given to you at your visit today. If you use inhalers (even only as needed), please bring them with you on the day of your procedure. Your physician has requested that you go to www.startemmi.com and enter the access code given to you at your visit today. This web site gives a general overview about your procedure. However, you should still follow specific instructions given to you by our office regarding your preparation for the procedure.  We have sent the following medications to your pharmacy for you to pick up at your convenience: Viberzi 75 mcg daily  Continue your Nexium.  Call our office if your right sided back pain does not improve. We may need to refer you to Dr Gardenia Phlegm.  If you are age 50 or older, your body mass index should be between 23-30. Your Body mass index is 31.68 kg/m. If this is out of the aforementioned range listed, please consider follow up with your Primary Care Provider.  If you are age 61 or younger, your body mass index should be between 19-25. Your Body mass index is 31.68 kg/m. If this is out of the aformentioned range listed, please consider follow up with your Primary Care Provider.

## 2016-10-30 ENCOUNTER — Encounter: Payer: Self-pay | Admitting: Internal Medicine

## 2016-10-30 ENCOUNTER — Ambulatory Visit (AMBULATORY_SURGERY_CENTER): Payer: BLUE CROSS/BLUE SHIELD | Admitting: Internal Medicine

## 2016-10-30 VITALS — BP 109/60 | HR 73 | Temp 97.1°F | Resp 15 | Ht 62.0 in | Wt 175.0 lb

## 2016-10-30 DIAGNOSIS — K219 Gastro-esophageal reflux disease without esophagitis: Secondary | ICD-10-CM

## 2016-10-30 DIAGNOSIS — R1310 Dysphagia, unspecified: Secondary | ICD-10-CM

## 2016-10-30 HISTORY — PX: UPPER GASTROINTESTINAL ENDOSCOPY: SHX188

## 2016-10-30 LAB — GLUCOSE, CAPILLARY
GLUCOSE-CAPILLARY: 111 mg/dL — AB (ref 65–99)
Glucose-Capillary: 74 mg/dL (ref 65–99)

## 2016-10-30 MED ORDER — SODIUM CHLORIDE 0.9 % IV SOLN: 500.0000 mL | INTRAVENOUS | Status: DC

## 2016-10-30 MED ORDER — FLUCONAZOLE 100 MG PO TABS: 100.0000 mg | ORAL_TABLET | Freq: Every day | ORAL | 0 refills | Status: DC

## 2016-10-30 MED ORDER — DEXTROSE 5 % IV SOLN: INTRAVENOUS | Status: DC

## 2016-10-30 NOTE — Progress Notes (Signed)
Pt's states no medical or surgical changes since previsit or office visit. 

## 2016-10-30 NOTE — Progress Notes (Signed)
Spontaneous respirations throughout. VSS. Resting comfortably. To PACU on room air. Report to  Celia RN. 

## 2016-10-30 NOTE — Op Note (Signed)
[place] Patient Name: Adrienne [name] Procedure Date: 10/30/2016 9:21 AM MRN: [id number] Endoscopist: Jerene Bears , MD Age: 66 Referring MD:  Date of Birth: 09/08/1951 Gender: Female Account #: [id number] Procedure:                Upper GI endoscopy Indications:              Dysphagia (responsive to dilation in 2015 after                            [place] at 52 Fr), Gastro-esophageal reflux disease Medicines:                Monitored Anesthesia Care Procedure:                Pre-Anesthesia Assessment:                           - Prior to the procedure, a History and Physical                            was performed, and patient medications and                            allergies were reviewed. The patient's tolerance of                            previous anesthesia was also reviewed. The risks                            and benefits of the procedure and the sedation                            options and risks were discussed with the patient.                            All questions were answered, and informed consent                            was obtained. Prior Anticoagulants: The patient has                            taken no previous anticoagulant or antiplatelet                            agents. ASA Grade Assessment: II - A patient with                            mild systemic disease. After reviewing the risks                            and benefits, the patient was deemed in                            satisfactory condition to undergo the procedure.  After obtaining informed consent, the endoscope was                            passed under direct vision. Throughout the                            procedure, the patient's blood pressure, pulse, and                            oxygen saturations were monitored continuously. The                            Model GIF-HQ190 [id number]) scope was introduced   through the mouth, and advanced to the second part                            of duodenum. The upper GI endoscopy was                            accomplished without difficulty. The patient                            tolerated the procedure well. Scope In: Scope Out: Findings:                 Patchy candidiasis was found in the upper third of                            the esophagus and in the middle third of the                            esophagus.                           No endoscopic abnormality was evident in the                            esophagus to explain the patient's complaint of                            dysphagia. It was decided, however, to proceed with                            dilation of the entire esophagus. A guidewire was                            placed and the scope was withdrawn. Dilation was                            performed with a Savary dilator with mild                            resistance at 17 mm.  The cardia and gastric fundus were normal on                            retroflexion.                           Patchy mild inflammation characterized by erythema                            was found in the gastric body. This has been                            biopsied multiple times previously and negative for                            H. pylori, thus repeat biopsies not performed today.                           The examined duodenum was normal. Complications:            No immediate complications. Estimated Blood Loss:     Estimated blood loss: none. Impression:               - Monilial esophagitis.                           - Otherwise normal esophageal lumen with Z-line                            regular at 38 cm. Dilated with Savary to 17 mm.                           - Mild gastritis.                           - Normal examined duodenum. Recommendation:           - Patient has a contact number available for                             emergencies. The signs and symptoms of potential                            delayed complications were discussed with the                            patient. Return to normal activities tomorrow.                            Written discharge instructions were provided to the                            patient.                           - Resume previous diet.                           -  Continue present medications.                           - Diflucan (fluconazole) 200 mg PO daily x 1 day,                            then 100 mg PO daily for 13 days.                           - Repeat upper endoscopy PRN for retreatment. Jerene Bears, MD 10/30/2016 9:50:24 AM This report has been signed electronically.

## 2016-10-30 NOTE — Patient Instructions (Signed)
Discharge instructions given. Handouts on gastritis and a dilation diet. Resume previous medications. YOU HAD AN ENDOSCOPIC PROCEDURE TODAY AT [facility]:   Refer to the procedure report that was given to you for any specific questions about what was found during the examination.  If the procedure report does not answer your questions, please call your gastroenterologist to clarify.  If you requested that your care partner not be given the details of your procedure findings, then the procedure report has been included in a sealed envelope for you to review at your convenience later.  YOU SHOULD EXPECT: Some feelings of bloating in the abdomen. Passage of more gas than usual.  Walking can help get rid of the air that was put into your GI tract during the procedure and reduce the bloating. If you had a lower endoscopy (such as a colonoscopy or flexible sigmoidoscopy) you may notice spotting of blood in your stool or on the toilet paper. If you underwent a bowel prep for your procedure, you may not have a normal bowel movement for a few days.  Please Note:  You might notice some irritation and congestion in your nose or some drainage.  This is from the oxygen used during your procedure.  There is no need for concern and it should clear up in a day or so.  SYMPTOMS TO REPORT IMMEDIATELY:   Following upper endoscopy (EGD)  Vomiting of blood or coffee ground material  New chest pain or pain under the shoulder blades  Painful or persistently difficult swallowing  New shortness of breath  Fever of 100F or higher  Black, tarry-looking stools  For urgent or emergent issues, a gastroenterologist can be reached at any hour by calling [phone].   DIET:  We do recommend a small meal at first, but then you may proceed to your regular diet.  Drink plenty of fluids but you should avoid alcoholic beverages for 24 hours.  ACTIVITY:  You should plan to take it easy for the rest of today  and you should NOT DRIVE or use heavy machinery until tomorrow (because of the sedation medicines used during the test).    FOLLOW UP: Our staff will call the number listed on your records the next business day following your procedure to check on you and address any questions or concerns that you may have regarding the information given to you following your procedure. If we do not reach you, we will leave a message.  However, if you are feeling well and you are not experiencing any problems, there is no need to return our call.  We will assume that you have returned to your regular daily activities without incident.  If any biopsies were taken you will be contacted by phone or by letter within the next 1-3 weeks.  Please call us at [phone] if you have not heard about the biopsies in 3 weeks.    SIGNATURES/CONFIDENTIALITY: You and/or your care partner have signed paperwork which will be entered into your electronic medical record.  These signatures attest to the fact that that the information above on your After Visit Summary has been reviewed and is understood.  Full responsibility of the confidentiality of this discharge information lies with you and/or your care-partner.

## 2016-10-30 NOTE — Progress Notes (Signed)
Called to room to assist during endoscopic procedure.  Patient ID and intended procedure confirmed with present staff. Received instructions for my participation in the procedure from the performing physician.  

## 2016-10-31 ENCOUNTER — Telehealth: Payer: Self-pay

## 2016-10-31 NOTE — Telephone Encounter (Signed)
  Follow up Call-  Call back number 10/30/2016  Post procedure Call Back phone  # [phone]  Permission to leave phone message Yes  Some recent data might be hidden     Patient questions:  Do you have a fever, pain , or abdominal swelling? No. Pain Score  0 *  Have you tolerated food without any problems? Yes.    Have you been able to return to your normal activities? Yes.    Do you have any questions about your discharge instructions: Diet   No. Medications  No. Follow up visit  No.  Do you have questions or concerns about your Care? No.  Actions: * If pain score is 4 or above: No action needed, pain <4.

## 2016-11-19 ENCOUNTER — Other Ambulatory Visit: Payer: Self-pay | Admitting: Obstetrics and Gynecology

## 2016-11-19 DIAGNOSIS — Z1231 Encounter for screening mammogram for malignant neoplasm of breast: Secondary | ICD-10-CM

## 2016-12-03 ENCOUNTER — Ambulatory Visit
Admission: RE | Admit: 2016-12-03 | Discharge: 2016-12-03 | Disposition: A | Discharge status: Home / Self Care | Payer: BLUE CROSS/BLUE SHIELD | Source: Ambulatory Visit | Attending: Obstetrics and Gynecology | Admitting: Obstetrics and Gynecology

## 2016-12-03 DIAGNOSIS — Z1231 Encounter for screening mammogram for malignant neoplasm of breast: Secondary | ICD-10-CM

## 2016-12-04 ENCOUNTER — Telehealth: Payer: Self-pay | Admitting: Internal Medicine

## 2016-12-04 NOTE — Telephone Encounter (Signed)
I have sent a viberzi discount card to patient's home address. Patient verbalizes understanding.

## 2017-03-18 ENCOUNTER — Other Ambulatory Visit: Payer: Self-pay | Admitting: Internal Medicine

## 2017-06-10 DIAGNOSIS — I10 Essential (primary) hypertension: Secondary | ICD-10-CM | POA: Diagnosis not present

## 2017-06-10 DIAGNOSIS — F3130 Bipolar disorder, current episode depressed, mild or moderate severity, unspecified: Secondary | ICD-10-CM | POA: Diagnosis not present

## 2017-06-10 DIAGNOSIS — I208 Other forms of angina pectoris: Secondary | ICD-10-CM | POA: Diagnosis not present

## 2017-06-10 DIAGNOSIS — E1129 Type 2 diabetes mellitus with other diabetic kidney complication: Secondary | ICD-10-CM | POA: Diagnosis not present

## 2017-06-10 DIAGNOSIS — K589 Irritable bowel syndrome without diarrhea: Secondary | ICD-10-CM | POA: Diagnosis not present

## 2017-06-10 DIAGNOSIS — I517 Cardiomegaly: Secondary | ICD-10-CM | POA: Diagnosis not present

## 2017-06-10 DIAGNOSIS — M859 Disorder of bone density and structure, unspecified: Secondary | ICD-10-CM | POA: Diagnosis not present

## 2017-06-10 DIAGNOSIS — D6489 Other specified anemias: Secondary | ICD-10-CM | POA: Diagnosis not present

## 2017-06-10 DIAGNOSIS — R808 Other proteinuria: Secondary | ICD-10-CM | POA: Diagnosis not present

## 2017-06-10 DIAGNOSIS — Z6832 Body mass index (BMI) 32.0-32.9, adult: Secondary | ICD-10-CM | POA: Diagnosis not present

## 2017-06-10 DIAGNOSIS — E7800 Pure hypercholesterolemia, unspecified: Secondary | ICD-10-CM | POA: Diagnosis not present

## 2017-06-10 DIAGNOSIS — M19041 Primary osteoarthritis, right hand: Secondary | ICD-10-CM | POA: Diagnosis not present

## 2017-07-02 DIAGNOSIS — I10 Essential (primary) hypertension: Secondary | ICD-10-CM | POA: Diagnosis not present

## 2017-07-02 DIAGNOSIS — E1129 Type 2 diabetes mellitus with other diabetic kidney complication: Secondary | ICD-10-CM | POA: Diagnosis not present

## 2017-07-02 DIAGNOSIS — Z6832 Body mass index (BMI) 32.0-32.9, adult: Secondary | ICD-10-CM | POA: Diagnosis not present

## 2017-07-02 DIAGNOSIS — Z794 Long term (current) use of insulin: Secondary | ICD-10-CM | POA: Diagnosis not present

## 2017-09-01 ENCOUNTER — Other Ambulatory Visit: Payer: Self-pay

## 2017-09-01 NOTE — Patient Outreach (Signed)
[place] [facility]) Care Management  09/01/2017  ERILYN PEARMAN 1951/05/22 [id number]   [employer] High risk screen completed.  Member reports a history of diabetes. She states she is on insulin and oral medication. She states her blood sugar is controlled although sh has had a little increase in her A1C from 6 something to a little over 7.0. RNCM offered Health Coach service. Member declines stating she knows what she needs to be doing; what she is eating and that she needs to exercise adding she just retired in June.   Re: medications-Member states she is on a high tier medication, Viberzi which is costing her $85. She states it is the only medication that helps her irritable bowel syndrome. She reports receiving a letter from [facility] and states she needs to call them back to discuss.  RNCM will send referral to [place] to see if there is any assistance available. And send successful outreach letter.  Thea Silversmith, RN, MSN, [place] Coordinator Cell: [phone]

## 2017-09-03 DIAGNOSIS — H903 Sensorineural hearing loss, bilateral: Secondary | ICD-10-CM | POA: Diagnosis not present

## 2017-09-08 ENCOUNTER — Telehealth: Payer: Self-pay | Admitting: Pharmacist

## 2017-09-08 NOTE — Patient Outreach (Signed)
[place] [facility]) Care Management  09/08/2017  KOYA HUNGER 1951/09/22 [id number]  Patient was called to per referral for medication assistance with Viberzi.  Unfortunately, patient did not answer the phone. HIPAA compliant message was left on the patient's voicemail.   Patient is a 66 year old female with multiple medical conditions including but not limited to:  IBS with diarrhea, esophageal dysmotility with spasm, chronic gas, GERD, GAD, type 2 diabetes, and hyperlipidemia.  [employer] Patient Assistance Program was called on the patient's behalf.  Viberzi is offered through their program but is currently on backorder. The representative recommended an application be submitted anyway as they will approve applications based on the date they were received once the medication is available again.   Plan: Call patient back within 3 business days to follow up.  Elayne Guerin, PharmD, [place] Clinical Pharmacist [phone]

## 2017-09-10 ENCOUNTER — Other Ambulatory Visit: Payer: Self-pay | Admitting: Pharmacist

## 2017-09-10 ENCOUNTER — Ambulatory Visit: Payer: Self-pay | Admitting: Pharmacist

## 2017-09-10 DIAGNOSIS — I517 Cardiomegaly: Secondary | ICD-10-CM | POA: Diagnosis not present

## 2017-09-10 DIAGNOSIS — K589 Irritable bowel syndrome without diarrhea: Secondary | ICD-10-CM | POA: Diagnosis not present

## 2017-09-10 DIAGNOSIS — E7800 Pure hypercholesterolemia, unspecified: Secondary | ICD-10-CM | POA: Diagnosis not present

## 2017-09-10 DIAGNOSIS — M859 Disorder of bone density and structure, unspecified: Secondary | ICD-10-CM | POA: Diagnosis not present

## 2017-09-10 DIAGNOSIS — M19041 Primary osteoarthritis, right hand: Secondary | ICD-10-CM | POA: Diagnosis not present

## 2017-09-10 DIAGNOSIS — F418 Other specified anxiety disorders: Secondary | ICD-10-CM | POA: Diagnosis not present

## 2017-09-10 DIAGNOSIS — Z6831 Body mass index (BMI) 31.0-31.9, adult: Secondary | ICD-10-CM | POA: Diagnosis not present

## 2017-09-10 DIAGNOSIS — E668 Other obesity: Secondary | ICD-10-CM | POA: Diagnosis not present

## 2017-09-10 DIAGNOSIS — K224 Dyskinesia of esophagus: Secondary | ICD-10-CM | POA: Diagnosis not present

## 2017-09-10 DIAGNOSIS — D6489 Other specified anemias: Secondary | ICD-10-CM | POA: Diagnosis not present

## 2017-09-10 DIAGNOSIS — I10 Essential (primary) hypertension: Secondary | ICD-10-CM | POA: Diagnosis not present

## 2017-09-10 DIAGNOSIS — E1129 Type 2 diabetes mellitus with other diabetic kidney complication: Secondary | ICD-10-CM | POA: Diagnosis not present

## 2017-09-10 NOTE — Patient Outreach (Signed)
[place] [facility]) Care Management  09/10/2017  Adrienne [name] [birth year] [id number]  Patient was called to per referral for medication assistance with Viberzi.  Unfortunately, patient did not answer the phone. HIPAA compliant message was left with the patient's husband.   Patient is a 66 year old female with multiple medical conditions including but not limited to:  IBS with diarrhea, esophageal dysmotility with spasm, chronic gas, GERD, GAD, type 2 diabetes, and hyperlipidemia.    Plan: Call patient back within 3 business days to follow up and   Elayne Guerin, PharmD, [place] Clinical Pharmacist [phone]

## 2017-09-15 ENCOUNTER — Ambulatory Visit: Payer: Self-pay | Admitting: Pharmacist

## 2017-09-15 ENCOUNTER — Encounter: Payer: Self-pay | Admitting: Pharmacist

## 2017-09-15 ENCOUNTER — Other Ambulatory Visit: Payer: Self-pay | Admitting: Pharmacist

## 2017-09-15 DIAGNOSIS — H04123 Dry eye syndrome of bilateral lacrimal glands: Secondary | ICD-10-CM | POA: Diagnosis not present

## 2017-09-15 NOTE — Patient Outreach (Signed)
[place] [facility]) Care Management  [place]   09/15/2017  ANASTON KOEHN [birth year] [id number]  Subjective: Patient was called regarding medication assistance per referral.  HIPAA identifiers were obtained. Patient is a 66 year old female with multiple medical conditions including but not limited to:  IBS with diarrhea, esophageal dysmotility with spasm, chronic gas, GERD, GAD, and hyperlipidemia.   Objective:   Encounter Medications: Outpatient Encounter Medications as of 09/15/2017  Medication Sig  . aspirin 81 MG tablet Take 81 mg by mouth daily.  . Calcium Carbonate-Vit D-Min 1200-1000 MG-UNIT CHEW Chew 1 tablet by mouth 2 (two) times daily.  . Cetirizine HCl 10 MG CAPS Take by mouth daily.  . Cyanocobalamin (RA VITAMIN B-12 TR) 1000 MCG TBCR Take by mouth.  . esomeprazole (NEXIUM) 40 MG capsule Take 40 mg by mouth 2 (two) times daily.  . fluticasone (FLONASE) 50 MCG/ACT nasal spray Place into the nose.  . furosemide (LASIX) 20 MG tablet Take 20 mg by mouth daily.  . insulin glargine (LANTUS) 100 UNIT/ML injection Inject into the skin.  Marland Kitchen isosorbide mononitrate (IMDUR) 30 MG 24 hr tablet Take 15 mg by mouth daily.  . metFORMIN (GLUCOPHAGE) 1000 MG tablet Take 1,000 mg by mouth.  . olanzapine-FLUoxetine (SYMBYAX) 6-25 MG per capsule Take 1 capsule by mouth every evening.  . ramipril (ALTACE) 10 MG tablet Take 10 mg by mouth daily.  . simvastatin (ZOCOR) 40 MG tablet Take 40 mg by mouth every evening.  Marland Kitchen VIBERZI 75 MG TABS TAKE ONE TABLET BY MOUTH TWICE DAILY  . Fe Fum-FePoly-FA-Vit C-Vit B3 (INTEGRA F) 125-1 MG CAPS Take 1 capsule by mouth daily.  . fluconazole (DIFLUCAN) 100 MG tablet Take 1 tablet (100 mg total) by mouth daily. Take 2 tabs (200 mg) by mouth x1 day, then 1 tab (100 mg) by mouth daily for 13 days. (Patient not taking: Reported on 09/15/2017)  . imipramine (TOFRANIL) 25 MG tablet Take 25 mg by mouth at bedtime.   Facility-Administered  Encounter Medications as of 09/15/2017  Medication  . 0.9 %  sodium chloride infusion  . dextrose 5 % solution    Assessment:  Drugs sorted by system:  Neurologic/Psychologic: Symbyax Imipramine (not currently taking)  Cardiovascular: Aspirin Ramipril Simvastatin Furosemide  Endocrine: Lantus Metformin  Gastrointestinal: Viberzi Omeprazole  Pulmonary Fluticasone Cetirizine  Vitamins/Minerals: Cyanocobalamin Calcium Carbonate/Vitamin D  Medication Review Findings:  Omitted medication- Furosemide-patient reported taking Furosemide 20mg  1 tablet daily as prescribed by Dr. Osborne Casco.  Furosemide was not on the patient's active medication list.  (it was added)  Dose Discrepancy- Esomeprazole 40mg  was listed on the patient's active medication list and 1 capsule twice daily.  Patient reported only taking 1 capsule daily (dose changed to reflect what patient reported taking)  Medication Assistance Findings: -patient and her husband are over income for the "Extra Help" Program offered through [employer].  -patient may qualify to receive Viberzi from [employer] Patient Assistance Program  -patient communicated understanding that she will need to provide financial documentation for the application to be approved.  -since it is the end of the year and she has enough Viberzi to last until the new year, patient decided to complete her application in the new year.  Plan: Route note to the patient's PCP. Route note to Dr. Luiz Blare (prescriber of Viberzi) Follow up with patient 10/09/17 Forward letter to CPhT, Doreene Burke for an [employer] application to be sent to the patient.  Elayne Guerin, PharmD, BCACP [facility] Clinical  Pharmacist [phone]

## 2017-09-19 ENCOUNTER — Ambulatory Visit: Payer: Self-pay | Admitting: Pharmacist

## 2017-09-25 DIAGNOSIS — H2513 Age-related nuclear cataract, bilateral: Secondary | ICD-10-CM | POA: Diagnosis not present

## 2017-09-25 DIAGNOSIS — H524 Presbyopia: Secondary | ICD-10-CM | POA: Diagnosis not present

## 2017-09-25 DIAGNOSIS — H52222 Regular astigmatism, left eye: Secondary | ICD-10-CM | POA: Diagnosis not present

## 2017-09-25 DIAGNOSIS — H5213 Myopia, bilateral: Secondary | ICD-10-CM | POA: Diagnosis not present

## 2017-09-30 DIAGNOSIS — M6530 Trigger finger, unspecified finger: Secondary | ICD-10-CM

## 2017-09-30 HISTORY — DX: Trigger finger, unspecified finger: M65.30

## 2017-10-07 ENCOUNTER — Other Ambulatory Visit: Payer: Self-pay | Admitting: Pharmacy Technician

## 2017-10-07 NOTE — Patient Outreach (Signed)
[place] [facility]) Care Management  10/07/2017  NIKKIE LIMING Jun 06, 1951 [id number]   Successful patient outreach call in reference to [place] patient assistance application for Viberzi that was mailed last month. HIPAA identifiers verified and verbal consent received. The patient has received the application but has not completed it. I went over the application with her and what we need in order to submit her application for evaluation. She has my contact information and will contact me with any questions or concerns.  Doreene Burke, [place] [phone]

## 2017-10-09 ENCOUNTER — Ambulatory Visit: Payer: Self-pay | Admitting: Pharmacist

## 2017-10-16 ENCOUNTER — Other Ambulatory Visit: Payer: Self-pay | Admitting: Pharmacy Technician

## 2017-10-16 NOTE — Patient Outreach (Signed)
[place] [facility]) Care Management  10/16/2017  CHANTILLY LINSKEY 01/09/1951 [id number]  Unsuccessful patient outreach call in reference to [place] patient assistance application for Viberzi. HIPAA compliant voicemail left for patient. I last spoke with her on 10/07/17 and per our previous conversation she had received the application and was going to complete and return it to [facility]. I have not received the application as of yet.  Doreene Burke, [place] [phone]

## 2017-10-17 ENCOUNTER — Ambulatory Visit: Payer: Self-pay | Admitting: Pharmacy Technician

## 2017-10-21 ENCOUNTER — Other Ambulatory Visit: Payer: Self-pay | Admitting: Pharmacy Technician

## 2017-10-21 DIAGNOSIS — I10 Essential (primary) hypertension: Secondary | ICD-10-CM | POA: Diagnosis not present

## 2017-10-21 DIAGNOSIS — Z794 Long term (current) use of insulin: Secondary | ICD-10-CM | POA: Diagnosis not present

## 2017-10-21 DIAGNOSIS — E1129 Type 2 diabetes mellitus with other diabetic kidney complication: Secondary | ICD-10-CM | POA: Diagnosis not present

## 2017-10-21 DIAGNOSIS — Z6831 Body mass index (BMI) 31.0-31.9, adult: Secondary | ICD-10-CM | POA: Diagnosis not present

## 2017-10-21 NOTE — Patient Outreach (Signed)
[place] [facility]) Care Management  10/21/2017  Adrienne [name] 07-23-1951 [id number]  Successful patient outreach call in reference to [place] patient assistance application for Viberzi. HIPAA identifiers verified and verbal consent received. Patient states she still has not completed the application and has some questions. She has an appointment and could not go over the application at this time but states she will call me later today.  Doreene Burke, [place] [phone]

## 2017-10-27 ENCOUNTER — Other Ambulatory Visit: Payer: Self-pay | Admitting: Pharmacy Technician

## 2017-10-27 NOTE — Patient Outreach (Signed)
Adrienne [name] [facility]) Care Management  10/27/2017  Adrienne [name] 1950/10/25 [id number]  Incoming call from patient in reference to [name] patient assistance application for Viberzi. HIPAA identifiers verified and verbal consent received. The patient is in the process of completing the application that I mailed to her and had a few questions. I was able to go over the application with her in order for her to complete the patient portion. She will mail the completed application to me once she obtains the required documentation that is needed to go along with it.   Doreene Burke, [place] [phone]

## 2017-10-29 ENCOUNTER — Other Ambulatory Visit: Payer: Self-pay | Admitting: Orthopedic Surgery

## 2017-10-29 DIAGNOSIS — M65331 Trigger finger, right middle finger: Secondary | ICD-10-CM | POA: Diagnosis not present

## 2017-10-29 DIAGNOSIS — M65321 Trigger finger, right index finger: Secondary | ICD-10-CM | POA: Diagnosis not present

## 2017-10-30 ENCOUNTER — Encounter (HOSPITAL_BASED_OUTPATIENT_CLINIC_OR_DEPARTMENT_OTHER): Payer: Self-pay | Admitting: *Deleted

## 2017-10-30 ENCOUNTER — Other Ambulatory Visit: Payer: Self-pay

## 2017-10-30 NOTE — Pre-Procedure Instructions (Signed)
To come for BMET and EKG 

## 2017-10-31 ENCOUNTER — Encounter (HOSPITAL_BASED_OUTPATIENT_CLINIC_OR_DEPARTMENT_OTHER)
Admission: RE | Admit: 2017-10-31 | Discharge: 2017-10-31 | Disposition: A | Discharge status: Home / Self Care | Payer: PPO | Source: Ambulatory Visit | Attending: Orthopedic Surgery | Admitting: Orthopedic Surgery

## 2017-10-31 DIAGNOSIS — Z01812 Encounter for preprocedural laboratory examination: Secondary | ICD-10-CM | POA: Diagnosis not present

## 2017-10-31 DIAGNOSIS — M6530 Trigger finger, unspecified finger: Secondary | ICD-10-CM | POA: Diagnosis not present

## 2017-10-31 DIAGNOSIS — Z01810 Encounter for preprocedural cardiovascular examination: Secondary | ICD-10-CM | POA: Insufficient documentation

## 2017-10-31 LAB — BASIC METABOLIC PANEL
Anion gap: 11 (ref 5–15)
BUN: 13 mg/dL (ref 6–20)
CO2: 25 mmol/L (ref 22–32)
CREATININE: 0.87 mg/dL (ref 0.44–1.00)
Calcium: 9.4 mg/dL (ref 8.9–10.3)
Chloride: 103 mmol/L (ref 101–111)
GFR calc Af Amer: >60 mL/min (ref >60)
GLUCOSE: 140 mg/dL — AB (ref 65–99)
Potassium: 4.1 mmol/L (ref 3.5–5.1)
SODIUM: 139 mmol/L (ref 135–145)

## 2017-10-31 NOTE — Progress Notes (Signed)
EKG reviewed by Dr. Miller, will proceed with surgery as scheduled.  

## 2017-11-06 ENCOUNTER — Ambulatory Visit (HOSPITAL_BASED_OUTPATIENT_CLINIC_OR_DEPARTMENT_OTHER): Payer: PPO | Admitting: Anesthesiology

## 2017-11-06 ENCOUNTER — Other Ambulatory Visit: Payer: Self-pay

## 2017-11-06 ENCOUNTER — Ambulatory Visit (HOSPITAL_BASED_OUTPATIENT_CLINIC_OR_DEPARTMENT_OTHER)
Admission: RE | Admit: 2017-11-06 | Discharge: 2017-11-06 | Disposition: A | Discharge status: Home / Self Care | Payer: PPO | Source: Ambulatory Visit | Attending: Orthopedic Surgery | Admitting: Orthopedic Surgery

## 2017-11-06 ENCOUNTER — Encounter (HOSPITAL_BASED_OUTPATIENT_CLINIC_OR_DEPARTMENT_OTHER)
Admission: RE | Disposition: A | Discharge status: Home / Self Care | Payer: Self-pay | Source: Ambulatory Visit | Attending: Orthopedic Surgery

## 2017-11-06 ENCOUNTER — Encounter (HOSPITAL_BASED_OUTPATIENT_CLINIC_OR_DEPARTMENT_OTHER): Payer: Self-pay | Admitting: *Deleted

## 2017-11-06 DIAGNOSIS — M65331 Trigger finger, right middle finger: Secondary | ICD-10-CM | POA: Diagnosis not present

## 2017-11-06 DIAGNOSIS — M65321 Trigger finger, right index finger: Secondary | ICD-10-CM | POA: Insufficient documentation

## 2017-11-06 DIAGNOSIS — E119 Type 2 diabetes mellitus without complications: Secondary | ICD-10-CM | POA: Diagnosis not present

## 2017-11-06 DIAGNOSIS — Z7982 Long term (current) use of aspirin: Secondary | ICD-10-CM | POA: Insufficient documentation

## 2017-11-06 DIAGNOSIS — E785 Hyperlipidemia, unspecified: Secondary | ICD-10-CM | POA: Diagnosis not present

## 2017-11-06 DIAGNOSIS — Z794 Long term (current) use of insulin: Secondary | ICD-10-CM | POA: Insufficient documentation

## 2017-11-06 DIAGNOSIS — I10 Essential (primary) hypertension: Secondary | ICD-10-CM | POA: Insufficient documentation

## 2017-11-06 DIAGNOSIS — K589 Irritable bowel syndrome without diarrhea: Secondary | ICD-10-CM | POA: Insufficient documentation

## 2017-11-06 DIAGNOSIS — Z79899 Other long term (current) drug therapy: Secondary | ICD-10-CM | POA: Insufficient documentation

## 2017-11-06 DIAGNOSIS — E109 Type 1 diabetes mellitus without complications: Secondary | ICD-10-CM | POA: Diagnosis not present

## 2017-11-06 HISTORY — DX: Dermatitis, unspecified: L30.9

## 2017-11-06 HISTORY — DX: Type 2 diabetes mellitus without complications: E11.9

## 2017-11-06 HISTORY — DX: Reserved for inherently not codable concepts without codable children: IMO0001

## 2017-11-06 HISTORY — DX: Family history of other specified conditions: Z84.89

## 2017-11-06 HISTORY — DX: Long term (current) use of insulin: Z79.4

## 2017-11-06 HISTORY — DX: Dental restoration status: Z98.811

## 2017-11-06 HISTORY — DX: Other seasonal allergic rhinitis: J30.2

## 2017-11-06 HISTORY — DX: Presence of external hearing-aid: Z97.4

## 2017-11-06 HISTORY — DX: Dysphagia, unspecified: R13.10

## 2017-11-06 HISTORY — DX: Trigger finger, unspecified finger: M65.30

## 2017-11-06 HISTORY — PX: TRIGGER FINGER RELEASE: SHX641

## 2017-11-06 LAB — GLUCOSE, CAPILLARY
Glucose-Capillary: 87 mg/dL (ref 65–99)
Glucose-Capillary: 94 mg/dL (ref 65–99)

## 2017-11-06 SURGERY — RELEASE, A1 PULLEY, FOR TRIGGER FINGER
Anesthesia: Regional | Site: Hand | Laterality: Right

## 2017-11-06 MED ORDER — LIDOCAINE HCL (CARDIAC) 20 MG/ML IV SOLN
INTRAVENOUS | Status: DC | PRN
  Administered 2017-11-06: 50 mg via INTRAVENOUS

## 2017-11-06 MED ORDER — BUPIVACAINE HCL (PF) 0.25 % IJ SOLN
INTRAMUSCULAR | Status: DC | PRN
  Administered 2017-11-06: 8 mL

## 2017-11-06 MED ORDER — HYDROCODONE-ACETAMINOPHEN 7.5-325 MG PO TABS: 1.0000 | ORAL_TABLET | Freq: Once | ORAL | Status: DC | PRN

## 2017-11-06 MED ORDER — HYDROCODONE-ACETAMINOPHEN 5-325 MG PO TABS: ORAL_TABLET | ORAL | 0 refills | Status: DC

## 2017-11-06 MED ORDER — DEXAMETHASONE SODIUM PHOSPHATE 10 MG/ML IJ SOLN
INTRAMUSCULAR | Status: DC | PRN
  Administered 2017-11-06: 5 mg via INTRAVENOUS

## 2017-11-06 MED ORDER — FENTANYL CITRATE (PF) 100 MCG/2ML IJ SOLN
INTRAMUSCULAR | Status: AC
  Filled 2017-11-06: qty 2

## 2017-11-06 MED ORDER — DEXAMETHASONE SODIUM PHOSPHATE 10 MG/ML IJ SOLN
INTRAMUSCULAR | Status: AC
  Filled 2017-11-06: qty 1

## 2017-11-06 MED ORDER — HYDROMORPHONE HCL 1 MG/ML IJ SOLN: 0.2500 mg | INTRAMUSCULAR | Status: DC | PRN

## 2017-11-06 MED ORDER — PROPOFOL 500 MG/50ML IV EMUL
INTRAVENOUS | Status: DC | PRN
  Administered 2017-11-06: 100 ug/kg/min via INTRAVENOUS

## 2017-11-06 MED ORDER — PROMETHAZINE HCL 25 MG/ML IJ SOLN: 6.2500 mg | INTRAMUSCULAR | Status: DC | PRN

## 2017-11-06 MED ORDER — CHLORHEXIDINE GLUCONATE 4 % EX LIQD: 60.0000 mL | Freq: Once | CUTANEOUS | Status: DC

## 2017-11-06 MED ORDER — LIDOCAINE HCL (PF) 0.5 % IJ SOLN
INTRAMUSCULAR | Status: DC | PRN
  Administered 2017-11-06: 35 mL via INTRAVENOUS

## 2017-11-06 MED ORDER — ONDANSETRON HCL 4 MG/2ML IJ SOLN
INTRAMUSCULAR | Status: DC | PRN
  Administered 2017-11-06: 4 mg via INTRAVENOUS

## 2017-11-06 MED ORDER — ACETAMINOPHEN 10 MG/ML IV SOLN
1000.0000 mg | Freq: Once | INTRAVENOUS | Status: DC | PRN
Start: 2017-11-06 — End: 2017-11-06

## 2017-11-06 MED ORDER — SCOPOLAMINE 1 MG/3DAYS TD PT72: 1.0000 | MEDICATED_PATCH | Freq: Once | TRANSDERMAL | Status: DC | PRN

## 2017-11-06 MED ORDER — MIDAZOLAM HCL 2 MG/2ML IJ SOLN
INTRAMUSCULAR | Status: AC
  Filled 2017-11-06: qty 2

## 2017-11-06 MED ORDER — CEFAZOLIN SODIUM-DEXTROSE 2-4 GM/100ML-% IV SOLN
2.0000 g | INTRAVENOUS | Status: AC
  Administered 2017-11-06: 2 g via INTRAVENOUS

## 2017-11-06 MED ORDER — MEPERIDINE HCL 25 MG/ML IJ SOLN: 6.2500 mg | INTRAMUSCULAR | Status: DC | PRN

## 2017-11-06 MED ORDER — ONDANSETRON HCL 4 MG/2ML IJ SOLN
INTRAMUSCULAR | Status: AC
  Filled 2017-11-06: qty 2

## 2017-11-06 MED ORDER — FENTANYL CITRATE (PF) 100 MCG/2ML IJ SOLN
50.0000 ug | INTRAMUSCULAR | Status: DC | PRN
  Administered 2017-11-06: 50 ug via INTRAVENOUS

## 2017-11-06 MED ORDER — LACTATED RINGERS IV SOLN
INTRAVENOUS | Status: DC
  Administered 2017-11-06: 12:00:00 via INTRAVENOUS

## 2017-11-06 MED ORDER — CEFAZOLIN SODIUM-DEXTROSE 2-4 GM/100ML-% IV SOLN
INTRAVENOUS | Status: AC
  Filled 2017-11-06: qty 100

## 2017-11-06 MED ORDER — MIDAZOLAM HCL 2 MG/2ML IJ SOLN
1.0000 mg | INTRAMUSCULAR | Status: DC | PRN
  Administered 2017-11-06: 1 mg via INTRAVENOUS

## 2017-11-06 SURGICAL SUPPLY — 30 items
BANDAGE COBAN STERILE 2 (GAUZE/BANDAGES/DRESSINGS) ×3 IMPLANT
BLADE SURG 15 STRL LF DISP TIS (BLADE) ×2 IMPLANT
BLADE SURG 15 STRL SS (BLADE) ×4
BNDG ESMARK 4X9 LF (GAUZE/BANDAGES/DRESSINGS) IMPLANT
CHLORAPREP W/TINT 26ML (MISCELLANEOUS) ×3 IMPLANT
CORD BIPOLAR FORCEPS 12FT (ELECTRODE) ×3 IMPLANT
COVER BACK TABLE 60X90IN (DRAPES) ×3 IMPLANT
COVER MAYO STAND STRL (DRAPES) ×3 IMPLANT
CUFF TOURNIQUET SINGLE 18IN (TOURNIQUET CUFF) ×3 IMPLANT
DRAPE EXTREMITY T 121X128X90 (DRAPE) ×3 IMPLANT
DRAPE SURG 17X23 STRL (DRAPES) ×3 IMPLANT
GAUZE SPONGE 4X4 12PLY STRL (GAUZE/BANDAGES/DRESSINGS) ×3 IMPLANT
GAUZE XEROFORM 1X8 LF (GAUZE/BANDAGES/DRESSINGS) ×3 IMPLANT
GLOVE BIO SURGEON STRL SZ 6.5 (GLOVE) ×2 IMPLANT
GLOVE BIO SURGEON STRL SZ7.5 (GLOVE) ×3 IMPLANT
GLOVE BIO SURGEONS STRL SZ 6.5 (GLOVE) ×1
GLOVE BIOGEL PI IND STRL 8 (GLOVE) ×1 IMPLANT
GLOVE BIOGEL PI INDICATOR 8 (GLOVE) ×2
GOWN STRL REUS W/ TWL LRG LVL3 (GOWN DISPOSABLE) ×1 IMPLANT
GOWN STRL REUS W/TWL LRG LVL3 (GOWN DISPOSABLE) ×2
GOWN STRL REUS W/TWL XL LVL3 (GOWN DISPOSABLE) ×3 IMPLANT
NEEDLE HYPO 25X1 1.5 SAFETY (NEEDLE) ×3 IMPLANT
NS IRRIG 1000ML POUR BTL (IV SOLUTION) ×3 IMPLANT
PACK BASIN DAY SURGERY FS (CUSTOM PROCEDURE TRAY) ×3 IMPLANT
STOCKINETTE 4X48 STRL (DRAPES) ×3 IMPLANT
SUT ETHILON 4 0 PS 2 18 (SUTURE) ×3 IMPLANT
SYR BULB 3OZ (MISCELLANEOUS) ×3 IMPLANT
SYR CONTROL 10ML LL (SYRINGE) ×3 IMPLANT
TOWEL OR 17X24 6PK STRL BLUE (TOWEL DISPOSABLE) ×6 IMPLANT
UNDERPAD 30X30 (UNDERPADS AND DIAPERS) ×3 IMPLANT

## 2017-11-06 NOTE — Anesthesia Procedure Notes (Signed)
Procedure Name: MAC Performed by: Terrance Mass, CRNA Pre-anesthesia Checklist: Patient identified, Timeout performed, Emergency Drugs available, Patient being monitored and Suction available Oxygen Delivery Method: Simple face mask

## 2017-11-06 NOTE — Anesthesia Postprocedure Evaluation (Signed)
Anesthesia Post Note  Patient: Adrienne [name]  Procedure(s) Performed: RELEASE TRIGGER FINGER/A-1 PULLEY RIGHT INDEX FINGER AND RIGHT MIDDLE FINGER (Right Hand)     Patient location during evaluation: PACU Anesthesia Type: Bier Block Level of consciousness: awake and alert Pain management: pain level controlled Vital Signs Assessment: post-procedure vital signs reviewed and stable Respiratory status: spontaneous breathing, nonlabored ventilation, respiratory function stable and patient connected to nasal cannula oxygen Cardiovascular status: stable and blood pressure returned to baseline Postop Assessment: no apparent nausea or vomiting Anesthetic complications: no    Last Vitals:  Vitals:   11/06/17 1400 11/06/17 1415  BP: (!) 104/57 (!) 108/52  Pulse: 78 76  Resp: 20 13  Temp:    SpO2: 97% 96%    Last Pain:  Vitals:   11/06/17 1400  TempSrc:   PainSc: 0-No pain                 Barnet Glasgow

## 2017-11-06 NOTE — H&P (Signed)
Adrienne [name] is an 67 y.o. female.   Chief Complaint: right index and long finger trigger digits HPI: 67 yo female with right index and long finger trigger digits.  Injection of the long finger did not provide lasting resolution.  She wishes to have trigger release of right index and long fingers.  Allergies:  Allergies  Allergen Reactions  . Sulfa Antibiotics Rash    Past Medical History:  Diagnosis Date  . Arthritis    hands/fingers  . Dental crowns present   . Depression   . Difficulty swallowing solids    due to esophageal stricture  . Eczema   . Esophageal reflux   . Esophageal stricture   . Family history of adverse reaction to anesthesia    pt's mother had hx. of N/V and being hard to wake up post-op  . Generalized anxiety disorder   . Hyperlipidemia   . Hypertension    states under control with med., has been on med. x 30 yr.  . Insulin dependent diabetes mellitus ([place])   . Irritable bowel syndrome   . Nutcracker esophagus   . Seasonal allergies   . Trigger finger of right hand 09/2017   index and middle fingers  . Wears hearing aid in both ears     Past Surgical History:  Procedure Laterality Date  . CARPAL TUNNEL RELEASE Left 01/07/2014   Procedure: LEFT CARPAL TUNNEL RELEASE;  Surgeon: Tennis Must, MD;  Location: [place];  Service: Orthopedics;  Laterality: Left;  . COLONOSCOPY    . ESOPHAGEAL MANOMETRY  05/28/2006  . EYE SURGERY Right    tear duct repair  . NASAL SINUS SURGERY    . TONSILLECTOMY    . UPPER GASTROINTESTINAL ENDOSCOPY  10/18/2013   with dilation; with Propofol  . UPPER GASTROINTESTINAL ENDOSCOPY  10/30/2016    Family History: Family History  Problem Relation Age of Onset  . Colon cancer Maternal Grandmother   . Stomach cancer Maternal Grandfather   . Prostate cancer Paternal Grandfather   . Diabetes Sister   . Diabetes Father   . CAD Father 55  . AAA (abdominal aortic aneurysm) Father   . Peripheral  vascular disease Mother 92  . Anesthesia problems Mother        post-op N/V; hard to wake up post-op  . Inflammatory bowel disease Grandchild   . AAA (abdominal aortic aneurysm) Brother     Social History:   reports that  has never smoked. she has never used smokeless tobacco. She reports that she drinks alcohol. She reports that she does not use drugs.  Medications: Medications Prior to Admission  Medication Sig Dispense Refill  . aspirin 81 MG tablet Take 81 mg by mouth daily.    . calcium carbonate (OS-CAL) 600 MG TABS tablet Take 600 mg by mouth 2 (two) times daily with a meal.    . Cyanocobalamin (RA VITAMIN B-12 TR) 1000 MCG TBCR Take by mouth.    . Dulaglutide (TRULICITY) 1.5 HC/6.2BJ SOPN Inject 1.5 mg into the skin once a week. WEDNESDAY    . Eluxadoline (VIBERZI) 75 MG TABS Take 37.5 mg by mouth.    . empagliflozin (JARDIANCE) 25 MG TABS tablet Take 25 mg by mouth daily.    Marland Kitchen esomeprazole (NEXIUM) 40 MG capsule Take 40 mg by mouth daily.     . insulin glargine (LANTUS) 100 UNIT/ML injection Inject 40 Units into the skin daily.     . isosorbide mononitrate (IMDUR) 30 MG  24 hr tablet Take 15 mg by mouth daily.    . metFORMIN (GLUCOPHAGE) 1000 MG tablet Take 1,000 mg by mouth 2 (two) times daily with a meal.     . Multiple Vitamin (MULTIVITAMIN) tablet Take 1 tablet by mouth daily.    Marland Kitchen olanzapine-FLUoxetine (SYMBYAX) 6-25 MG per capsule Take 1 capsule by mouth every evening.    . ramipril (ALTACE) 10 MG tablet Take 10 mg by mouth daily.    . simvastatin (ZOCOR) 40 MG tablet Take 40 mg by mouth every evening.      No results found for this or any previous visit (from the past 48 hour(s)).  No results found.   A comprehensive review of systems was negative.  Blood pressure (!) 108/45, pulse 79, temperature 98.2 F (36.8 C), temperature source Oral, resp. rate 18, height 5' (1.524 m), weight 73.9 kg (163 lb), SpO2 97 %.  General appearance: alert, cooperative and appears  stated age Head: Normocephalic, without obvious abnormality, atraumatic Neck: supple, symmetrical, trachea midline Heart: regular rate rhythm Lungs: clear to auscultation Extremities: Intact sensation and capillary refill all digits.  +epl/fpl/io.  No wounds.  Pulses: 2+ and symmetric Skin: Skin color, texture, turgor normal. No rashes or lesions Neurologic: Grossly normal Incision/Wound:none  Assessment/Plan Right index and long finger trigger digits.  Non operative and operative treatment options were discussed with the patient and patient wishes to proceed with operative treatment. Risks, benefits, and alternatives of surgery were discussed and the patient agrees with the plan of care.   Suhail Peloquin R 11/06/2017, 11:37 AM

## 2017-11-06 NOTE — Transfer of Care (Signed)
Immediate Anesthesia Transfer of Care Note  Patient: Adrienne [name]  Procedure(s) Performed: RELEASE TRIGGER FINGER/A-1 PULLEY RIGHT INDEX FINGER AND RIGHT MIDDLE FINGER (Right Hand)  Patient Location: PACU  Anesthesia Type:MAC and Bier block  Level of Consciousness: awake and sedated  Airway & Oxygen Therapy: Patient Spontanous Breathing  Post-op Assessment: Report given to RN and Post -op Vital signs reviewed and stable  Post vital signs: Reviewed and stable  Last Vitals:  Vitals:   11/06/17 1131  BP: (!) 108/45  Pulse: 79  Resp: 18  Temp: 36.8 C  SpO2: 97%    Last Pain:  Vitals:   11/06/17 1131  TempSrc: Oral  PainSc: 6       Patients Stated Pain Goal: 3 (37/34/28 7681)  Complications: No apparent anesthesia complications

## 2017-11-06 NOTE — Op Note (Signed)
11/06/2017 [facility]  Operative Note  PREOPERATIVE DIAGNOSIS: RIGHT INDEX FINGER TRIGGER AND RIGHT MIDDLE FINGER TRIGGER  POSTOPERATIVE DIAGNOSIS:  RIGHT INDEX FINGER TRIGGER AND RIGHT MIDDLE FINGER   PROCEDURE: Procedure(s): 1.  RIGHT INDEX FINGER trigger release 2. RIGHT MIDDLE FINGER trigger release  SURGEON:  Leanora Cover, MD  ASSISTANT:  none.  ANESTHESIA:  Bier block and sedation.  IV FLUIDS:  Per anesthesia flow sheet.  ESTIMATED BLOOD LOSS:  Minimal.  COMPLICATIONS:  None.  SPECIMENS:  None.  TOURNIQUET TIME:  Total Tourniquet Time Documented: Forearm (Right) - 22 minutes Total: Forearm (Right) - 22 minutes   DISPOSITION:  Stable to PACU.  LOCATION: [facility]  INDICATIONS: Adrienne [name] is a 67 y.o. female with triggering right index and long fingers.  Injection of the long finger did not provide lasting relief.  She wishes to have trigger releases.  Risks, benefits and alternatives of surgery were discussed including the risk of blood loss, infection, damage to nerves, vessels, tendons, ligaments, bone, failure of surgery, need for additional surgery, complications with wound healing, continued pain, continued triggering and need for repeat surgery.  She voiced understanding of these risks and elected to proceed.  OPERATIVE COURSE:  After being identified preoperatively by myself, the patient and I agreed upon the procedure and site of procedure.  The surgical site was marked. The risks, benefits, and alternatives of surgery were reviewed and she wished to proceed.  Surgical consent had been signed. She was given IV Ancef as preoperative antibiotic prophylaxis. She was transported to the operating room and placed on the operating room table in supine position with the Right upper extremity on an arm board. Bier block and sedation was induced by the anesthesiologist.  The Right upper extremity was prepped and draped in normal sterile  orthopedic fashion. A surgical pause was performed between surgeons, anesthesia, and operating room staff, and all were in agreement as to the patient, procedure, and site of procedure.  Tourniquet at the proximal aspect of the forearm had been inflated for the Bier block.   An incision was made at the volar aspect of the MP joint of the index finger.  This was carried into the subcutaneous tissues by preading technique.  Bipolar electrocautery was used to obtain hemostasis.  The radial and ulnar digital nerves were protected throughout the case. The flexor sheath was identified.  The A1 pulley was identified and sharply incised.  It was released in its entirety.  The proximal 1-2 mm of the A2 pulley was vented to allow better excursion of the tendons.  The finger was placed through a range of motion and there was noted to be no catching.  The tendons were brought through the wound and any adherences released.  An incision was then made at the volar aspect of the MP joint of the long finger.  This was carried into the subcutaneous tissues by preading technique.  Bipolar electrocautery was used to obtain hemostasis.  The radial and ulnar digital nerves were protected throughout the case. The flexor sheath was identified.  The A1 pulley was identified and sharply incised.  It was released in its entirety.  The proximal 1-2 mm of the A2 pulley was vented to allow better excursion of the tendons.  The finger was placed through a range of motion and there was noted to be no catching.  The tendons were brought through the wound and any adherences released.  The wounds were then copiously  irrigated with sterile saline. They were closed with 4-0 nylon in a horizontal mattress fashion.  They were injected with 0.25% plain Marcaine to aid in postoperative analgesia.  They were dressed with sterile Xeroform, 4x4s, and wrapped lightly with a Coban dressing.  Tourniquet was deflated at 22 minutes.  The fingertips were pink with  brisk capillary refill after deflation of the tourniquet.  The operative drapes were broken down and the patient was awoken from anesthesia safely.  She was transferred back to the stretcher and taken to the PACU in stable condition.   I will see her back in the office in 1 week for postoperative followup.  I will give her a prescription for norco 5/325 1-2 tabs po q6 hours prn pain, dispense #20.    Tennis Must, MD Electronically signed, 11/06/17

## 2017-11-06 NOTE — Anesthesia Preprocedure Evaluation (Signed)
Anesthesia Evaluation  Patient identified by MRN, date of birth, ID band Patient awake    Reviewed: Allergy & Precautions, NPO status , Patient's Chart, lab work & pertinent test results  Airway Mallampati: I  TM Distance: >3 FB Neck ROM: Full    Dental no notable dental hx.    Pulmonary neg pulmonary ROS,    Pulmonary exam normal        Cardiovascular hypertension,  Rhythm:Regular Rate:Normal     Neuro/Psych negative neurological ROS  negative psych ROS   GI/Hepatic negative GI ROS, Neg liver ROS,   Endo/Other  diabetes, Well Controlled, Type 1, Insulin Dependent  Renal/GU negative Renal ROS  negative genitourinary   Musculoskeletal negative musculoskeletal ROS (+)   Abdominal   Peds  Hematology negative hematology ROS (+)   Anesthesia Other Findings   Reproductive/Obstetrics                             Anesthesia Physical Anesthesia Plan  ASA: III  Anesthesia Plan: Bier Block and Bier Block-LIDOCAINE ONLY   Post-op Pain Management:    Induction:   PONV Risk Score and Plan: Treatment may vary due to age or medical condition  Airway Management Planned: Mask, Natural Airway and Nasal Cannula  Additional Equipment:   Intra-op Plan:   Post-operative Plan:   Informed Consent: I have reviewed the patients History and Physical, chart, labs and discussed the procedure including the risks, benefits and alternatives for the proposed anesthesia with the patient or authorized representative who has indicated his/her understanding and acceptance.   Dental advisory given  Plan Discussed with: CRNA and Anesthesiologist  Anesthesia Plan Comments:         Anesthesia Quick Evaluation

## 2017-11-06 NOTE — Discharge Instructions (Addendum)

## 2017-11-06 NOTE — Anesthesia Procedure Notes (Signed)
Anesthesia Regional Block: Bier block (IV Regional)   Pre-Anesthetic Checklist: ,, timeout performed, Correct Patient, Correct Site, Correct Laterality, Correct Procedure,, site marked, surgical consent,, at surgeon's request Needles:  Injection technique: Single-shot  Needle Type: Other      Needle Gauge: 20     Additional Needles:   Procedures:,,,,, intact distal pulses, Esmarch exsanguination, single tourniquet utilized,  Narrative:   Performed by: [employer]

## 2017-11-06 NOTE — Brief Op Note (Signed)
11/06/2017  1:36 PM  PATIENT:  Adrienne [name]  67 y.o. female  PRE-OPERATIVE DIAGNOSIS:  RIGHT INDEX FINGER TRIGGER AND RIGHT MIDDLE FINGER TRIGGER  POST-OPERATIVE DIAGNOSIS:  RIGHT INDEX FINGER TRIGGER AND RIGHT MIDDLE FINGER   PROCEDURE:  Procedure(s): RELEASE TRIGGER FINGER/A-1 PULLEY RIGHT INDEX FINGER AND RIGHT MIDDLE FINGER (Right)  SURGEON:  Surgeon(s) and Role:    * Leanora Cover, MD - Primary  PHYSICIAN ASSISTANT:   ASSISTANTS: none   ANESTHESIA:   Bier block with sedation  EBL:  1 mL   BLOOD ADMINISTERED:none  DRAINS: none   LOCAL MEDICATIONS USED:  MARCAINE     SPECIMEN:  No Specimen  DISPOSITION OF SPECIMEN:  N/A  COUNTS:  YES  TOURNIQUET:   Total Tourniquet Time Documented: Forearm (Right) - 22 minutes Total: Forearm (Right) - 22 minutes   DICTATION: .Note written in EPIC  [name] OF CARE: Discharge to home after PACU  PATIENT DISPOSITION:  PACU - hemodynamically stable.

## 2017-11-07 ENCOUNTER — Encounter (HOSPITAL_BASED_OUTPATIENT_CLINIC_OR_DEPARTMENT_OTHER): Payer: Self-pay | Admitting: Orthopedic Surgery

## 2017-11-13 ENCOUNTER — Other Ambulatory Visit: Payer: Self-pay | Admitting: Pharmacy Technician

## 2017-11-13 ENCOUNTER — Telehealth: Payer: Self-pay | Admitting: Internal Medicine

## 2017-11-14 ENCOUNTER — Other Ambulatory Visit: Payer: Self-pay

## 2017-11-14 MED ORDER — ELUXADOLINE 75 MG PO TABS: 37.5000 mg | ORAL_TABLET | ORAL | 1 refills | Status: AC

## 2017-11-14 NOTE — Telephone Encounter (Signed)
Faxed as requested

## 2017-11-14 NOTE — Patient Outreach (Signed)
[place] [facility]) Care Management  11/14/2017  Adrienne [name] [birth year] [id number]  Outreach call to the office of Dr. Hilarie Fredrickson to request a prescription for Viberzi to go along with the completed [employer] application for patient assistance. I have already received the provider and patient portion and noticed that for this particular medication that a hardcopy must be included.   Doreene Burke, [place] [phone]

## 2017-11-17 ENCOUNTER — Other Ambulatory Visit: Payer: Self-pay | Admitting: Pharmacy Technician

## 2017-11-17 MED ORDER — ELUXADOLINE 75 MG PO TABS: 75.0000 mg | ORAL_TABLET | Freq: Two times a day (BID) | ORAL | 3 refills | Status: DC

## 2017-11-17 NOTE — Patient Outreach (Signed)
[place] [facility]) Care Management  11/17/2017  Adrienne [name] [birth year] [id number]  Successful outgoing call to the office of Dr. Hilarie Fredrickson in reference to prescription received for Viberzi. I spoke with Lorriane Shire and requested to have a nurse to call me to verify prescription that I received. The patient is applying to [place] for patient assistance and a hardcopy is required for the medication. The prescription I received is written for 75 mg with directions 37.5 mg by mouth 1 day or 1 dose for 1 dose. This medication is not recommended to be cut in half and the directions need to bel clarified.  Doreene Burke, [place] [phone]

## 2017-11-17 NOTE — Telephone Encounter (Signed)
Crystal calling from [facility] states she is needing clarification on Viberzi possibly a new prescription faxed in. Best call back # [phone].

## 2017-11-17 NOTE — Telephone Encounter (Signed)
Corrected Viberzi script created and faxed to [place] at fax [phone] as well as to Doreene Burke with [facility] at fax [phone]

## 2017-11-17 NOTE — Addendum Note (Signed)
Addended by: Larina Bras on: 11/17/2017 03:51 PM   Modules accepted: Orders

## 2017-11-17 NOTE — Patient Outreach (Signed)
[place] [facility]) Care Management  11/17/2017  AVELEEN NEVERS 01/28/1951 [id number]  Received a voicemail from [place] in reference to the previous message that I left for her today. She verified that the prescription I received for Viberzi was incorrect and that she would resend another one. [employer] request that the application be faxed from the office of the provider so I requested her to fax the application, prescription, and documentation. I sent the fax to her and she is going to send me a copy of successful transmittal.  Doreene Burke, [place] [phone]

## 2017-11-18 DIAGNOSIS — I10 Essential (primary) hypertension: Secondary | ICD-10-CM | POA: Diagnosis not present

## 2017-11-18 DIAGNOSIS — R82998 Other abnormal findings in urine: Secondary | ICD-10-CM | POA: Diagnosis not present

## 2017-11-18 DIAGNOSIS — M859 Disorder of bone density and structure, unspecified: Secondary | ICD-10-CM | POA: Diagnosis not present

## 2017-11-18 DIAGNOSIS — E1129 Type 2 diabetes mellitus with other diabetic kidney complication: Secondary | ICD-10-CM | POA: Diagnosis not present

## 2017-11-18 DIAGNOSIS — E7800 Pure hypercholesterolemia, unspecified: Secondary | ICD-10-CM | POA: Diagnosis not present

## 2017-11-25 DIAGNOSIS — M19042 Primary osteoarthritis, left hand: Secondary | ICD-10-CM | POA: Diagnosis not present

## 2017-11-25 DIAGNOSIS — Z0000 Encounter for general adult medical examination without abnormal findings: Secondary | ICD-10-CM | POA: Diagnosis not present

## 2017-11-25 DIAGNOSIS — M859 Disorder of bone density and structure, unspecified: Secondary | ICD-10-CM | POA: Diagnosis not present

## 2017-11-25 DIAGNOSIS — E1129 Type 2 diabetes mellitus with other diabetic kidney complication: Secondary | ICD-10-CM | POA: Diagnosis not present

## 2017-11-25 DIAGNOSIS — Z794 Long term (current) use of insulin: Secondary | ICD-10-CM | POA: Diagnosis not present

## 2017-11-25 DIAGNOSIS — E7800 Pure hypercholesterolemia, unspecified: Secondary | ICD-10-CM | POA: Diagnosis not present

## 2017-11-25 DIAGNOSIS — D6489 Other specified anemias: Secondary | ICD-10-CM | POA: Diagnosis not present

## 2017-11-25 DIAGNOSIS — I10 Essential (primary) hypertension: Secondary | ICD-10-CM | POA: Diagnosis not present

## 2017-11-25 DIAGNOSIS — K589 Irritable bowel syndrome without diarrhea: Secondary | ICD-10-CM | POA: Diagnosis not present

## 2017-11-25 DIAGNOSIS — Z1389 Encounter for screening for other disorder: Secondary | ICD-10-CM | POA: Diagnosis not present

## 2017-11-25 DIAGNOSIS — Z6831 Body mass index (BMI) 31.0-31.9, adult: Secondary | ICD-10-CM | POA: Diagnosis not present

## 2017-11-25 DIAGNOSIS — M19041 Primary osteoarthritis, right hand: Secondary | ICD-10-CM | POA: Diagnosis not present

## 2017-11-28 ENCOUNTER — Other Ambulatory Visit: Payer: Self-pay | Admitting: Obstetrics and Gynecology

## 2017-11-28 DIAGNOSIS — Z139 Encounter for screening, unspecified: Secondary | ICD-10-CM

## 2017-12-15 ENCOUNTER — Other Ambulatory Visit: Payer: Self-pay | Admitting: Pharmacy Technician

## 2017-12-15 NOTE — Patient Outreach (Signed)
[facility] [place] [facility]) Care Management  12/15/2017  Adrienne [name] 1950/12/02 [id number]  Outgoing call to [place] patient assistance program in reference to an application submitted for Viberzi. Patient was denied due to copay not meeting requirements. A letter has been mailed to the patient and she may appeal the decision by providing [employer] with a printout from the pharmacy showing her out of pocket cost, a detailed monthly expense report, and a letter of medical necessity from her provider.  Unsuccessful patient outreach call to update the patient on the status of the application. A HIPPA compliant voicemail was left for her.  Doreene Burke, [place] [phone]

## 2017-12-16 ENCOUNTER — Encounter: Payer: Self-pay | Admitting: Pharmacist

## 2017-12-17 ENCOUNTER — Ambulatory Visit
Admission: RE | Admit: 2017-12-17 | Discharge: 2017-12-17 | Disposition: A | Discharge status: Home / Self Care | Payer: PPO | Source: Ambulatory Visit | Attending: Obstetrics and Gynecology | Admitting: Obstetrics and Gynecology

## 2017-12-17 DIAGNOSIS — Z139 Encounter for screening, unspecified: Secondary | ICD-10-CM

## 2017-12-29 ENCOUNTER — Ambulatory Visit
Admission: RE | Admit: 2017-12-29 | Discharge: 2017-12-29 | Disposition: A | Discharge status: Home / Self Care | Payer: PPO | Source: Ambulatory Visit | Attending: Obstetrics and Gynecology | Admitting: Obstetrics and Gynecology

## 2017-12-29 DIAGNOSIS — Z1231 Encounter for screening mammogram for malignant neoplasm of breast: Secondary | ICD-10-CM | POA: Diagnosis not present

## 2017-12-31 ENCOUNTER — Other Ambulatory Visit: Payer: Self-pay | Admitting: Obstetrics and Gynecology

## 2017-12-31 DIAGNOSIS — R928 Other abnormal and inconclusive findings on diagnostic imaging of breast: Secondary | ICD-10-CM

## 2018-01-05 ENCOUNTER — Ambulatory Visit
Admission: RE | Admit: 2018-01-05 | Discharge: 2018-01-05 | Disposition: A | Discharge status: Home / Self Care | Payer: PPO | Source: Ambulatory Visit | Attending: Obstetrics and Gynecology | Admitting: Obstetrics and Gynecology

## 2018-01-05 ENCOUNTER — Other Ambulatory Visit: Payer: Self-pay | Admitting: Obstetrics and Gynecology

## 2018-01-05 DIAGNOSIS — N6011 Diffuse cystic mastopathy of right breast: Secondary | ICD-10-CM | POA: Diagnosis not present

## 2018-01-05 DIAGNOSIS — R928 Other abnormal and inconclusive findings on diagnostic imaging of breast: Secondary | ICD-10-CM

## 2018-01-05 DIAGNOSIS — N6310 Unspecified lump in the right breast, unspecified quadrant: Secondary | ICD-10-CM

## 2018-01-06 DIAGNOSIS — D1801 Hemangioma of skin and subcutaneous tissue: Secondary | ICD-10-CM | POA: Diagnosis not present

## 2018-01-06 DIAGNOSIS — L821 Other seborrheic keratosis: Secondary | ICD-10-CM | POA: Diagnosis not present

## 2018-01-06 DIAGNOSIS — L308 Other specified dermatitis: Secondary | ICD-10-CM | POA: Diagnosis not present

## 2018-01-06 DIAGNOSIS — L814 Other melanin hyperpigmentation: Secondary | ICD-10-CM | POA: Diagnosis not present

## 2018-01-06 DIAGNOSIS — I788 Other diseases of capillaries: Secondary | ICD-10-CM | POA: Diagnosis not present

## 2018-01-06 DIAGNOSIS — C44319 Basal cell carcinoma of skin of other parts of face: Secondary | ICD-10-CM | POA: Diagnosis not present

## 2018-01-06 DIAGNOSIS — D485 Neoplasm of uncertain behavior of skin: Secondary | ICD-10-CM | POA: Diagnosis not present

## 2018-01-07 DIAGNOSIS — I10 Essential (primary) hypertension: Secondary | ICD-10-CM | POA: Diagnosis not present

## 2018-01-07 DIAGNOSIS — E1129 Type 2 diabetes mellitus with other diabetic kidney complication: Secondary | ICD-10-CM | POA: Diagnosis not present

## 2018-01-07 DIAGNOSIS — F3130 Bipolar disorder, current episode depressed, mild or moderate severity, unspecified: Secondary | ICD-10-CM | POA: Diagnosis not present

## 2018-01-22 DIAGNOSIS — F418 Other specified anxiety disorders: Secondary | ICD-10-CM | POA: Diagnosis not present

## 2018-01-27 DIAGNOSIS — Z85828 Personal history of other malignant neoplasm of skin: Secondary | ICD-10-CM | POA: Diagnosis not present

## 2018-01-27 DIAGNOSIS — C44319 Basal cell carcinoma of skin of other parts of face: Secondary | ICD-10-CM | POA: Diagnosis not present

## 2018-02-06 DIAGNOSIS — F3130 Bipolar disorder, current episode depressed, mild or moderate severity, unspecified: Secondary | ICD-10-CM | POA: Diagnosis not present

## 2018-02-25 DIAGNOSIS — E7800 Pure hypercholesterolemia, unspecified: Secondary | ICD-10-CM | POA: Diagnosis not present

## 2018-02-25 DIAGNOSIS — Z794 Long term (current) use of insulin: Secondary | ICD-10-CM | POA: Diagnosis not present

## 2018-02-25 DIAGNOSIS — I208 Other forms of angina pectoris: Secondary | ICD-10-CM | POA: Diagnosis not present

## 2018-02-25 DIAGNOSIS — K589 Irritable bowel syndrome without diarrhea: Secondary | ICD-10-CM | POA: Diagnosis not present

## 2018-02-25 DIAGNOSIS — E1129 Type 2 diabetes mellitus with other diabetic kidney complication: Secondary | ICD-10-CM | POA: Diagnosis not present

## 2018-02-25 DIAGNOSIS — E668 Other obesity: Secondary | ICD-10-CM | POA: Diagnosis not present

## 2018-02-25 DIAGNOSIS — D692 Other nonthrombocytopenic purpura: Secondary | ICD-10-CM | POA: Diagnosis not present

## 2018-02-25 DIAGNOSIS — I10 Essential (primary) hypertension: Secondary | ICD-10-CM | POA: Diagnosis not present

## 2018-02-25 DIAGNOSIS — F418 Other specified anxiety disorders: Secondary | ICD-10-CM | POA: Diagnosis not present

## 2018-02-25 DIAGNOSIS — Z6830 Body mass index (BMI) 30.0-30.9, adult: Secondary | ICD-10-CM | POA: Diagnosis not present

## 2018-02-25 DIAGNOSIS — M859 Disorder of bone density and structure, unspecified: Secondary | ICD-10-CM | POA: Diagnosis not present

## 2018-02-25 DIAGNOSIS — R808 Other proteinuria: Secondary | ICD-10-CM | POA: Diagnosis not present

## 2018-02-26 DIAGNOSIS — F3130 Bipolar disorder, current episode depressed, mild or moderate severity, unspecified: Secondary | ICD-10-CM | POA: Diagnosis not present

## 2018-03-17 DIAGNOSIS — F418 Other specified anxiety disorders: Secondary | ICD-10-CM | POA: Diagnosis not present

## 2018-03-17 DIAGNOSIS — F3130 Bipolar disorder, current episode depressed, mild or moderate severity, unspecified: Secondary | ICD-10-CM | POA: Diagnosis not present

## 2018-04-01 DIAGNOSIS — Z6831 Body mass index (BMI) 31.0-31.9, adult: Secondary | ICD-10-CM | POA: Diagnosis not present

## 2018-04-01 DIAGNOSIS — Z124 Encounter for screening for malignant neoplasm of cervix: Secondary | ICD-10-CM | POA: Diagnosis not present

## 2018-04-01 DIAGNOSIS — Z01419 Encounter for gynecological examination (general) (routine) without abnormal findings: Secondary | ICD-10-CM | POA: Diagnosis not present

## 2018-04-01 DIAGNOSIS — Z1389 Encounter for screening for other disorder: Secondary | ICD-10-CM | POA: Diagnosis not present

## 2018-04-07 DIAGNOSIS — E1129 Type 2 diabetes mellitus with other diabetic kidney complication: Secondary | ICD-10-CM | POA: Diagnosis not present

## 2018-04-07 DIAGNOSIS — Z794 Long term (current) use of insulin: Secondary | ICD-10-CM | POA: Diagnosis not present

## 2018-04-07 DIAGNOSIS — Z6831 Body mass index (BMI) 31.0-31.9, adult: Secondary | ICD-10-CM | POA: Diagnosis not present

## 2018-04-07 DIAGNOSIS — I10 Essential (primary) hypertension: Secondary | ICD-10-CM | POA: Diagnosis not present

## 2018-05-01 DIAGNOSIS — F3130 Bipolar disorder, current episode depressed, mild or moderate severity, unspecified: Secondary | ICD-10-CM | POA: Diagnosis not present

## 2018-05-26 DIAGNOSIS — R808 Other proteinuria: Secondary | ICD-10-CM | POA: Diagnosis not present

## 2018-05-26 DIAGNOSIS — Z23 Encounter for immunization: Secondary | ICD-10-CM | POA: Diagnosis not present

## 2018-05-26 DIAGNOSIS — E7800 Pure hypercholesterolemia, unspecified: Secondary | ICD-10-CM | POA: Diagnosis not present

## 2018-05-26 DIAGNOSIS — I208 Other forms of angina pectoris: Secondary | ICD-10-CM | POA: Diagnosis not present

## 2018-05-26 DIAGNOSIS — Z794 Long term (current) use of insulin: Secondary | ICD-10-CM | POA: Diagnosis not present

## 2018-05-26 DIAGNOSIS — Z6831 Body mass index (BMI) 31.0-31.9, adult: Secondary | ICD-10-CM | POA: Diagnosis not present

## 2018-05-26 DIAGNOSIS — I517 Cardiomegaly: Secondary | ICD-10-CM | POA: Diagnosis not present

## 2018-05-26 DIAGNOSIS — E668 Other obesity: Secondary | ICD-10-CM | POA: Diagnosis not present

## 2018-05-26 DIAGNOSIS — I10 Essential (primary) hypertension: Secondary | ICD-10-CM | POA: Diagnosis not present

## 2018-05-26 DIAGNOSIS — E1129 Type 2 diabetes mellitus with other diabetic kidney complication: Secondary | ICD-10-CM | POA: Diagnosis not present

## 2018-06-04 DIAGNOSIS — F3130 Bipolar disorder, current episode depressed, mild or moderate severity, unspecified: Secondary | ICD-10-CM | POA: Diagnosis not present

## 2018-07-07 ENCOUNTER — Ambulatory Visit
Admission: RE | Admit: 2018-07-07 | Discharge: 2018-07-07 | Disposition: A | Discharge status: Home / Self Care | Payer: PPO | Source: Ambulatory Visit | Attending: Obstetrics and Gynecology | Admitting: Obstetrics and Gynecology

## 2018-07-07 ENCOUNTER — Other Ambulatory Visit: Payer: Self-pay | Admitting: Obstetrics and Gynecology

## 2018-07-07 DIAGNOSIS — N6310 Unspecified lump in the right breast, unspecified quadrant: Secondary | ICD-10-CM

## 2018-07-07 DIAGNOSIS — Z794 Long term (current) use of insulin: Secondary | ICD-10-CM | POA: Diagnosis not present

## 2018-07-07 DIAGNOSIS — N6311 Unspecified lump in the right breast, upper outer quadrant: Secondary | ICD-10-CM | POA: Diagnosis not present

## 2018-07-07 DIAGNOSIS — Z23 Encounter for immunization: Secondary | ICD-10-CM | POA: Diagnosis not present

## 2018-07-07 DIAGNOSIS — E1129 Type 2 diabetes mellitus with other diabetic kidney complication: Secondary | ICD-10-CM | POA: Diagnosis not present

## 2018-07-07 DIAGNOSIS — N6312 Unspecified lump in the right breast, upper inner quadrant: Secondary | ICD-10-CM | POA: Diagnosis not present

## 2018-07-07 DIAGNOSIS — I10 Essential (primary) hypertension: Secondary | ICD-10-CM | POA: Diagnosis not present

## 2018-07-09 DIAGNOSIS — F3130 Bipolar disorder, current episode depressed, mild or moderate severity, unspecified: Secondary | ICD-10-CM | POA: Diagnosis not present

## 2018-07-21 ENCOUNTER — Ambulatory Visit: Payer: Self-pay | Admitting: Podiatry

## 2018-07-24 ENCOUNTER — Encounter: Payer: Self-pay | Admitting: Podiatry

## 2018-07-24 ENCOUNTER — Other Ambulatory Visit: Payer: Self-pay

## 2018-07-24 ENCOUNTER — Ambulatory Visit: Payer: PPO | Admitting: Podiatry

## 2018-07-24 VITALS — BP 96/46 | HR 79

## 2018-07-24 DIAGNOSIS — E119 Type 2 diabetes mellitus without complications: Secondary | ICD-10-CM | POA: Diagnosis not present

## 2018-07-24 DIAGNOSIS — L84 Corns and callosities: Secondary | ICD-10-CM

## 2018-07-24 NOTE — Patient Instructions (Signed)
PURCHASE SHOCK ABSORBING SHOE GEAR EVERY 6 MONTHS IF YOU WEAR THEM DAILY  SKECHER AVAILABLE AT SKECHERS OUTLET IN [facility] OR AT [facility]  Corns and Calluses Corns are small areas of thickened skin that occur on the top, sides, or tip of a toe. They contain a cone-shaped core with a point that can press on a nerve below. This causes pain. Calluses are areas of thickened skin that can occur anywhere on the body including hands, fingers, palms, soles of the feet, and heels.Calluses are usually larger than corns. What are the causes? Corns and calluses are caused by rubbing (friction) or pressure, such as from shoes that are too tight or do not fit properly. What increases the risk? Corns are more likely to develop in people who have toe deformities, such as hammer toes. Since calluses can occur with friction to any area of the skin, calluses are more likely to develop in people who:  Work with their hands.  Wear shoes that fit poorly, shoes that are too tight, or shoes that are high-heeled.  Have toes deformities.  What are the signs or symptoms? Symptoms of a corn or callus include:  A hard growth on the skin.  Pain or tenderness under the skin.  Redness and swelling.  Increased discomfort while wearing tight-fitting shoes.  How is this diagnosed? Corns and calluses may be diagnosed with a medical history and physical exam. How is this treated? Corns and calluses may be treated with:  Removing the cause of the friction or pressure. This may include: ? Changing your shoes. ? Wearing shoe inserts (orthotics) or other protective layers in your shoes, such as a corn pad. ? Wearing gloves.  Medicines to help soften skin in the hardened, thickened areas.  Reducing the size of the corn or callus by removing the dead layers of skin.  Antibiotic medicines to treat infection.  Surgery, if a toe deformity is the cause.  Follow these instructions at home:  Take medicines only as  directed by your health care provider.  If you were prescribed an antibiotic, finish all of it even if you start to feel better.  Wear shoes that fit well. Avoid wearing high-heeled shoes and shoes that are too tight or too loose.  Wear any padding, protective layers, gloves, or orthotics as directed by your health care provider.  Soak your hands or feet and then use a file or pumice stone to soften your corn or callus. Do this as directed by your health care provider.  Check your corn or callus every day for signs of infection. Watch for: ? Redness, swelling, or pain. ? Fluid, blood, or pus. Contact a health care provider if:  Your symptoms do not improve with treatment.  You have increased redness, swelling, or pain at the site of your corn or callus.  You have fluid, blood, or pus coming from your corn or callus.  You have new symptoms. This information is not intended to replace advice given to you by your health care provider. Make sure you discuss any questions you have with your health care provider. Document Released: 06/22/2004 Document Revised: 04/05/2016 Document Reviewed: 09/12/2014 Elsevier Interactive Patient Education  2018 [employer]. Diabetes and Foot Care Diabetes may cause you to have problems because of poor blood supply (circulation) to your feet and legs. This may cause the skin on your feet to become thinner, break easier, and heal more slowly. Your skin may become dry, and the skin may peel and crack.  You may also have nerve damage in your legs and feet causing decreased feeling in them. You may not notice minor injuries to your feet that could lead to infections or more serious problems. Taking care of your feet is one of the most important things you can do for yourself. Follow these instructions at home:  Wear shoes at all times, even in the house. Do not go barefoot. Bare feet are easily injured.  Check your feet daily for blisters, cuts, and redness. If  you cannot see the bottom of your feet, use a mirror or ask someone for help.  Wash your feet with warm water (do not use hot water) and mild soap. Then pat your feet and the areas between your toes until they are completely dry. Do not soak your feet as this can dry your skin.  Apply a moisturizing lotion or petroleum jelly (that does not contain alcohol and is unscented) to the skin on your feet and to dry, brittle toenails. Do not apply lotion between your toes.  Trim your toenails straight across. Do not dig under them or around the cuticle. File the edges of your nails with an emery board or nail file.  Do not cut corns or calluses or try to remove them with medicine.  Wear clean socks or stockings every day. Make sure they are not too tight. Do not wear knee-high stockings since they may decrease blood flow to your legs.  Wear shoes that fit properly and have enough cushioning. To break in new shoes, wear them for just a few hours a day. This prevents you from injuring your feet. Always look in your shoes before you put them on to be sure there are no objects inside.  Do not cross your legs. This may decrease the blood flow to your feet.  If you find a minor scrape, cut, or break in the skin on your feet, keep it and the skin around it clean and dry. These areas may be cleansed with mild soap and water. Do not cleanse the area with peroxide, alcohol, or iodine.  When you remove an adhesive bandage, be sure not to damage the skin around it.  If you have a wound, look at it several times a day to make sure it is healing.  Do not use heating pads or hot water bottles. They may burn your skin. If you have lost feeling in your feet or legs, you may not know it is happening until it is too late.  Make sure your health care provider performs a complete foot exam at least annually or more often if you have foot problems. Report any cuts, sores, or bruises to your health care provider  immediately. Contact a health care provider if:  You have an injury that is not healing.  You have cuts or breaks in the skin.  You have an ingrown nail.  You notice redness on your legs or feet.  You feel burning or tingling in your legs or feet.  You have pain or cramps in your legs and feet.  Your legs or feet are numb.  Your feet always feel cold. Get help right away if:  There is increasing redness, swelling, or pain in or around a wound.  There is a red line that goes up your leg.  Pus is coming from a wound.  You develop a fever or as directed by your health care provider.  You notice a bad smell coming from an ulcer  or wound. This information is not intended to replace advice given to you by your health care provider. Make sure you discuss any questions you have with your health care provider. Document Released: 09/13/2000 Document Revised: 02/22/2016 Document Reviewed: 02/23/2013 Elsevier Interactive Patient Education  2017 [employer].

## 2018-08-15 NOTE — Progress Notes (Signed)
Subjective: Adrienne [name] referred by Dr. Domenick Gong and presents today for diabetic foot examination. She has h/o diabetes and cc of painful callus of the right heel that has been present for 1-1.5 months. Pain is aggrvated when weightbearing. She denies any history of wounds.   Medical History   09/2017 Trigger finger of right hand   Date Unknown Arthritis   Date Unknown Dental crowns present  Date Unknown Depression  Date Unknown Difficulty swallowing solids   Date Unknown Eczema  Date Unknown Esophageal reflux  Date Unknown Esophageal stricture  Date Unknown Family history of adverse reaction to anesthesia   Date Unknown Generalized anxiety disorder  Date Unknown Hyperlipidemia  Date Unknown Hypertension   Date Unknown Insulin dependent diabetes mellitus ([place])  Date Unknown Irritable bowel syndrome  Date Unknown Nutcracker esophagus  Date Unknown Seasonal allergies  Date Unknown Wears hearing aid in both ears   Medical History   09/2017 Trigger finger of right hand   Date Unknown Arthritis   Date Unknown Dental crowns present  Date Unknown Depression  Date Unknown Difficulty swallowing solids   Date Unknown Eczema  Date Unknown Esophageal reflux  Date Unknown Esophageal stricture  Date Unknown Family history of adverse reaction to anesthesia   Date Unknown Generalized anxiety disorder  Date Unknown Hyperlipidemia  Date Unknown Hypertension   Date Unknown Insulin dependent diabetes mellitus ([place])  Date Unknown Irritable bowel syndrome  Date Unknown Nutcracker esophagus  Date Unknown Seasonal allergies  Date Unknown Wears hearing aid in both ears   Surgical History   11/06/2017 Trigger finger release (Right)   10/30/2016 Upper gastrointestinal endoscopy  01/07/2014 Carpal tunnel release (Left)   10/18/2013 Upper gastrointestinal endoscopy   05/28/2006 Esophageal manometry  Date Unknown Colonoscopy  Date Unknown Eye surgery (Right)   Date Unknown Nasal sinus  surgery  Date Unknown Tonsillectomy   Surgical History   11/06/2017 Trigger finger release (Right)   10/30/2016 Upper gastrointestinal endoscopy  01/07/2014 Carpal tunnel release (Left)   10/18/2013 Upper gastrointestinal endoscopy   05/28/2006 Esophageal manometry  Date Unknown Colonoscopy  Date Unknown Eye surgery (Right)   Date Unknown Nasal sinus surgery  Date Unknown Tonsillectomy   Allergies     Sulfa AntibioticsRash    Tobacco History Collapse by Default  Collapse by Default     Smoking Status  Never Smoker  Smokeless Tobacco Status  Never Used   Family History   Maternal Grandmother Colon cancer         Maternal Grandfather Stomach cancer         Paternal Grandfather Prostate cancer         Sister Diabetes         Father Diabetes    CAD (76 y)   AAA (abdominal aortic aneurysm)         Mother Peripheral vascular disease (59 y)   Anesthesia problems          Grandchild Inflammatory bowel disease         Brother AAA (abdominal aortic aneurysm)    Review of systems: Constitutional: Denies chills fatigue fever sweats weight change Eyes: Denies diplopia glare light sensitivity Ears nose mouth throat: +Wears hearing aids, Denies vertigo denies bloody nose rhinitis denies cold sores and snoring Cardiovascular: Denies chest pain tightness Respiratory: Denies difficulty breathing, denies congestion Gastrointestinal: Denies abdominal pain, diarrhea, nausea, vomiting Genitourinary: Denies nocturia, pain on urination, blood in urine Musculoskeletal: Denies cramping Skin: changes in toenails, denies color change dryness, itchy skin, mole  changes, or rash  Neurological: Denies fainting, denies seizure, denies change in speech.  Positive for headaches periodically Endocrine: Denies dry mouth, denies flushing, denies heat intolerance, denies cold intolerance, denies excessive thirst, denies polyuria, denies nocturia Hematological: Denies easy bleeding, excessive  bleeding, easy bruising, denies enlarged lymph nodes Allergy/immunological: Denies hives denies frequent infections   Objective: Vitals:   07/24/18 1048  BP: (!) 96/46  Pulse: 79   Vascular Examination: Capillary refill time immediate x 10 digits Dorsalis pedis and posterior tibial pulses present b/l Sparse digital hair x 10 digits Skin temperature warm to warm b/l  Dermatological Examination: Skin with normal turgor, texture and tone Toenails show evidence of recent trimming b/l Hyperkeratotic lesion plantarcentral heel pad b/l with tenderness to palpation. No flocculence, no edema, no erythema.  Musculoskeletal: Muscle strength 5/5 to all LE muscle groups  Neurological: Sensation intact with 10 gram monofilament. Vibratory sensation intact.  Assessment: Painful callus plantarcentral heelpad b/l feet  [name]: 1. Discussed examination and treatment [name] on today. Dispensed literature on diabetic foot care and calluses. 2. Calluses debrided and smooth with burr b/l heelpads. Dispensed Silipos gel heel pads to for placement in shoes b/l. Patient noted relief post treatment on today. 3. Patient to continue soft, supportive shoe gear 4. Patient to report any pedal injuries to medical professional immediately. 5. Follow up 3 months. Patient/POA to call should there be a concern in the interim.

## 2018-09-07 DIAGNOSIS — F3130 Bipolar disorder, current episode depressed, mild or moderate severity, unspecified: Secondary | ICD-10-CM | POA: Diagnosis not present

## 2018-10-02 DIAGNOSIS — Z85828 Personal history of other malignant neoplasm of skin: Secondary | ICD-10-CM | POA: Diagnosis not present

## 2018-10-02 DIAGNOSIS — L820 Inflamed seborrheic keratosis: Secondary | ICD-10-CM | POA: Diagnosis not present

## 2018-10-28 ENCOUNTER — Ambulatory Visit (INDEPENDENT_AMBULATORY_CARE_PROVIDER_SITE_OTHER): Payer: PPO

## 2018-10-28 ENCOUNTER — Ambulatory Visit: Payer: PPO | Admitting: Podiatry

## 2018-10-28 ENCOUNTER — Encounter: Payer: Self-pay | Admitting: Podiatry

## 2018-10-28 DIAGNOSIS — M722 Plantar fascial fibromatosis: Secondary | ICD-10-CM

## 2018-10-28 MED ORDER — DEXAMETHASONE SODIUM PHOSPHATE 4 MG/ML IJ SOLN: 2.0000 mg | Freq: Once | INTRAMUSCULAR | Status: AC

## 2018-10-28 NOTE — Patient Instructions (Signed)
Diabetes Mellitus and Foot Care Foot care is an important part of your health, especially when you have diabetes. Diabetes may cause you to have problems because of poor blood flow (circulation) to your feet and legs, which can cause your skin to:  Become thinner and drier.  Break more easily.  Heal more slowly.  Peel and crack. You may also have nerve damage (neuropathy) in your legs and feet, causing decreased feeling in them. This means that you may not notice minor injuries to your feet that could lead to more serious problems. Noticing and addressing any potential problems early is the best way to prevent future foot problems. How to care for your feet Foot hygiene  Wash your feet daily with warm water and mild soap. Do not use hot water. Then, pat your feet and the areas between your toes until they are completely dry. Do not soak your feet as this can dry your skin.  Trim your toenails straight across. Do not dig under them or around the cuticle. File the edges of your nails with an emery board or nail file.  Apply a moisturizing lotion or petroleum jelly to the skin on your feet and to dry, brittle toenails. Use lotion that does not contain alcohol and is unscented. Do not apply lotion between your toes. Shoes and socks  Wear clean socks or stockings every day. Make sure they are not too tight. Do not wear knee-high stockings since they may decrease blood flow to your legs.  Wear shoes that fit properly and have enough cushioning. Always look in your shoes before you put them on to be sure there are no objects inside.  To break in new shoes, wear them for just a few hours a day. This prevents injuries on your feet. Wounds, scrapes, corns, and calluses  Check your feet daily for blisters, cuts, bruises, sores, and redness. If you cannot see the bottom of your feet, use a mirror or ask someone for help.  Do not cut corns or calluses or try to remove them with medicine.  If you  find a minor scrape, cut, or break in the skin on your feet, keep it and the skin around it clean and dry. You may clean these areas with mild soap and water. Do not clean the area with peroxide, alcohol, or iodine.  If you have a wound, scrape, corn, or callus on your foot, look at it several times a day to make sure it is healing and not infected. Check for: ? Redness, swelling, or pain. ? Fluid or blood. ? Warmth. ? Pus or a bad smell. General instructions  Do not cross your legs. This may decrease blood flow to your feet.  Do not use heating pads or hot water bottles on your feet. They may burn your skin. If you have lost feeling in your feet or legs, you may not know this is happening until it is too late.  Protect your feet from hot and cold by wearing shoes, such as at the beach or on hot pavement.  Schedule a complete foot exam at least once a year (annually) or more often if you have foot problems. If you have foot problems, report any cuts, sores, or bruises to your health care provider immediately. Contact a health care provider if:  You have a medical condition that increases your risk of infection and you have any cuts, sores, or bruises on your feet.  You have an injury that is not   healing.  You have redness on your legs or feet.  You feel burning or tingling in your legs or feet.  You have pain or cramps in your legs and feet.  Your legs or feet are numb.  Your feet always feel cold.  You have pain around a toenail. Get help right away if:  You have a wound, scrape, corn, or callus on your foot and: ? You have pain, swelling, or redness that gets worse. ? You have fluid or blood coming from the wound, scrape, corn, or callus. ? Your wound, scrape, corn, or callus feels warm to the touch. ? You have pus or a bad smell coming from the wound, scrape, corn, or callus. ? You have a fever. ? You have a red line going up your leg. Summary  Check your feet every day  for cuts, sores, red spots, swelling, and blisters.  Moisturize feet and legs daily.  Wear shoes that fit properly and have enough cushioning.  If you have foot problems, report any cuts, sores, or bruises to your health care provider immediately.  Schedule a complete foot exam at least once a year (annually) or more often if you have foot problems. This information is not intended to replace advice given to you by your health care provider. Make sure you discuss any questions you have with your health care provider. Document Released: 09/13/2000 Document Revised: 10/29/2017 Document Reviewed: 10/18/2016 Elsevier Interactive Patient Education  2019 [employer]  Corns and Calluses Corns are small areas of thickened skin that occur on the top, sides, or tip of a toe. They contain a cone-shaped core with a point that can press on a nerve below. This causes pain.  Calluses are areas of thickened skin that can occur anywhere on the body, including the hands, fingers, palms, soles of the feet, and heels. Calluses are usually larger than corns. What are the causes? Corns and calluses are caused by rubbing (friction) or pressure, such as from shoes that are too tight or do not fit properly. What increases the risk? Corns are more likely to develop in people who have misshapen toes (toe deformities), such as hammer toes. Calluses can occur with friction to any area of the skin. They are more likely to develop in people who:  Work with their hands.  Wear shoes that fit poorly, are too tight, or are high-heeled.  Have toe deformities. What are the signs or symptoms? Symptoms of a corn or callus include:  A hard growth on the skin.  Pain or tenderness under the skin.  Redness and swelling.  Increased discomfort while wearing tight-fitting shoes, if your feet are affected. If a corn or callus becomes infected, symptoms may include:  Redness and swelling that gets  worse.  Pain.  Fluid, blood, or pus draining from the corn or callus. How is this diagnosed? Corns and calluses may be diagnosed based on your symptoms, your medical history, and a physical exam. How is this treated? Treatment for corns and calluses may include:  Removing the cause of the friction or pressure. This may involve: ? Changing your shoes. ? Wearing shoe inserts (orthotics) or other protective layers in your shoes, such as a corn pad. ? Wearing gloves.  Applying medicine to the skin (topical medicine) to help soften skin in the hardened, thickened areas.  Removing layers of dead skin with a file to reduce the size of the corn or callus.  Removing the corn or callus with a scalpel   or laser.  Taking antibiotic medicines, if your corn or callus is infected.  Having surgery, if a toe deformity is the cause. Follow these instructions at home:   Take over-the-counter and prescription medicines only as told by your health care provider.  If you were prescribed an antibiotic, take it as told by your health care provider. Do not stop taking it even if your condition starts to improve.  Wear shoes that fit well. Avoid wearing high-heeled shoes and shoes that are too tight or too loose.  Wear any padding, protective layers, gloves, or orthotics as told by your health care provider.  Soak your hands or feet and then use a file or pumice stone to soften your corn or callus. Do this as told by your health care provider.  Check your corn or callus every day for symptoms of infection. Contact a health care provider if you:  Notice that your symptoms do not improve with treatment.  Have redness or swelling that gets worse.  Notice that your corn or callus becomes painful.  Have fluid, blood, or pus coming from your corn or callus.  Have new symptoms. Summary  Corns are small areas of thickened skin that occur on the top, sides, or tip of a toe.  Calluses are areas of  thickened skin that can occur anywhere on the body, including the hands, fingers, palms, and soles of the feet. Calluses are usually larger than corns.  Corns and calluses are caused by rubbing (friction) or pressure, such as from shoes that are too tight or do not fit properly.  Treatment may include wearing any padding, protective layers, gloves, or orthotics as told by your health care provider. This information is not intended to replace advice given to you by your health care provider. Make sure you discuss any questions you have with your health care provider. Document Released: 06/22/2004 Document Revised: 07/30/2017 Document Reviewed: 07/30/2017 Elsevier Interactive Patient Education  2019 [employer]  

## 2018-10-28 NOTE — Progress Notes (Signed)
SUBJECTIVE: Adrienne [name] presents to clinic on today with cc of painful right  Heel.  Patient admits pain with first step in the morning and after periods of rest. Duration of pain is greater than 2 months Prior treatments include gel heel cushions with no improvement.  Past Medical History:  Diagnosis Date  . Arthritis    hands/fingers  . Dental crowns present   . Depression   . Difficulty swallowing solids    due to esophageal stricture  . Eczema   . Esophageal reflux   . Esophageal stricture   . Family history of adverse reaction to anesthesia    pt's mother had hx. of N/V and being hard to wake up post-op  . Generalized anxiety disorder   . Hyperlipidemia   . Hypertension    states under control with med., has been on med. x 30 yr.  . Insulin dependent diabetes mellitus ([place])   . Irritable bowel syndrome   . Nutcracker esophagus   . Seasonal allergies   . Trigger finger of right hand 09/2017   index and middle fingers  . Wears hearing aid in both ears      Patient Active Problem List   Diagnosis Date Noted  . Bilateral hearing loss 06/24/2016  . Rhinitis, chronic 06/24/2016  . Internal hemorrhoids 02/05/2013  . IBS (irritable bowel syndrome) 12/05/2011  . Anxiety disorder 12/05/2011  . GERD (gastroesophageal reflux disease) 12/05/2011  . IBS 08/16/2010  . DIAB W/UNS COMP TYPE II/UNS NOT STATED UNCNTRL 12/02/2008  . GENERALIZED ANXIETY DISORDER 12/02/2008  . GERD 12/02/2008  . DYSPHAGIA 12/02/2008  . HYPERLIPIDEMIA 01/22/2008  . IRRITABLE BOWEL SYNDROME, HX OF 01/22/2008  . ULCER OF ESOPHAGUS WITHOUT BLEEDING 05/12/2006  . GASTRITIS 05/12/2006  . COLITIS 12/14/2003     Past Surgical History:  Procedure Laterality Date  . CARPAL TUNNEL RELEASE Left 01/07/2014   Procedure: LEFT CARPAL TUNNEL RELEASE;  Surgeon: Tennis Must, MD;  Location: [facility];  Service: Orthopedics;  Laterality: Left;  . COLONOSCOPY    . ESOPHAGEAL MANOMETRY  05/28/2006   . EYE SURGERY Right    tear duct repair  . NASAL SINUS SURGERY    . TONSILLECTOMY    . TRIGGER FINGER RELEASE Right 11/06/2017   Procedure: RELEASE TRIGGER FINGER/A-1 PULLEY RIGHT INDEX FINGER AND RIGHT MIDDLE FINGER;  Surgeon: Leanora Cover, MD;  Location: [facility];  Service: Orthopedics;  Laterality: Right;  . UPPER GASTROINTESTINAL ENDOSCOPY  10/18/2013   with dilation; with Propofol  . UPPER GASTROINTESTINAL ENDOSCOPY  10/30/2016      Current Outpatient Medications:  .  ALPRAZolam (XANAX) 0.25 MG tablet, TAKE 1 TABLET BY MOUTH ONCE DAILY AS NEEDED FOR ANXIETY OR SLEEP, Disp: , Rfl: 0 .  amoxicillin-clavulanate (AUGMENTIN) 875-125 MG tablet, amoxicillin 875 mg-potassium clavulanate 125 mg tablet, Disp: , Rfl:  .  aspirin 81 MG tablet, Take 81 mg by mouth daily., Disp: , Rfl:  .  Aspirin-Calcium Carbonate 81-777 MG TABS, Take by mouth., Disp: , Rfl:  .  azelastine (ASTELIN) 0.1 % nasal spray, azelastine 137 mcg (0.1 %) nasal spray aerosol, Disp: , Rfl:  .  benzonatate (TESSALON) 100 MG capsule, benzonatate 100 mg capsule, Disp: , Rfl:  .  calcium carbonate (OS-CAL) 600 MG TABS tablet, Take 600 mg by mouth 2 (two) times daily with a meal., Disp: , Rfl:  .  Cetirizine HCl 10 MG CAPS, Take by mouth., Disp: , Rfl:  .  clobetasol cream (TEMOVATE) 0.05 %, ,  Disp: , Rfl:  .  clobetasol ointment (TEMOVATE) 0.05 %, clobetasol 0.05 % topical ointment, Disp: , Rfl:  .  clonazePAM (KLONOPIN) 0.5 MG tablet, clonazepam 0.5 mg tablet, Disp: , Rfl:  .  Cyanocobalamin (RA VITAMIN B-12 TR) 1000 MCG TBCR, Take by mouth., Disp: , Rfl:  .  doxycycline (MONODOX) 100 MG capsule, doxycycline monohydrate 100 mg capsule, Disp: , Rfl:  .  Dulaglutide (TRULICITY) 1.5 TD/3.2KG SOPN, Inject 1.5 mg into the skin once a week. WEDNESDAY, Disp: , Rfl:  .  Eluxadoline (VIBERZI) 75 MG TABS, Take 75 mg by mouth 2 (two) times daily., Disp: 180 tablet, Rfl: 3 .  empagliflozin (JARDIANCE) 25 MG TABS tablet,  Take 25 mg by mouth daily., Disp: , Rfl:  .  esomeprazole (NEXIUM) 40 MG capsule, Take 40 mg by mouth daily. , Disp: , Rfl:  .  Fe Fum-FePoly-FA-Vit C-Vit B3 (INTEGRA F) 125-1 MG CAPS, Take by mouth., Disp: , Rfl:  .  fluocinonide cream (LIDEX) 0.05 %, fluocinonide 0.05 % topical cream, Disp: , Rfl:  .  fluticasone (FLONASE) 50 MCG/ACT nasal spray, Place into the nose., Disp: , Rfl:  .  furosemide (LASIX) 20 MG tablet, furosemide 20 mg tablet, Disp: , Rfl:  .  HYDROcodone-acetaminophen (NORCO) 5-325 MG tablet, 1-2 tabs po q6 hours prn pain, Disp: 20 tablet, Rfl: 0 .  HYDROcodone-homatropine (HYCODAN) 5-1.5 MG/5ML syrup, hydrocodone-homatropine 5 mg-1.5 mg/5 mL oral syrup, Disp: , Rfl:  .  hydrocortisone (ANUSOL-HC) 25 MG suppository, hydrocortisone acetate 25 mg rectal suppository, Disp: , Rfl:  .  imipramine (TOFRANIL) 25 MG tablet, Take by mouth., Disp: , Rfl:  .  insulin glargine (LANTUS) 100 UNIT/ML injection, Inject 40 Units into the skin daily. , Disp: , Rfl:  .  isosorbide mononitrate (IMDUR) 30 MG 24 hr tablet, Take 15 mg by mouth daily., Disp: , Rfl:  .  metFORMIN (GLUCOPHAGE) 1000 MG tablet, Take 1,000 mg by mouth 2 (two) times daily with a meal. , Disp: , Rfl:  .  Multiple Vitamin (MULTIVITAMIN) tablet, Take 1 tablet by mouth daily., Disp: , Rfl:  .  olanzapine-FLUoxetine (SYMBYAX) 6-25 MG per capsule, Take 1 capsule by mouth every evening., Disp: , Rfl:  .  ramipril (ALTACE) 10 MG tablet, Take 10 mg by mouth daily., Disp: , Rfl:  .  simvastatin (ZOCOR) 40 MG tablet, Take 40 mg by mouth every evening., Disp: , Rfl:    Allergies  Allergen Reactions  . Sulfa Antibiotics Rash     OBJECTIVE: There were no vitals filed for this visit.   Vascular Examination: Capillary refill time immediate x 10 digits Dorsalis pedis and posterior tibial pulses present b/l Sparse digital hair x 10 digits Skin temperature warm to warm b/l  Dermatological Examination: Skin with normal turgor,  texture and tone  Toenails well manicured and polished.  Neurological: Sensation intact with 10 gram monofilament. Vibratory sensation intact.  Musculoskeletal: Muscle strength 5/5 to all LE muscle groups b/l Negative Tinel's sign b/l Pain on palpation noted medial tubercle right  heel  Xray findings: Show evidence of posterior calcaneal heel spur.  ASSESSMENT: 1. Plantar fasciitis right foot   PLAN: 1. Patient examined today. 2. Xrays taken right foot and discussed findings with patient. 3. Options for plantar fasciitis discussed included stretching exercises, night splint, plantar fascial strap, oral anti-inflammatories, icing, protection, local steroid injection, and arch  Discussed etiology, pathology, conservative vs. surgical therapies. At this time a plantar fascial injection was recommended.  The patient agreed and  a sterile skin prep was applied.  An injection consisting of total 1cc of 50/50 mixture dexamethasone and marcaine mixture was infiltrated at the point of maximal tenderness on the right Heel.  The patient tolerated this well and was given instructions for aftercare.  Plantar fascial brace dispensed for right foot. 4. Prescripton sent to patient's pharmacy for 5. Dispensed literature on stretching exercises. 5.   Follow up 1 month.

## 2018-11-04 ENCOUNTER — Telehealth: Payer: Self-pay | Admitting: *Deleted

## 2018-11-04 NOTE — Telephone Encounter (Signed)
I did not receive a call from pt.

## 2018-11-04 NOTE — Telephone Encounter (Signed)
-----   Message from Marzetta Board, DPM sent at 10/28/2018  5:34 PM EST ----- Regarding: NSAIDs Hello All!  I will be out of the office until Monday and I won't be seeing patients until Tuesday.  I called Ms. Lynden Ang twice today and left her a message. Just wanted you to know in case she calls back.  I did not call anti-inflammatories in for her. I would just like her to do her stretching exercises, ice, and plantar fascial brace.   Thanks!  Dr. Darnell Level.

## 2018-11-09 DIAGNOSIS — F418 Other specified anxiety disorders: Secondary | ICD-10-CM | POA: Diagnosis not present

## 2018-11-19 DIAGNOSIS — M859 Disorder of bone density and structure, unspecified: Secondary | ICD-10-CM | POA: Diagnosis not present

## 2018-11-19 DIAGNOSIS — R82998 Other abnormal findings in urine: Secondary | ICD-10-CM | POA: Diagnosis not present

## 2018-11-19 DIAGNOSIS — E1129 Type 2 diabetes mellitus with other diabetic kidney complication: Secondary | ICD-10-CM | POA: Diagnosis not present

## 2018-11-19 DIAGNOSIS — E7800 Pure hypercholesterolemia, unspecified: Secondary | ICD-10-CM | POA: Diagnosis not present

## 2018-11-26 DIAGNOSIS — Z1331 Encounter for screening for depression: Secondary | ICD-10-CM | POA: Diagnosis not present

## 2018-11-26 DIAGNOSIS — D692 Other nonthrombocytopenic purpura: Secondary | ICD-10-CM | POA: Diagnosis not present

## 2018-11-26 DIAGNOSIS — Z1339 Encounter for screening examination for other mental health and behavioral disorders: Secondary | ICD-10-CM | POA: Diagnosis not present

## 2018-11-26 DIAGNOSIS — E1129 Type 2 diabetes mellitus with other diabetic kidney complication: Secondary | ICD-10-CM | POA: Diagnosis not present

## 2018-11-26 DIAGNOSIS — F3130 Bipolar disorder, current episode depressed, mild or moderate severity, unspecified: Secondary | ICD-10-CM | POA: Diagnosis not present

## 2018-11-26 DIAGNOSIS — Z794 Long term (current) use of insulin: Secondary | ICD-10-CM | POA: Diagnosis not present

## 2018-11-26 DIAGNOSIS — E7800 Pure hypercholesterolemia, unspecified: Secondary | ICD-10-CM | POA: Diagnosis not present

## 2018-11-26 DIAGNOSIS — Z23 Encounter for immunization: Secondary | ICD-10-CM | POA: Diagnosis not present

## 2018-11-26 DIAGNOSIS — I10 Essential (primary) hypertension: Secondary | ICD-10-CM | POA: Diagnosis not present

## 2018-11-26 DIAGNOSIS — M859 Disorder of bone density and structure, unspecified: Secondary | ICD-10-CM | POA: Diagnosis not present

## 2018-11-26 DIAGNOSIS — K589 Irritable bowel syndrome without diarrhea: Secondary | ICD-10-CM | POA: Diagnosis not present

## 2018-11-26 DIAGNOSIS — I208 Other forms of angina pectoris: Secondary | ICD-10-CM | POA: Diagnosis not present

## 2018-11-26 DIAGNOSIS — Z0000 Encounter for general adult medical examination without abnormal findings: Secondary | ICD-10-CM | POA: Diagnosis not present

## 2019-01-06 ENCOUNTER — Other Ambulatory Visit: Payer: PPO

## 2019-01-07 DIAGNOSIS — I10 Essential (primary) hypertension: Secondary | ICD-10-CM | POA: Diagnosis not present

## 2019-01-07 DIAGNOSIS — E1129 Type 2 diabetes mellitus with other diabetic kidney complication: Secondary | ICD-10-CM | POA: Diagnosis not present

## 2019-01-11 ENCOUNTER — Other Ambulatory Visit: Payer: PPO

## 2019-01-27 ENCOUNTER — Other Ambulatory Visit: Payer: Self-pay

## 2019-01-27 ENCOUNTER — Ambulatory Visit: Payer: PPO | Admitting: Podiatry

## 2019-01-27 ENCOUNTER — Encounter: Payer: Self-pay | Admitting: Podiatry

## 2019-01-27 VITALS — Temp 98.2°F

## 2019-01-27 DIAGNOSIS — M79604 Pain in right leg: Secondary | ICD-10-CM

## 2019-01-27 DIAGNOSIS — M722 Plantar fascial fibromatosis: Secondary | ICD-10-CM

## 2019-01-27 NOTE — Patient Instructions (Signed)
Plantar Fasciitis  Plantar fasciitis is a painful foot condition that affects the heel. It occurs when the band of tissue that connects the toes to the heel bone (plantar fascia) becomes irritated. This can happen as the result of exercising too much or doing other repetitive activities (overuse injury). The pain from plantar fasciitis can range from mild irritation to severe pain that makes it difficult to walk or move. The pain is usually worse in the morning after sleeping, or after sitting or lying down for a while. Pain may also be worse after long periods of walking or standing. What are the causes? This condition may be caused by:  Standing for long periods of time.  Wearing shoes that do not have good arch support.  Doing activities that put stress on joints (high-impact activities), including running, aerobics, and ballet.  Being overweight.  An abnormal way of walking (gait).  Tight muscles in the back of your lower leg (calf).  High arches in your feet.  Starting a new athletic activity. What are the signs or symptoms? The main symptom of this condition is heel pain. Pain may:  Be worse with first steps after a time of rest, especially in the morning after sleeping or after you have been sitting or lying down for a while.  Be worse after long periods of standing still.  Decrease after 30-45 minutes of activity, such as gentle walking. How is this diagnosed? This condition may be diagnosed based on your medical history and your symptoms. Your health care provider may ask questions about your activity level. Your health care provider will do a physical exam to check for:  A tender area on the bottom of your foot.  A high arch in your foot.  Pain when you move your foot.  Difficulty moving your foot. You may have imaging tests to confirm the diagnosis, such as:  X-rays.  Ultrasound.  MRI. How is this treated? Treatment for plantar fasciitis depends on how  severe your condition is. Treatment may include:  Rest, ice, applying pressure (compression), and raising the affected foot (elevation). This may be called RICE therapy. Your health care provider may recommend RICE therapy along with over-the-counter pain medicines to manage your pain.  Exercises to stretch your calves and your plantar fascia.  A splint that holds your foot in a stretched, upward position while you sleep (night splint).  Physical therapy to relieve symptoms and prevent problems in the future.  Injections of steroid medicine (cortisone) to relieve pain and inflammation.  Stimulating your plantar fascia with electrical impulses (extracorporeal shock wave therapy). This is usually the last treatment option before surgery.  Surgery, if other treatments have not worked after 12 months. Follow these instructions at home:  Managing pain, stiffness, and swelling  If directed, put ice on the painful area: ? Put ice in a plastic bag, or use a frozen bottle of water. ? Place a towel between your skin and the bag or bottle. ? Roll the bottom of your foot over the bag or bottle. ? Do this for 20 minutes, 2-3 times a day.  Wear athletic shoes that have air-sole or gel-sole cushions, or try wearing soft shoe inserts that are designed for plantar fasciitis.  Raise (elevate) your foot above the level of your heart while you are sitting or lying down. Activity  Avoid activities that cause pain. Ask your health care provider what activities are safe for you.  Do physical therapy exercises and stretches as told  by your health care provider.  Try activities and forms of exercise that are easier on your joints (low-impact). Examples include swimming, water aerobics, and biking. General instructions  Take over-the-counter and prescription medicines only as told by your health care provider.  Wear a night splint while sleeping, if told by your health care provider. Loosen the splint  if your toes tingle, become numb, or turn cold and blue.  Maintain a healthy weight, or work with your health care provider to lose weight as needed.  Keep all follow-up visits as told by your health care provider. This is important. Contact a health care provider if you:  Have symptoms that do not go away after caring for yourself at home.  Have pain that gets worse.  Have pain that affects your ability to move or do your daily activities. Summary  Plantar fasciitis is a painful foot condition that affects the heel. It occurs when the band of tissue that connects the toes to the heel bone (plantar fascia) becomes irritated.  The main symptom of this condition is heel pain that may be worse after exercising too much or standing still for a long time.  Treatment varies, but it usually starts with rest, ice, compression, and elevation (RICE therapy) and over-the-counter medicines to manage pain. This information is not intended to replace advice given to you by your health care provider. Make sure you discuss any questions you have with your health care provider. Document Released: 06/11/2001 Document Revised: 07/14/2017 Document Reviewed: 07/14/2017 Elsevier Interactive Patient Education  2019 [employer]  Diabetes Mellitus and [place] care is an important part of your health, especially when you have diabetes. Diabetes may cause you to have problems because of poor blood flow (circulation) to your feet and legs, which can cause your skin to:  Become thinner and drier.  Break more easily.  Heal more slowly.  Peel and crack. You may also have nerve damage (neuropathy) in your legs and feet, causing decreased feeling in them. This means that you may not notice minor injuries to your feet that could lead to more serious problems. Noticing and addressing any potential problems early is the best way to prevent future foot problems. How to care for your feet Foot hygiene  Wash  your feet daily with warm water and mild soap. Do not use hot water. Then, pat your feet and the areas between your toes until they are completely dry. Do not soak your feet as this can dry your skin.  Trim your toenails straight across. Do not dig under them or around the cuticle. File the edges of your nails with an emery board or nail file.  Apply a moisturizing lotion or petroleum jelly to the skin on your feet and to dry, brittle toenails. Use lotion that does not contain alcohol and is unscented. Do not apply lotion between your toes. Shoes and socks  Wear clean socks or stockings every day. Make sure they are not too tight. Do not wear knee-high stockings since they may decrease blood flow to your legs.  Wear shoes that fit properly and have enough cushioning. Always look in your shoes before you put them on to be sure there are no objects inside.  To break in new shoes, wear them for just a few hours a day. This prevents injuries on your feet. Wounds, scrapes, corns, and calluses  Check your feet daily for blisters, cuts, bruises, sores, and redness. If you cannot see the bottom  of your feet, use a mirror or ask someone for help.  Do not cut corns or calluses or try to remove them with medicine.  If you find a minor scrape, cut, or break in the skin on your feet, keep it and the skin around it clean and dry. You may clean these areas with mild soap and water. Do not clean the area with peroxide, alcohol, or iodine.  If you have a wound, scrape, corn, or callus on your foot, look at it several times a day to make sure it is healing and not infected. Check for: ? Redness, swelling, or pain. ? Fluid or blood. ? Warmth. ? Pus or a bad smell. General instructions  Do not cross your legs. This may decrease blood flow to your feet.  Do not use heating pads or hot water bottles on your feet. They may burn your skin. If you have lost feeling in your feet or legs, you may not know this is  happening until it is too late.  Protect your feet from hot and cold by wearing shoes, such as at the beach or on hot pavement.  Schedule a complete foot exam at least once a year (annually) or more often if you have foot problems. If you have foot problems, report any cuts, sores, or bruises to your health care provider immediately. Contact a health care provider if:  You have a medical condition that increases your risk of infection and you have any cuts, sores, or bruises on your feet.  You have an injury that is not healing.  You have redness on your legs or feet.  You feel burning or tingling in your legs or feet.  You have pain or cramps in your legs and feet.  Your legs or feet are numb.  Your feet always feel cold.  You have pain around a toenail. Get help right away if:  You have a wound, scrape, corn, or callus on your foot and: ? You have pain, swelling, or redness that gets worse. ? You have fluid or blood coming from the wound, scrape, corn, or callus. ? Your wound, scrape, corn, or callus feels warm to the touch. ? You have pus or a bad smell coming from the wound, scrape, corn, or callus. ? You have a fever. ? You have a red line going up your leg. Summary  Check your feet every day for cuts, sores, red spots, swelling, and blisters.  Moisturize feet and legs daily.  Wear shoes that fit properly and have enough cushioning.  If you have foot problems, report any cuts, sores, or bruises to your health care provider immediately.  Schedule a complete foot exam at least once a year (annually) or more often if you have foot problems. This information is not intended to replace advice given to you by your health care provider. Make sure you discuss any questions you have with your health care provider. Document Released: 09/13/2000 Document Revised: 10/29/2017 Document Reviewed: 10/18/2016 Elsevier Interactive Patient Education  2019 [employer].

## 2019-01-27 NOTE — Progress Notes (Signed)
SUBJECTIVE: Adrienne [name] presents to clinic on today for follow up plantar fasciitis of the right heel.  Treatments on last visit include plantarfascial brace, stretching exercises and icing. Today, pain level is 7/10 on today. She states it is really painful without her plantarfascial brace.  OBJECTIVE:  Vascular: Dorsalis pedis and posterior tibial pulses palpable bilaterally. Capillary refill time immediate x 10 digits. Sparse digital hair b/l. No pedal edema b/l. No varicosities b/l. Skin temperature gradient WNL b/l.  Dermatological: Normal skin turgor, texture and tone b/l.  No skin eruptions noted b/l.  Nails 1-5 b/l normal b/l.   Neurological: Epicritic sensation grossly intact b/l and symmetrically with 10 gram monofilament.  Vibratory sensation intact b/l.   Musculoskeletal: Muscle strength 5/5 to all LE muscle groups b/l.  Negative Tinel's sign b/l.  Pain on palpation noted medial tubercle right heel.  ASSESSMENT: 1. Plantar fasciitis right foot 2. Pain in limb 3.  PLAN: 1. Patient examined today. 2. Patient's symptoms worsening. 3. Options for plantar fasciitis discussed included stretching exercises, night splint, plantar fascial strap, oral anti-inflammatories, icing, protection, local steroid injection, and custom orthotics. Patient opted for injection right heel and would like to see if orthotics are covered. 4. Discussed etiology, pathology, conservative vs. surgical therapies. A sterile skin prep was applied.  An injection consisting of 0.5 cc 1% lidocaine plain, 0.5cc 0.5% marcaine plain, 0.5 cc dexamethasone phosphate and 0.5 cc 10 mg Kenalog was infiltrated at the point of maximal tenderness on the right heel.  The patient tolerated this well and was given instructions for aftercare.  5. Continue stretching exercises and plantar fascial brace. 6. Will check benefits for orthotics for plantar fasciitis. 7. Also recommended New Balance sneakers, 600  series or higher. 8. Follow up 1 month.

## 2019-02-04 ENCOUNTER — Telehealth: Payer: Self-pay | Admitting: Podiatry

## 2019-02-04 NOTE — Telephone Encounter (Signed)
Left message for pt to call to schedule an appt with Liliane Channel for orthotics that we received the [facility] auth.

## 2019-02-05 DIAGNOSIS — F418 Other specified anxiety disorders: Secondary | ICD-10-CM | POA: Diagnosis not present

## 2019-02-11 ENCOUNTER — Ambulatory Visit (INDEPENDENT_AMBULATORY_CARE_PROVIDER_SITE_OTHER): Payer: PPO | Admitting: Orthotics

## 2019-02-11 ENCOUNTER — Other Ambulatory Visit: Payer: Self-pay

## 2019-02-11 DIAGNOSIS — M722 Plantar fascial fibromatosis: Secondary | ICD-10-CM

## 2019-02-11 DIAGNOSIS — M79604 Pain in right leg: Secondary | ICD-10-CM

## 2019-02-11 DIAGNOSIS — E119 Type 2 diabetes mellitus without complications: Secondary | ICD-10-CM

## 2019-02-11 DIAGNOSIS — L84 Corns and callosities: Secondary | ICD-10-CM

## 2019-02-11 NOTE — Progress Notes (Signed)

## 2019-02-17 ENCOUNTER — Other Ambulatory Visit: Payer: Self-pay

## 2019-02-17 ENCOUNTER — Ambulatory Visit: Payer: PPO | Admitting: Podiatry

## 2019-02-17 ENCOUNTER — Encounter: Payer: Self-pay | Admitting: Podiatry

## 2019-02-17 VITALS — Temp 97.5°F

## 2019-02-17 DIAGNOSIS — M722 Plantar fascial fibromatosis: Secondary | ICD-10-CM

## 2019-02-18 ENCOUNTER — Other Ambulatory Visit: Payer: Self-pay

## 2019-02-18 ENCOUNTER — Ambulatory Visit
Admission: RE | Admit: 2019-02-18 | Discharge: 2019-02-18 | Disposition: A | Discharge status: Home / Self Care | Payer: PPO | Source: Ambulatory Visit | Attending: Obstetrics and Gynecology | Admitting: Obstetrics and Gynecology

## 2019-02-18 DIAGNOSIS — N6310 Unspecified lump in the right breast, unspecified quadrant: Secondary | ICD-10-CM | POA: Diagnosis not present

## 2019-02-27 DIAGNOSIS — M722 Plantar fascial fibromatosis: Secondary | ICD-10-CM | POA: Insufficient documentation

## 2019-02-27 NOTE — Progress Notes (Signed)
SUBJECTIVE: Adrienne [name] presents to clinic on today for follow up plantar fasciitis of the right heel. Treatments on last visit included Kenalog 10 injection. She has been stretching as instructed.   Adrienne [name] states she will be picking up her orthotics the first week in June.   Today, pain level is 0/10. She is happy she is pain free  OBJECTIVE: Vitals:   02/17/19 1323  Temp: (!) 97.5 F (36.4 C)   Vascular: Dorsalis pedis and posterior tibial pulses palpable bilaterally.  Capillary refill time immediate x 10 digits.  No pedal edema b/l.  No varicosities b/l.  Skin temperature gradient WNL b/l.  Dermatological: Normal skin turgor, texture and tone b/l.  No skin eruptions noted b/l.  Nails 1-5 b/l normal b/l.  Neurological: Epicritic sensation grossly intact b/l and symmetrically with 10 gram monofilament.  Vibratory sensation intact b/l.  Musculoskeletal: Muscle strength 5/5 to all LE muscle groups b/l.  Negative Tinel's sign b/l.  No pain on palpation elicited at the medial tubercle right heel.  ASSESSMENT: 1. Plantar fasciitis right foot, resolved 2. Pain in foot right    PLAN: 1. Patient examined today. 2. Patient's plantar fasciitis right foot symptoms resolved. 3. Patient will pick up orthotics in a couple of weeks. Break in as instructed. 4. Continue stretching exercises for plantar fasciitis. 5. Follow up as needed.

## 2019-03-04 DIAGNOSIS — F418 Other specified anxiety disorders: Secondary | ICD-10-CM | POA: Diagnosis not present

## 2019-03-08 ENCOUNTER — Other Ambulatory Visit: Payer: Self-pay

## 2019-03-08 ENCOUNTER — Ambulatory Visit (INDEPENDENT_AMBULATORY_CARE_PROVIDER_SITE_OTHER): Payer: PPO | Admitting: Orthotics

## 2019-03-08 DIAGNOSIS — L84 Corns and callosities: Secondary | ICD-10-CM

## 2019-03-08 DIAGNOSIS — M722 Plantar fascial fibromatosis: Secondary | ICD-10-CM

## 2019-03-08 DIAGNOSIS — M79604 Pain in right leg: Secondary | ICD-10-CM

## 2019-03-08 DIAGNOSIS — E119 Type 2 diabetes mellitus without complications: Secondary | ICD-10-CM

## 2019-03-08 NOTE — Progress Notes (Signed)
Patient came in today to pick up custom made foot orthotics.  The goals were accomplished and the patient reported no dissatisfaction with said orthotics.  Patient was advised of breakin period and how to report any issues. 

## 2019-04-14 DIAGNOSIS — S6991XA Unspecified injury of right wrist, hand and finger(s), initial encounter: Secondary | ICD-10-CM | POA: Diagnosis not present

## 2019-04-14 DIAGNOSIS — S63501A Unspecified sprain of right wrist, initial encounter: Secondary | ICD-10-CM | POA: Diagnosis not present

## 2019-04-15 DIAGNOSIS — I10 Essential (primary) hypertension: Secondary | ICD-10-CM | POA: Diagnosis not present

## 2019-04-15 DIAGNOSIS — B372 Candidiasis of skin and nail: Secondary | ICD-10-CM | POA: Diagnosis not present

## 2019-04-15 DIAGNOSIS — E1129 Type 2 diabetes mellitus with other diabetic kidney complication: Secondary | ICD-10-CM | POA: Diagnosis not present

## 2019-04-22 DIAGNOSIS — F418 Other specified anxiety disorders: Secondary | ICD-10-CM | POA: Diagnosis not present

## 2019-05-07 DIAGNOSIS — Z01419 Encounter for gynecological examination (general) (routine) without abnormal findings: Secondary | ICD-10-CM | POA: Diagnosis not present

## 2019-05-07 DIAGNOSIS — B372 Candidiasis of skin and nail: Secondary | ICD-10-CM | POA: Diagnosis not present

## 2019-05-07 DIAGNOSIS — N6459 Other signs and symptoms in breast: Secondary | ICD-10-CM | POA: Diagnosis not present

## 2019-05-11 DIAGNOSIS — M79641 Pain in right hand: Secondary | ICD-10-CM | POA: Diagnosis not present

## 2019-05-11 DIAGNOSIS — M65341 Trigger finger, right ring finger: Secondary | ICD-10-CM | POA: Diagnosis not present

## 2019-05-11 DIAGNOSIS — G5601 Carpal tunnel syndrome, right upper limb: Secondary | ICD-10-CM | POA: Diagnosis not present

## 2019-05-27 DIAGNOSIS — F3130 Bipolar disorder, current episode depressed, mild or moderate severity, unspecified: Secondary | ICD-10-CM | POA: Diagnosis not present

## 2019-06-08 DIAGNOSIS — L570 Actinic keratosis: Secondary | ICD-10-CM | POA: Diagnosis not present

## 2019-06-08 DIAGNOSIS — L2089 Other atopic dermatitis: Secondary | ICD-10-CM | POA: Diagnosis not present

## 2019-06-08 DIAGNOSIS — Z85828 Personal history of other malignant neoplasm of skin: Secondary | ICD-10-CM | POA: Diagnosis not present

## 2019-06-08 DIAGNOSIS — L821 Other seborrheic keratosis: Secondary | ICD-10-CM | POA: Diagnosis not present

## 2019-06-08 DIAGNOSIS — L814 Other melanin hyperpigmentation: Secondary | ICD-10-CM | POA: Diagnosis not present

## 2019-06-08 DIAGNOSIS — D1801 Hemangioma of skin and subcutaneous tissue: Secondary | ICD-10-CM | POA: Diagnosis not present

## 2019-06-15 DIAGNOSIS — E669 Obesity, unspecified: Secondary | ICD-10-CM | POA: Diagnosis not present

## 2019-06-15 DIAGNOSIS — R809 Proteinuria, unspecified: Secondary | ICD-10-CM | POA: Diagnosis not present

## 2019-06-15 DIAGNOSIS — Z794 Long term (current) use of insulin: Secondary | ICD-10-CM | POA: Diagnosis not present

## 2019-06-15 DIAGNOSIS — F3130 Bipolar disorder, current episode depressed, mild or moderate severity, unspecified: Secondary | ICD-10-CM | POA: Diagnosis not present

## 2019-06-15 DIAGNOSIS — E1129 Type 2 diabetes mellitus with other diabetic kidney complication: Secondary | ICD-10-CM | POA: Diagnosis not present

## 2019-06-15 DIAGNOSIS — K589 Irritable bowel syndrome without diarrhea: Secondary | ICD-10-CM | POA: Diagnosis not present

## 2019-06-15 DIAGNOSIS — I209 Angina pectoris, unspecified: Secondary | ICD-10-CM | POA: Diagnosis not present

## 2019-06-15 DIAGNOSIS — E7800 Pure hypercholesterolemia, unspecified: Secondary | ICD-10-CM | POA: Diagnosis not present

## 2019-06-15 DIAGNOSIS — I10 Essential (primary) hypertension: Secondary | ICD-10-CM | POA: Diagnosis not present

## 2019-06-22 DIAGNOSIS — E1129 Type 2 diabetes mellitus with other diabetic kidney complication: Secondary | ICD-10-CM | POA: Diagnosis not present

## 2019-06-22 DIAGNOSIS — S62346D Nondisplaced fracture of base of fifth metacarpal bone, right hand, subsequent encounter for fracture with routine healing: Secondary | ICD-10-CM | POA: Diagnosis not present

## 2019-06-22 DIAGNOSIS — I10 Essential (primary) hypertension: Secondary | ICD-10-CM | POA: Diagnosis not present

## 2019-06-22 DIAGNOSIS — M65341 Trigger finger, right ring finger: Secondary | ICD-10-CM | POA: Diagnosis not present

## 2019-06-23 DIAGNOSIS — F3130 Bipolar disorder, current episode depressed, mild or moderate severity, unspecified: Secondary | ICD-10-CM | POA: Diagnosis not present

## 2019-07-13 DIAGNOSIS — E1129 Type 2 diabetes mellitus with other diabetic kidney complication: Secondary | ICD-10-CM | POA: Diagnosis not present

## 2019-07-13 DIAGNOSIS — Z794 Long term (current) use of insulin: Secondary | ICD-10-CM | POA: Diagnosis not present

## 2019-07-13 DIAGNOSIS — F418 Other specified anxiety disorders: Secondary | ICD-10-CM | POA: Diagnosis not present

## 2019-07-13 DIAGNOSIS — I10 Essential (primary) hypertension: Secondary | ICD-10-CM | POA: Diagnosis not present

## 2019-07-27 DIAGNOSIS — F3130 Bipolar disorder, current episode depressed, mild or moderate severity, unspecified: Secondary | ICD-10-CM | POA: Diagnosis not present

## 2019-08-12 ENCOUNTER — Other Ambulatory Visit: Payer: Self-pay | Admitting: *Deleted

## 2019-08-18 DIAGNOSIS — F3130 Bipolar disorder, current episode depressed, mild or moderate severity, unspecified: Secondary | ICD-10-CM | POA: Diagnosis not present

## 2019-09-15 ENCOUNTER — Ambulatory Visit: Payer: PPO | Admitting: Podiatry

## 2019-09-17 DIAGNOSIS — F418 Other specified anxiety disorders: Secondary | ICD-10-CM | POA: Diagnosis not present

## 2019-09-29 ENCOUNTER — Other Ambulatory Visit: Payer: Self-pay

## 2019-09-29 ENCOUNTER — Ambulatory Visit: Payer: PPO | Admitting: Podiatry

## 2019-09-29 DIAGNOSIS — M7731 Calcaneal spur, right foot: Secondary | ICD-10-CM

## 2019-09-29 DIAGNOSIS — M79671 Pain in right foot: Secondary | ICD-10-CM

## 2019-09-29 DIAGNOSIS — M722 Plantar fascial fibromatosis: Secondary | ICD-10-CM | POA: Diagnosis not present

## 2019-09-30 ENCOUNTER — Encounter: Payer: Self-pay | Admitting: Podiatry

## 2019-09-30 NOTE — Progress Notes (Signed)
Subjective:  Patient ID: Adrienne [name], female    DOB: 01-Jun-1951,  MRN: [id number]    68 y.o. female presents with the above complaint.  Patient is here for a follow-up of right plantar fasciitis.  Patient was treated by Dr. Consuelo Pandy who performed a series of injection as well as dispensed orthotics to help alleviate the plantar fasciitis.  She states that it gave her relief for a couple of months however the pain has came back again.  She has tried stretching it and putting ice underneath the foot which has not helped.  Her pain is 7 out of 10.  She states that the injection lasted up until November and then started wearing off gradually.  She denies any other acute complaints.   Review of Systems: Negative except as noted in the HPI. Denies N/V/F/Ch.  Past Medical History:  Diagnosis Date  . Arthritis    hands/fingers  . Dental crowns present   . Depression   . Difficulty swallowing solids    due to esophageal stricture  . Eczema   . Esophageal reflux   . Esophageal stricture   . Family history of adverse reaction to anesthesia    pt's mother had hx. of N/V and being hard to wake up post-op  . Generalized anxiety disorder   . Hyperlipidemia   . Hypertension    states under control with med., has been on med. x 30 yr.  . Insulin dependent diabetes mellitus ([place])   . Irritable bowel syndrome   . Nutcracker esophagus   . Seasonal allergies   . Trigger finger of right hand 09/2017   index and middle fingers  . Wears hearing aid in both ears     Current Outpatient Medications:  .  ALPRAZolam (XANAX) 0.25 MG tablet, TAKE 1 TABLET BY MOUTH ONCE DAILY AS NEEDED FOR ANXIETY OR SLEEP, Disp: , Rfl: 0 .  amoxicillin (AMOXIL) 500 MG capsule, amoxicillin 500 mg capsule, Disp: , Rfl:  .  amoxicillin-clavulanate (AUGMENTIN) 875-125 MG tablet, amoxicillin 875 mg-potassium clavulanate 125 mg tablet, Disp: , Rfl:  .  aspirin 81 MG tablet, Take 81 mg by mouth daily., Disp: , Rfl:  .   Aspirin-Calcium Carbonate 81-777 MG TABS, Take by mouth., Disp: , Rfl:  .  azelastine (ASTELIN) 0.1 % nasal spray, azelastine 137 mcg (0.1 %) nasal spray aerosol, Disp: , Rfl:  .  benzonatate (TESSALON) 100 MG capsule, benzonatate 100 mg capsule, Disp: , Rfl:  .  calcium carbonate (OS-CAL) 600 MG TABS tablet, Take 600 mg by mouth 2 (two) times daily with a meal., Disp: , Rfl:  .  Cetirizine HCl 10 MG CAPS, Take by mouth., Disp: , Rfl:  .  clobetasol cream (TEMOVATE) 0.05 %, , Disp: , Rfl:  .  clobetasol ointment (TEMOVATE) 0.05 %, clobetasol 0.05 % topical ointment, Disp: , Rfl:  .  clonazePAM (KLONOPIN) 0.5 MG tablet, clonazepam 0.5 mg tablet, Disp: , Rfl:  .  Cyanocobalamin (RA VITAMIN B-12 TR) 1000 MCG TBCR, Take by mouth., Disp: , Rfl:  .  doxycycline (MONODOX) 100 MG capsule, doxycycline monohydrate 100 mg capsule, Disp: , Rfl:  .  Dulaglutide (TRULICITY) 1.5 0000000 SOPN, Inject 1.5 mg into the skin once a week. WEDNESDAY, Disp: , Rfl:  .  Eluxadoline (VIBERZI) 75 MG TABS, Take 75 mg by mouth 2 (two) times daily., Disp: 180 tablet, Rfl: 3 .  empagliflozin (JARDIANCE) 25 MG TABS tablet, Take 25 mg by mouth daily., Disp: , Rfl:  .  esomeprazole (NEXIUM) 40 MG capsule, Take 40 mg by mouth daily. , Disp: , Rfl:  .  Fe Fum-FePoly-FA-Vit C-Vit B3 (INTEGRA F) 125-1 MG CAPS, Take by mouth., Disp: , Rfl:  .  fluconazole (DIFLUCAN) 150 MG tablet, fluconazole 150 mg tablet  TAKE 1 TABLET BY MOUTH ONCE DAILY FOR 7 DAYS, Disp: , Rfl:  .  fluocinonide cream (LIDEX) 0.05 %, fluocinonide 0.05 % topical cream, Disp: , Rfl:  .  fluticasone (FLONASE) 50 MCG/ACT nasal spray, Place into the nose., Disp: , Rfl:  .  furosemide (LASIX) 20 MG tablet, furosemide 20 mg tablet, Disp: , Rfl:  .  HYDROcodone-acetaminophen (NORCO) 5-325 MG tablet, 1-2 tabs po q6 hours prn pain, Disp: 20 tablet, Rfl: 0 .  HYDROcodone-homatropine (HYCODAN) 5-1.5 MG/5ML syrup, hydrocodone-homatropine 5 mg-1.5 mg/5 mL oral syrup, Disp: ,  Rfl:  .  hydrocortisone (ANUSOL-HC) 25 MG suppository, hydrocortisone acetate 25 mg rectal suppository, Disp: , Rfl:  .  ibuprofen (ADVIL) 600 MG tablet, Take 600 mg by mouth every 6 (six) hours as needed., Disp: , Rfl:  .  imipramine (TOFRANIL) 25 MG tablet, Take by mouth., Disp: , Rfl:  .  insulin glargine (LANTUS) 100 UNIT/ML injection, Inject 40 Units into the skin daily. , Disp: , Rfl:  .  isosorbide mononitrate (IMDUR) 30 MG 24 hr tablet, Take 15 mg by mouth daily., Disp: , Rfl:  .  metFORMIN (GLUCOPHAGE) 1000 MG tablet, Take 1,000 mg by mouth 2 (two) times daily with a meal. , Disp: , Rfl:  .  Multiple Vitamin (MULTIVITAMIN) tablet, Take 1 tablet by mouth daily., Disp: , Rfl:  .  nystatin (NYSTATIN) powder, Nystop 100,000 unit/gram topical powder  USE TO AFFECTED AREAS ON THE SKIN TWICE DAILY AS NEEDED, Disp: , Rfl:  .  olanzapine-FLUoxetine (SYMBYAX) 6-25 MG per capsule, Take 1 capsule by mouth every evening., Disp: , Rfl:  .  ramipril (ALTACE) 10 MG tablet, Take 10 mg by mouth daily., Disp: , Rfl:  .  simvastatin (ZOCOR) 40 MG tablet, Take 40 mg by mouth every evening., Disp: , Rfl:   Current Facility-Administered Medications:  .  dexamethasone (DECADRON) injection 2 mg, 2 mg, Other, Once, Marzetta Board, DPM  Social History   Tobacco Use  Smoking Status Never Smoker  Smokeless Tobacco Never Used    Allergies  Allergen Reactions  . Sulfa Antibiotics Rash   Objective:  There were no vitals filed for this visit. There is no height or weight on file to calculate BMI. Constitutional Well developed. Well nourished.  Vascular Dorsalis pedis pulses palpable bilaterally. Posterior tibial pulses palpable bilaterally. Capillary refill normal to all digits.  No cyanosis or clubbing noted. Pedal hair growth normal.  Neurologic Normal speech. Oriented to person, place, and time. Epicritic sensation to light touch grossly present bilaterally.  Dermatologic Nails well groomed  and normal in appearance. No open wounds. No skin lesions.  Orthopedic: Normal joint ROM without pain or crepitus bilaterally. No visible deformities. Tender to palpation at the calcaneal tuber right. No pain with calcaneal squeeze right. Ankle ROM diminished range of motion right. Sil[place]old Test: positive right.   Radiographs: None  Assessment:   1. Plantar fasciitis of right foot   2. Pain of right heel   3. Heel spur, right    Plan:  Patient was evaluated and treated and all questions answered.  Plantar Fasciitis, right - XR reviewed as above.  - Re- Educated on icing and stretching. Instructions given.  - Injection delivered to  the plantar fascia as below. - DME: Patient already has a plantar fascial brace I instructed to replace the brace back on again. - Pharmacologic management: None -I encourage her her to wear good supportive sneakers such as new balance or Brooks with the custom-made orthotics shoe that she has already made.  She states that she would do so. -If patient pain does not resolve, I will place her in a cam boot/more aggressive immobilization during next visit.  Procedure: Injection Tendon/Ligament Location: Right plantar fascia at the glabrous junction; medial approach. Skin Prep: alcohol Injectate: 0.5 cc 0.5% marcaine plain, 0.5 cc of 1% Lidocaine, 0.5 cc kenalog 10. Disposition: Patient tolerated procedure well. Injection site dressed with a band-aid.  Return in about 4 weeks (around 10/27/2019).

## 2019-10-13 DIAGNOSIS — E1129 Type 2 diabetes mellitus with other diabetic kidney complication: Secondary | ICD-10-CM | POA: Diagnosis not present

## 2019-10-13 DIAGNOSIS — I10 Essential (primary) hypertension: Secondary | ICD-10-CM | POA: Diagnosis not present

## 2019-10-25 DIAGNOSIS — F418 Other specified anxiety disorders: Secondary | ICD-10-CM | POA: Diagnosis not present

## 2019-10-27 ENCOUNTER — Ambulatory Visit (INDEPENDENT_AMBULATORY_CARE_PROVIDER_SITE_OTHER): Payer: PPO | Admitting: Podiatry

## 2019-10-27 ENCOUNTER — Other Ambulatory Visit: Payer: Self-pay

## 2019-10-27 DIAGNOSIS — M722 Plantar fascial fibromatosis: Secondary | ICD-10-CM | POA: Diagnosis not present

## 2019-10-27 DIAGNOSIS — M79671 Pain in right foot: Secondary | ICD-10-CM

## 2019-10-27 DIAGNOSIS — M7731 Calcaneal spur, right foot: Secondary | ICD-10-CM

## 2019-10-28 ENCOUNTER — Encounter: Payer: Self-pay | Admitting: Podiatry

## 2019-10-28 NOTE — Progress Notes (Signed)
Subjective:  Patient ID: Adrienne [name], female    DOB: [birth year],  MRN: [id number]    69 y.o. female presents with the above complaint.  Patient is here to follow-up for right plantar fasciitis.  Patient states that she is feeling a little bit better.  She still has some pain on the right plantar heel.  Her pain is 7 out of 10.  Pain is elevated when walking.  Patient states plantar fascial brace has been helping out a lot.  She does not like the injections are helping.  She said the last injection that she was given helped decrease the pain a little bit but did not alleviate the pain.  She denies any other acute complaints.   Review of Systems: Negative except as noted in the HPI. Denies N/V/F/Ch.  Past Medical History:  Diagnosis Date  . Arthritis    hands/fingers  . Dental crowns present   . Depression   . Difficulty swallowing solids    due to esophageal stricture  . Eczema   . Esophageal reflux   . Esophageal stricture   . Family history of adverse reaction to anesthesia    pt's mother had hx. of N/V and being hard to wake up post-op  . Generalized anxiety disorder   . Hyperlipidemia   . Hypertension    states under control with med., has been on med. x 30 yr.  . Insulin dependent diabetes mellitus   . Irritable bowel syndrome   . Nutcracker esophagus   . Seasonal allergies   . Trigger finger of right hand 09/2017   index and middle fingers  . Wears hearing aid in both ears     Current Outpatient Medications:  .  ALPRAZolam (XANAX) 0.25 MG tablet, TAKE 1 TABLET BY MOUTH ONCE DAILY AS NEEDED FOR ANXIETY OR SLEEP, Disp: , Rfl: 0 .  amoxicillin (AMOXIL) 500 MG capsule, amoxicillin 500 mg capsule, Disp: , Rfl:  .  amoxicillin-clavulanate (AUGMENTIN) 875-125 MG tablet, amoxicillin 875 mg-potassium clavulanate 125 mg tablet, Disp: , Rfl:  .  aspirin 81 MG tablet, Take 81 mg by mouth daily., Disp: , Rfl:  .  Aspirin-Calcium Carbonate 81-777 MG TABS, Take by mouth., Disp: ,  Rfl:  .  azelastine (ASTELIN) 0.1 % nasal spray, azelastine 137 mcg (0.1 %) nasal spray aerosol, Disp: , Rfl:  .  benzonatate (TESSALON) 100 MG capsule, benzonatate 100 mg capsule, Disp: , Rfl:  .  calcium carbonate (OS-CAL) 600 MG TABS tablet, Take 600 mg by mouth 2 (two) times daily with a meal., Disp: , Rfl:  .  Cetirizine HCl 10 MG CAPS, Take by mouth., Disp: , Rfl:  .  clobetasol cream (TEMOVATE) 0.05 %, , Disp: , Rfl:  .  clobetasol ointment (TEMOVATE) 0.05 %, clobetasol 0.05 % topical ointment, Disp: , Rfl:  .  clonazePAM (KLONOPIN) 0.5 MG tablet, clonazepam 0.5 mg tablet, Disp: , Rfl:  .  Cyanocobalamin (RA VITAMIN B-12 TR) 1000 MCG TBCR, Take by mouth., Disp: , Rfl:  .  doxycycline (MONODOX) 100 MG capsule, doxycycline monohydrate 100 mg capsule, Disp: , Rfl:  .  Dulaglutide (TRULICITY) 1.5 0000000 SOPN, Inject 1.5 mg into the skin once a week. WEDNESDAY, Disp: , Rfl:  .  Eluxadoline (VIBERZI) 75 MG TABS, Take 75 mg by mouth 2 (two) times daily., Disp: 180 tablet, Rfl: 3 .  empagliflozin (JARDIANCE) 25 MG TABS tablet, Take 25 mg by mouth daily., Disp: , Rfl:  .  esomeprazole (NEXIUM) 40 MG capsule, Take  40 mg by mouth daily. , Disp: , Rfl:  .  Fe Fum-FePoly-FA-Vit C-Vit B3 (INTEGRA F) 125-1 MG CAPS, Take by mouth., Disp: , Rfl:  .  fluconazole (DIFLUCAN) 150 MG tablet, fluconazole 150 mg tablet  TAKE 1 TABLET BY MOUTH ONCE DAILY FOR 7 DAYS, Disp: , Rfl:  .  fluocinonide cream (LIDEX) 0.05 %, fluocinonide 0.05 % topical cream, Disp: , Rfl:  .  fluticasone (FLONASE) 50 MCG/ACT nasal spray, Place into the nose., Disp: , Rfl:  .  furosemide (LASIX) 20 MG tablet, furosemide 20 mg tablet, Disp: , Rfl:  .  HYDROcodone-acetaminophen (NORCO) 5-325 MG tablet, 1-2 tabs po q6 hours prn pain, Disp: 20 tablet, Rfl: 0 .  HYDROcodone-homatropine (HYCODAN) 5-1.5 MG/5ML syrup, hydrocodone-homatropine 5 mg-1.5 mg/5 mL oral syrup, Disp: , Rfl:  .  hydrocortisone (ANUSOL-HC) 25 MG suppository,  hydrocortisone acetate 25 mg rectal suppository, Disp: , Rfl:  .  ibuprofen (ADVIL) 600 MG tablet, Take 600 mg by mouth every 6 (six) hours as needed., Disp: , Rfl:  .  imipramine (TOFRANIL) 25 MG tablet, Take by mouth., Disp: , Rfl:  .  insulin glargine (LANTUS) 100 UNIT/ML injection, Inject 40 Units into the skin daily. , Disp: , Rfl:  .  isosorbide mononitrate (IMDUR) 30 MG 24 hr tablet, Take 15 mg by mouth daily., Disp: , Rfl:  .  metFORMIN (GLUCOPHAGE) 1000 MG tablet, Take 1,000 mg by mouth 2 (two) times daily with a meal. , Disp: , Rfl:  .  Multiple Vitamin (MULTIVITAMIN) tablet, Take 1 tablet by mouth daily., Disp: , Rfl:  .  nystatin (NYSTATIN) powder, Nystop 100,000 unit/gram topical powder  USE TO AFFECTED AREAS ON THE SKIN TWICE DAILY AS NEEDED, Disp: , Rfl:  .  olanzapine-FLUoxetine (SYMBYAX) 6-25 MG per capsule, Take 1 capsule by mouth every evening., Disp: , Rfl:  .  ramipril (ALTACE) 10 MG tablet, Take 10 mg by mouth daily., Disp: , Rfl:  .  simvastatin (ZOCOR) 40 MG tablet, Take 40 mg by mouth every evening., Disp: , Rfl:   Current Facility-Administered Medications:  .  dexamethasone (DECADRON) injection 2 mg, 2 mg, Other, Once, Marzetta Board, DPM  Social History   Tobacco Use  Smoking Status Never Smoker  Smokeless Tobacco Never Used    Allergies  Allergen Reactions  . Sulfa Antibiotics Rash   Objective:  There were no vitals filed for this visit. There is no height or weight on file to calculate BMI. Constitutional Well developed. Well nourished.  Vascular Dorsalis pedis pulses palpable bilaterally. Posterior tibial pulses palpable bilaterally. Capillary refill normal to all digits.  No cyanosis or clubbing noted. Pedal hair growth normal.  Neurologic Normal speech. Oriented to person, place, and time. Epicritic sensation to light touch grossly present bilaterally.  Dermatologic Nails well groomed and normal in appearance. No open wounds. No skin  lesions.  Orthopedic: Normal joint ROM without pain or crepitus bilaterally. No visible deformities. Tender to palpation at the calcaneal tuber right. No pain with calcaneal squeeze right. Ankle ROM diminished range of motion right. Silfverskiold Test: positive right.   Radiographs: None  Assessment:   1. Plantar fasciitis of right foot   2. Pain of right heel   3. Heel spur, right    Plan:  Patient was evaluated and treated and all questions answered.  Plantar Fasciitis, right - XR reviewed as above.  - Re- Educated on icing and stretching. Instructions given.  This will be her second injection by me. - Injection  delivered to the plantar fascia as below. - DME: Night splint - Pharmacologic management: None  -I encourage her her to wear good supportive sneakers such as new balance or Brooks with the custom-made orthotics shoe that she has already made.  She states that she would do so. -If patient pain does not resolve, I will place her in a cam boot/more aggressive immobilization during next visit.  Procedure: Injection Tendon/Ligament Location: Right plantar fascia at the glabrous junction; medial approach. Skin Prep: alcohol Injectate: 0.5 cc 0.5% marcaine plain, 0.5 cc of 1% Lidocaine, 0.5 cc kenalog 10. Disposition: Patient tolerated procedure well. Injection site dressed with a band-aid.  No follow-ups on file.

## 2019-11-24 DIAGNOSIS — E1129 Type 2 diabetes mellitus with other diabetic kidney complication: Secondary | ICD-10-CM | POA: Diagnosis not present

## 2019-11-24 DIAGNOSIS — E7800 Pure hypercholesterolemia, unspecified: Secondary | ICD-10-CM | POA: Diagnosis not present

## 2019-11-24 DIAGNOSIS — M8580 Other specified disorders of bone density and structure, unspecified site: Secondary | ICD-10-CM | POA: Diagnosis not present

## 2019-11-24 DIAGNOSIS — M859 Disorder of bone density and structure, unspecified: Secondary | ICD-10-CM | POA: Diagnosis not present

## 2019-11-25 DIAGNOSIS — F418 Other specified anxiety disorders: Secondary | ICD-10-CM | POA: Diagnosis not present

## 2019-11-26 ENCOUNTER — Ambulatory Visit: Payer: PPO | Admitting: Podiatry

## 2019-11-26 ENCOUNTER — Other Ambulatory Visit: Payer: Self-pay

## 2019-11-26 DIAGNOSIS — M79671 Pain in right foot: Secondary | ICD-10-CM

## 2019-11-26 DIAGNOSIS — M722 Plantar fascial fibromatosis: Secondary | ICD-10-CM

## 2019-11-30 ENCOUNTER — Encounter: Payer: Self-pay | Admitting: Podiatry

## 2019-11-30 NOTE — Progress Notes (Signed)
Subjective:  Patient ID: Adrienne [name], female    DOB: 07-15-1951,  MRN: [id number]    69 y.o. female presents with the above complaint.  Patient is here for follow-up of right plantar fasciitis.  Patient states her right plantar fascia has improved considerably.  She has made the shoe gear modification as we discussed.  She also obtain custom-made orthotics which has been helping.  She has been wearing her night splint as well.  She still has pain but has improved overall.  She would like to know if there is anything else that could be done to help decrease a little bit of pain that she has.   Review of Systems: Negative except as noted in the HPI. Denies N/V/F/Ch.  Past Medical History:  Diagnosis Date  . Arthritis    hands/fingers  . Dental crowns present   . Depression   . Difficulty swallowing solids    due to esophageal stricture  . Eczema   . Esophageal reflux   . Esophageal stricture   . Family history of adverse reaction to anesthesia    pt's mother had hx. of N/V and being hard to wake up post-op  . Generalized anxiety disorder   . Hyperlipidemia   . Hypertension    states under control with med., has been on med. x 30 yr.  . Insulin dependent diabetes mellitus   . Irritable bowel syndrome   . Nutcracker esophagus   . Seasonal allergies   . Trigger finger of right hand 09/2017   index and middle fingers  . Wears hearing aid in both ears     Current Outpatient Medications:  .  ALPRAZolam (XANAX) 0.25 MG tablet, TAKE 1 TABLET BY MOUTH ONCE DAILY AS NEEDED FOR ANXIETY OR SLEEP, Disp: , Rfl: 0 .  amoxicillin (AMOXIL) 500 MG capsule, amoxicillin 500 mg capsule, Disp: , Rfl:  .  amoxicillin-clavulanate (AUGMENTIN) 875-125 MG tablet, amoxicillin 875 mg-potassium clavulanate 125 mg tablet, Disp: , Rfl:  .  aspirin 81 MG tablet, Take 81 mg by mouth daily., Disp: , Rfl:  .  Aspirin-Calcium Carbonate 81-777 MG TABS, Take by mouth., Disp: , Rfl:  .  azelastine (ASTELIN)  0.1 % nasal spray, azelastine 137 mcg (0.1 %) nasal spray aerosol, Disp: , Rfl:  .  benzonatate (TESSALON) 100 MG capsule, benzonatate 100 mg capsule, Disp: , Rfl:  .  calcium carbonate (OS-CAL) 600 MG TABS tablet, Take 600 mg by mouth 2 (two) times daily with a meal., Disp: , Rfl:  .  Cetirizine HCl 10 MG CAPS, Take by mouth., Disp: , Rfl:  .  clobetasol cream (TEMOVATE) 0.05 %, , Disp: , Rfl:  .  clobetasol ointment (TEMOVATE) 0.05 %, clobetasol 0.05 % topical ointment, Disp: , Rfl:  .  clonazePAM (KLONOPIN) 0.5 MG tablet, clonazepam 0.5 mg tablet, Disp: , Rfl:  .  Cyanocobalamin (RA VITAMIN B-12 TR) 1000 MCG TBCR, Take by mouth., Disp: , Rfl:  .  doxycycline (MONODOX) 100 MG capsule, doxycycline monohydrate 100 mg capsule, Disp: , Rfl:  .  Dulaglutide (TRULICITY) 1.5 0000000 SOPN, Inject 1.5 mg into the skin once a week. WEDNESDAY, Disp: , Rfl:  .  Eluxadoline (VIBERZI) 75 MG TABS, Take 75 mg by mouth 2 (two) times daily., Disp: 180 tablet, Rfl: 3 .  empagliflozin (JARDIANCE) 25 MG TABS tablet, Take 25 mg by mouth daily., Disp: , Rfl:  .  esomeprazole (NEXIUM) 40 MG capsule, Take 40 mg by mouth daily. , Disp: , Rfl:  .  Fe Fum-FePoly-FA-Vit C-Vit B3 (INTEGRA F) 125-1 MG CAPS, Take by mouth., Disp: , Rfl:  .  fluconazole (DIFLUCAN) 150 MG tablet, fluconazole 150 mg tablet  TAKE 1 TABLET BY MOUTH ONCE DAILY FOR 7 DAYS, Disp: , Rfl:  .  fluocinonide cream (LIDEX) 0.05 %, fluocinonide 0.05 % topical cream, Disp: , Rfl:  .  fluticasone (FLONASE) 50 MCG/ACT nasal spray, Place into the nose., Disp: , Rfl:  .  furosemide (LASIX) 20 MG tablet, furosemide 20 mg tablet, Disp: , Rfl:  .  HYDROcodone-acetaminophen (NORCO) 5-325 MG tablet, 1-2 tabs po q6 hours prn pain, Disp: 20 tablet, Rfl: 0 .  HYDROcodone-homatropine (HYCODAN) 5-1.5 MG/5ML syrup, hydrocodone-homatropine 5 mg-1.5 mg/5 mL oral syrup, Disp: , Rfl:  .  hydrocortisone (ANUSOL-HC) 25 MG suppository, hydrocortisone acetate 25 mg rectal  suppository, Disp: , Rfl:  .  ibuprofen (ADVIL) 600 MG tablet, Take 600 mg by mouth every 6 (six) hours as needed., Disp: , Rfl:  .  imipramine (TOFRANIL) 25 MG tablet, Take by mouth., Disp: , Rfl:  .  insulin glargine (LANTUS) 100 UNIT/ML injection, Inject 40 Units into the skin daily. , Disp: , Rfl:  .  isosorbide mononitrate (IMDUR) 30 MG 24 hr tablet, Take 15 mg by mouth daily., Disp: , Rfl:  .  metFORMIN (GLUCOPHAGE) 1000 MG tablet, Take 1,000 mg by mouth 2 (two) times daily with a meal. , Disp: , Rfl:  .  Multiple Vitamin (MULTIVITAMIN) tablet, Take 1 tablet by mouth daily., Disp: , Rfl:  .  nystatin (NYSTATIN) powder, Nystop 100,000 unit/gram topical powder  USE TO AFFECTED AREAS ON THE SKIN TWICE DAILY AS NEEDED, Disp: , Rfl:  .  olanzapine-FLUoxetine (SYMBYAX) 6-25 MG per capsule, Take 1 capsule by mouth every evening., Disp: , Rfl:  .  ramipril (ALTACE) 10 MG tablet, Take 10 mg by mouth daily., Disp: , Rfl:  .  simvastatin (ZOCOR) 40 MG tablet, Take 40 mg by mouth every evening., Disp: , Rfl:   Current Facility-Administered Medications:  .  dexamethasone (DECADRON) injection 2 mg, 2 mg, Other, Once, Marzetta Board, DPM  Social History   Tobacco Use  Smoking Status Never Smoker  Smokeless Tobacco Never Used    Allergies  Allergen Reactions  . Sulfa Antibiotics Rash   Objective:  There were no vitals filed for this visit. There is no height or weight on file to calculate BMI. Constitutional Well developed. Well nourished.  Vascular Dorsalis pedis pulses palpable bilaterally. Posterior tibial pulses palpable bilaterally. Capillary refill normal to all digits.  No cyanosis or clubbing noted. Pedal hair growth normal.  Neurologic Normal speech. Oriented to person, place, and time. Epicritic sensation to light touch grossly present bilaterally.  Dermatologic Nails well groomed and normal in appearance. No open wounds. No skin lesions.  Orthopedic: Normal joint ROM  without pain or crepitus bilaterally. No visible deformities. Tender to palpation at the calcaneal tuber right. No pain with calcaneal squeeze right. Ankle ROM diminished range of motion right. Silfverskiold Test: positive right.   Radiographs: None  Assessment:   1. Plantar fasciitis of right foot   2. Pain of right heel    Plan:  Patient was evaluated and treated and all questions answered.  Plantar Fasciitis, right -The pain is improving slowly.  Overall she is much better than when she first came to visit me.  I discussed with her the importance of wearing the custom-made orthotics that she had obtained.  She obtain new pair shoes to help help  improve some of the plantar fasciitis.  I will hold off on any further injection at this time as they are no longer helping her. -I encourage her her to wear good supportive sneakers such as new balance or Brooks with the custom-made orthotics shoe that she has already made.  She states that she would do so. -If patient pain does not resolve, I will place her in a cam boot/more aggressive immobilization during next visit.  No follow-ups on file.

## 2019-12-01 DIAGNOSIS — K589 Irritable bowel syndrome without diarrhea: Secondary | ICD-10-CM | POA: Diagnosis not present

## 2019-12-01 DIAGNOSIS — E1129 Type 2 diabetes mellitus with other diabetic kidney complication: Secondary | ICD-10-CM | POA: Diagnosis not present

## 2019-12-01 DIAGNOSIS — Z1331 Encounter for screening for depression: Secondary | ICD-10-CM | POA: Diagnosis not present

## 2019-12-01 DIAGNOSIS — F3130 Bipolar disorder, current episode depressed, mild or moderate severity, unspecified: Secondary | ICD-10-CM | POA: Diagnosis not present

## 2019-12-01 DIAGNOSIS — E7800 Pure hypercholesterolemia, unspecified: Secondary | ICD-10-CM | POA: Diagnosis not present

## 2019-12-01 DIAGNOSIS — F418 Other specified anxiety disorders: Secondary | ICD-10-CM | POA: Diagnosis not present

## 2019-12-01 DIAGNOSIS — D692 Other nonthrombocytopenic purpura: Secondary | ICD-10-CM | POA: Diagnosis not present

## 2019-12-01 DIAGNOSIS — R82998 Other abnormal findings in urine: Secondary | ICD-10-CM | POA: Diagnosis not present

## 2019-12-01 DIAGNOSIS — M8580 Other specified disorders of bone density and structure, unspecified site: Secondary | ICD-10-CM | POA: Diagnosis not present

## 2019-12-01 DIAGNOSIS — I209 Angina pectoris, unspecified: Secondary | ICD-10-CM | POA: Diagnosis not present

## 2019-12-01 DIAGNOSIS — G5601 Carpal tunnel syndrome, right upper limb: Secondary | ICD-10-CM | POA: Diagnosis not present

## 2019-12-01 DIAGNOSIS — E669 Obesity, unspecified: Secondary | ICD-10-CM | POA: Diagnosis not present

## 2019-12-01 DIAGNOSIS — Z0000 Encounter for general adult medical examination without abnormal findings: Secondary | ICD-10-CM | POA: Diagnosis not present

## 2019-12-01 DIAGNOSIS — Z1339 Encounter for screening examination for other mental health and behavioral disorders: Secondary | ICD-10-CM | POA: Diagnosis not present

## 2019-12-01 DIAGNOSIS — I10 Essential (primary) hypertension: Secondary | ICD-10-CM | POA: Diagnosis not present

## 2019-12-21 DIAGNOSIS — F418 Other specified anxiety disorders: Secondary | ICD-10-CM | POA: Diagnosis not present

## 2019-12-24 ENCOUNTER — Encounter: Payer: Self-pay | Admitting: Podiatry

## 2019-12-24 ENCOUNTER — Other Ambulatory Visit: Payer: Self-pay

## 2019-12-24 ENCOUNTER — Ambulatory Visit: Payer: PPO | Admitting: Podiatry

## 2019-12-24 DIAGNOSIS — M79671 Pain in right foot: Secondary | ICD-10-CM | POA: Diagnosis not present

## 2019-12-24 DIAGNOSIS — M722 Plantar fascial fibromatosis: Secondary | ICD-10-CM

## 2019-12-24 NOTE — Progress Notes (Signed)
Subjective:  Patient ID: Adrienne [name], female    DOB: [birth year],  MRN: [id number]    69 y.o. female presents with the above complaint.  Patient is here for follow-up of right plantar fasciitis.  Patient states her right plantar fascia has improved completely.  She has made the shoe gear modification that we discussed.  Patient has been wearing her orthotics as planned.  She has also been wearing her bracing.  I have asked her to stop wearing her brace and start transitioning without the bracing.  I have mentioned to her the long-term management of plantar fasciitis is custom-made orthotics with good shoes with stretching exercises.  Patient states understanding.   Review of Systems: Negative except as noted in the HPI. Denies N/V/F/Ch.  Past Medical History:  Diagnosis Date  . Arthritis    hands/fingers  . Dental crowns present   . Depression   . Difficulty swallowing solids    due to esophageal stricture  . Eczema   . Esophageal reflux   . Esophageal stricture   . Family history of adverse reaction to anesthesia    pt's mother had hx. of N/V and being hard to wake up post-op  . Generalized anxiety disorder   . Hyperlipidemia   . Hypertension    states under control with med., has been on med. x 30 yr.  . Insulin dependent diabetes mellitus   . Irritable bowel syndrome   . Nutcracker esophagus   . Seasonal allergies   . Trigger finger of right hand 09/2017   index and middle fingers  . Wears hearing aid in both ears     Current Outpatient Medications:  .  ALPRAZolam (XANAX) 0.25 MG tablet, TAKE 1 TABLET BY MOUTH ONCE DAILY AS NEEDED FOR ANXIETY OR SLEEP, Disp: , Rfl: 0 .  amoxicillin (AMOXIL) 500 MG capsule, amoxicillin 500 mg capsule, Disp: , Rfl:  .  amoxicillin-clavulanate (AUGMENTIN) 875-125 MG tablet, amoxicillin 875 mg-potassium clavulanate 125 mg tablet, Disp: , Rfl:  .  aspirin 81 MG tablet, Take 81 mg by mouth daily., Disp: , Rfl:  .  Aspirin-Calcium Carbonate  81-777 MG TABS, Take by mouth., Disp: , Rfl:  .  azelastine (ASTELIN) 0.1 % nasal spray, azelastine 137 mcg (0.1 %) nasal spray aerosol, Disp: , Rfl:  .  benzonatate (TESSALON) 100 MG capsule, benzonatate 100 mg capsule, Disp: , Rfl:  .  calcium carbonate (OS-CAL) 600 MG TABS tablet, Take 600 mg by mouth 2 (two) times daily with a meal., Disp: , Rfl:  .  Cetirizine HCl 10 MG CAPS, Take by mouth., Disp: , Rfl:  .  clobetasol cream (TEMOVATE) 0.05 %, , Disp: , Rfl:  .  clobetasol ointment (TEMOVATE) 0.05 %, clobetasol 0.05 % topical ointment, Disp: , Rfl:  .  clonazePAM (KLONOPIN) 0.5 MG tablet, clonazepam 0.5 mg tablet, Disp: , Rfl:  .  Cyanocobalamin (RA VITAMIN B-12 TR) 1000 MCG TBCR, Take by mouth., Disp: , Rfl:  .  doxycycline (MONODOX) 100 MG capsule, doxycycline monohydrate 100 mg capsule, Disp: , Rfl:  .  Dulaglutide (TRULICITY) 1.5 0000000 SOPN, Inject 1.5 mg into the skin once a week. WEDNESDAY, Disp: , Rfl:  .  Eluxadoline (VIBERZI) 75 MG TABS, Take 75 mg by mouth 2 (two) times daily., Disp: 180 tablet, Rfl: 3 .  empagliflozin (JARDIANCE) 25 MG TABS tablet, Take 25 mg by mouth daily., Disp: , Rfl:  .  esomeprazole (NEXIUM) 40 MG capsule, Take 40 mg by mouth daily. , Disp: ,  Rfl:  .  Fe Fum-FePoly-FA-Vit C-Vit B3 (INTEGRA F) 125-1 MG CAPS, Take by mouth., Disp: , Rfl:  .  fluconazole (DIFLUCAN) 150 MG tablet, fluconazole 150 mg tablet  TAKE 1 TABLET BY MOUTH ONCE DAILY FOR 7 DAYS, Disp: , Rfl:  .  fluocinonide cream (LIDEX) 0.05 %, fluocinonide 0.05 % topical cream, Disp: , Rfl:  .  fluticasone (FLONASE) 50 MCG/ACT nasal spray, Place into the nose., Disp: , Rfl:  .  furosemide (LASIX) 20 MG tablet, furosemide 20 mg tablet, Disp: , Rfl:  .  HYDROcodone-acetaminophen (NORCO) 5-325 MG tablet, 1-2 tabs po q6 hours prn pain, Disp: 20 tablet, Rfl: 0 .  HYDROcodone-homatropine (HYCODAN) 5-1.5 MG/5ML syrup, hydrocodone-homatropine 5 mg-1.5 mg/5 mL oral syrup, Disp: , Rfl:  .  hydrocortisone  (ANUSOL-HC) 25 MG suppository, hydrocortisone acetate 25 mg rectal suppository, Disp: , Rfl:  .  ibuprofen (ADVIL) 600 MG tablet, Take 600 mg by mouth every 6 (six) hours as needed., Disp: , Rfl:  .  imipramine (TOFRANIL) 25 MG tablet, Take by mouth., Disp: , Rfl:  .  insulin glargine (LANTUS) 100 UNIT/ML injection, Inject 40 Units into the skin daily. , Disp: , Rfl:  .  isosorbide mononitrate (IMDUR) 30 MG 24 hr tablet, Take 15 mg by mouth daily., Disp: , Rfl:  .  metFORMIN (GLUCOPHAGE) 1000 MG tablet, Take 1,000 mg by mouth 2 (two) times daily with a meal. , Disp: , Rfl:  .  Multiple Vitamin (MULTIVITAMIN) tablet, Take 1 tablet by mouth daily., Disp: , Rfl:  .  nystatin (NYSTATIN) powder, Nystop 100,000 unit/gram topical powder  USE TO AFFECTED AREAS ON THE SKIN TWICE DAILY AS NEEDED, Disp: , Rfl:  .  olanzapine-FLUoxetine (SYMBYAX) 6-25 MG per capsule, Take 1 capsule by mouth every evening., Disp: , Rfl:  .  ramipril (ALTACE) 10 MG tablet, Take 10 mg by mouth daily., Disp: , Rfl:  .  simvastatin (ZOCOR) 40 MG tablet, Take 40 mg by mouth every evening., Disp: , Rfl:   Current Facility-Administered Medications:  .  dexamethasone (DECADRON) injection 2 mg, 2 mg, Other, Once, Marzetta Board, DPM  Social History   Tobacco Use  Smoking Status Never Smoker  Smokeless Tobacco Never Used    Allergies  Allergen Reactions  . Sulfa Antibiotics Rash   Objective:  There were no vitals filed for this visit. There is no height or weight on file to calculate BMI. Constitutional Well developed. Well nourished.  Vascular Dorsalis pedis pulses palpable bilaterally. Posterior tibial pulses palpable bilaterally. Capillary refill normal to all digits.  No cyanosis or clubbing noted. Pedal hair growth normal.  Neurologic Normal speech. Oriented to person, place, and time. Epicritic sensation to light touch grossly present bilaterally.  Dermatologic Nails well groomed and normal in  appearance. No open wounds. No skin lesions.  Orthopedic: Normal joint ROM without pain or crepitus bilaterally. No visible deformities. No tender to palpation at the calcaneal tuber right. No pain with calcaneal squeeze right. Ankle ROM diminished range of motion right. Silfverskiold Test: positive right.   Radiographs: None  Assessment:   1. Plantar fasciitis of right foot   2. Pain of right heel    Plan:  Patient was evaluated and treated and all questions answered.  Plantar Fasciitis, right -Plantar fasciitis to the right side has completely improved.  Patient does not have any pain on palpation.  Patient has been wearing her new balance sneakers as well as a custom-made orthotics which has been aggressively helping.  I have asked her to start transitioning and taking the brace away now.  Patient states understanding and will do so. -I will see her back as needed.  I have asked her that if the plantar fasciitis pain comes back come back and see me right away patient states understanding and will do so  No follow-ups on file.

## 2020-01-19 DIAGNOSIS — I10 Essential (primary) hypertension: Secondary | ICD-10-CM | POA: Diagnosis not present

## 2020-01-19 DIAGNOSIS — E1129 Type 2 diabetes mellitus with other diabetic kidney complication: Secondary | ICD-10-CM | POA: Diagnosis not present

## 2020-01-20 DIAGNOSIS — F418 Other specified anxiety disorders: Secondary | ICD-10-CM | POA: Diagnosis not present

## 2020-02-29 IMAGING — MG DIGITAL SCREENING BILATERAL MAMMOGRAM WITH TOMO AND CAD
8 series · 8 of 24 positions shown · non-contrast
Comparison: Previous exam(s).

CLINICAL DATA: Screening.

EXAM:
DIGITAL SCREENING BILATERAL MAMMOGRAM WITH TOMO AND CAD

[R MLO synth-2D]
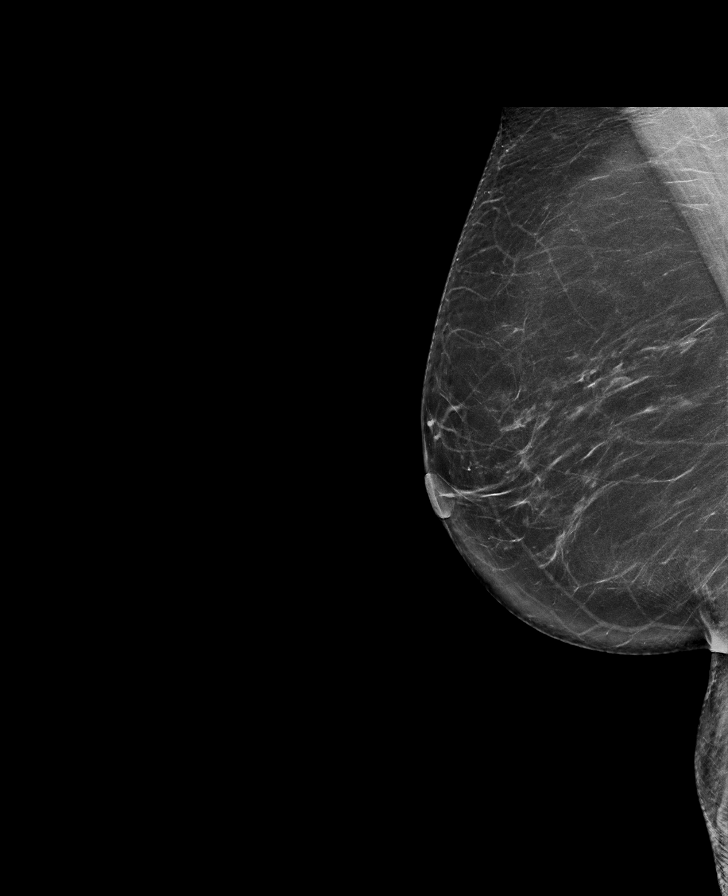

[L MLO synth-2D]
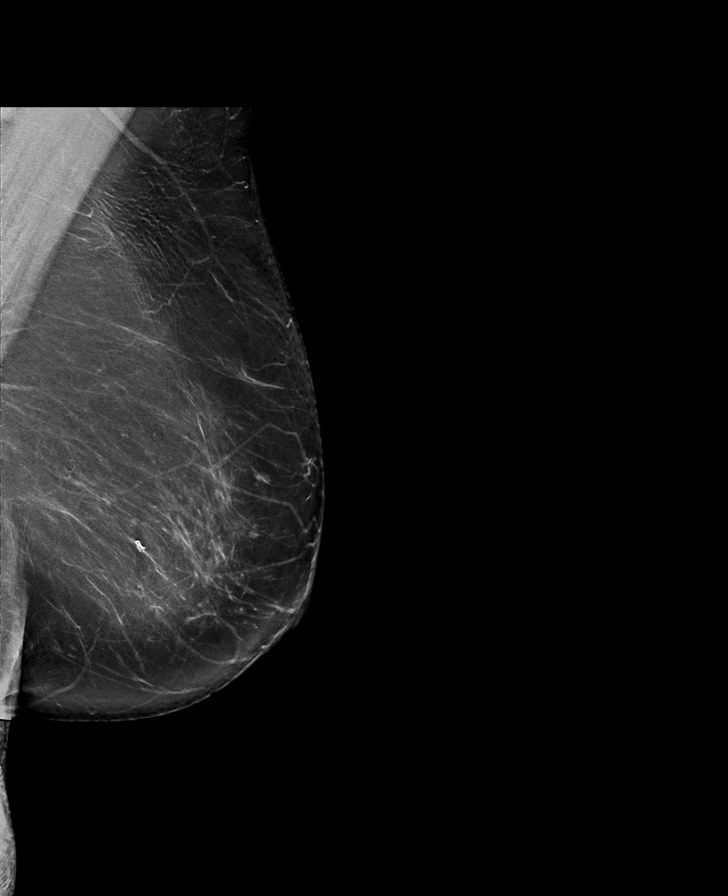

[L CC synth-2D]
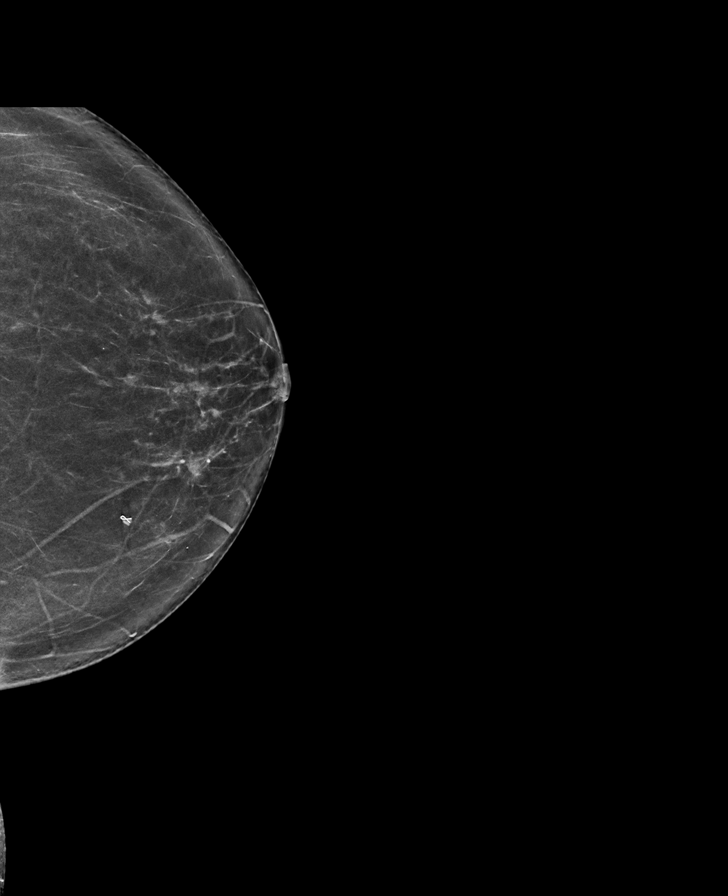

[R CC synth-2D]
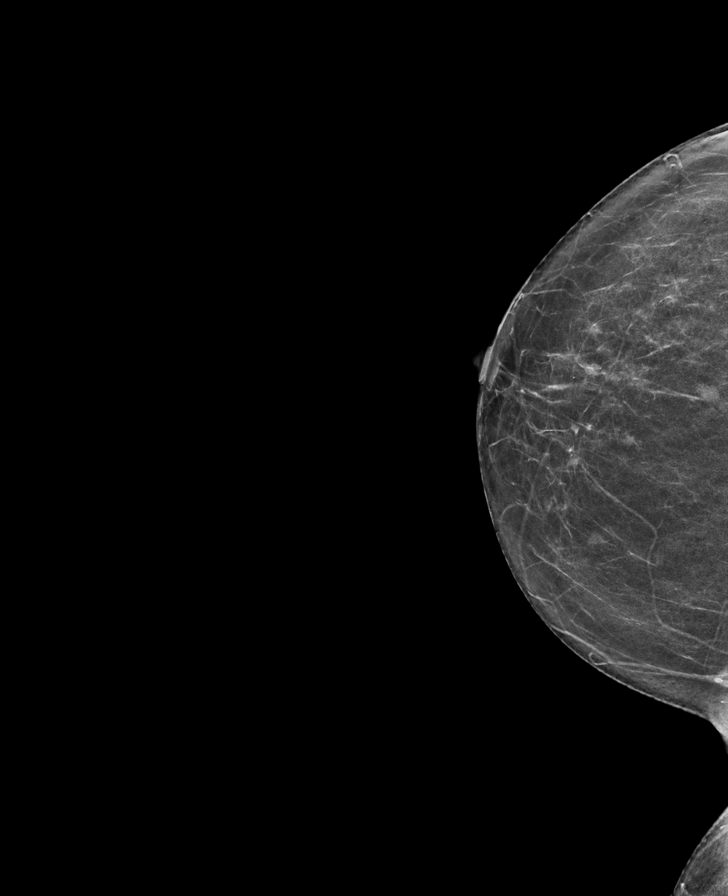

[R MLO tomo · tomo slice 39/77.0]
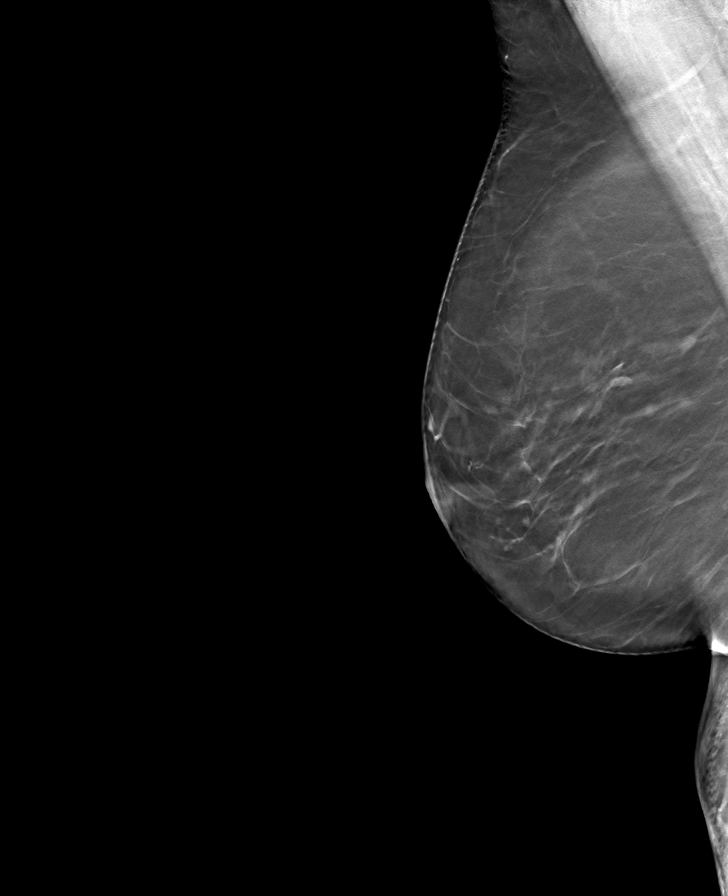

[L MLO tomo · tomo slice 43/86.0]
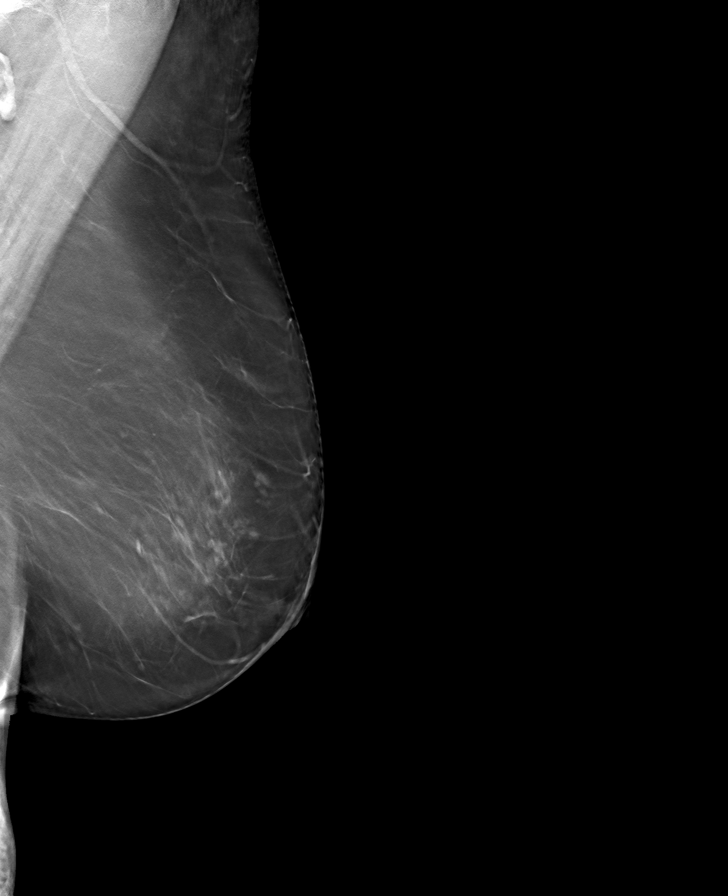

[L CC tomo · tomo slice 35/69.0]
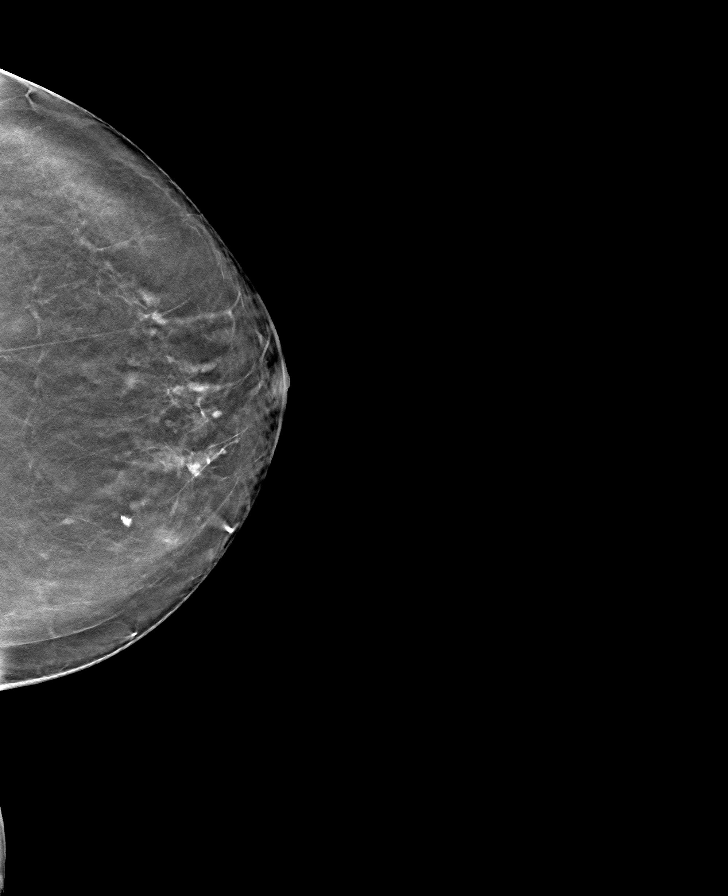

[R CC tomo · tomo slice 33/64.0]
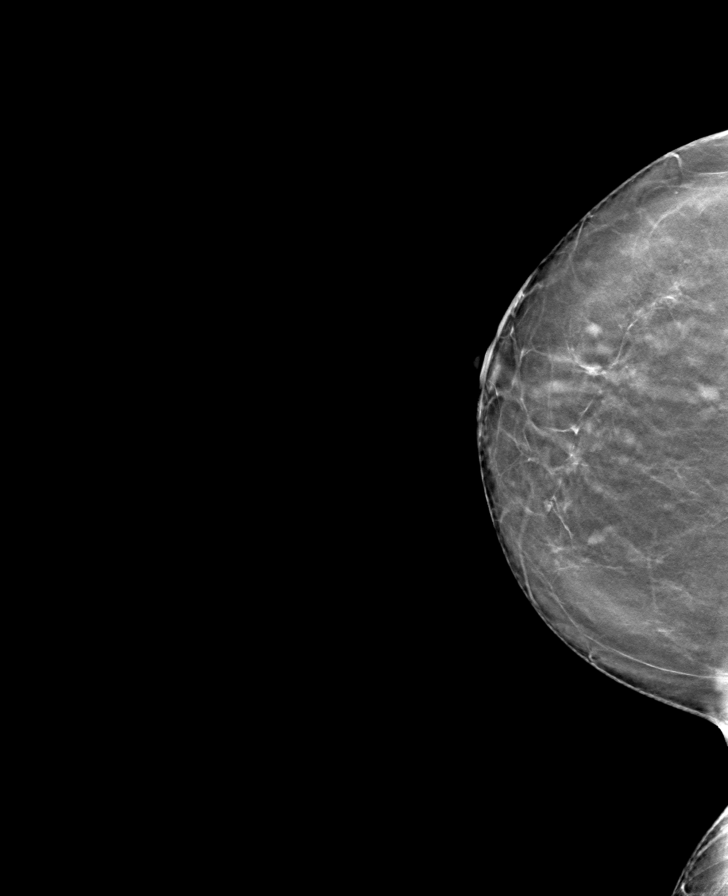

[8 of 24 positions shown; findings below may reference images not displayed]

ACR Breast Density Category b: There are scattered areas of
fibroglandular density.
FINDINGS: In the right breast, a possible mass warrants further evaluation. In
the left breast, no findings suspicious for malignancy. Images were
processed with CAD.
IMPRESSION: Further evaluation is suggested for possible mass in the right
breast.

RECOMMENDATION:
Diagnostic mammogram and possibly ultrasound of the right breast.
(Code:T1-A-550)

The patient will be contacted regarding the findings, and additional
imaging will be scheduled.

BI-RADS CATEGORY  0: Incomplete. Need additional imaging evaluation
and/or prior mammograms for comparison.

## 2020-03-30 ENCOUNTER — Other Ambulatory Visit: Payer: Self-pay | Admitting: Obstetrics and Gynecology

## 2020-03-30 DIAGNOSIS — Z1231 Encounter for screening mammogram for malignant neoplasm of breast: Secondary | ICD-10-CM

## 2020-03-31 ENCOUNTER — Ambulatory Visit
Admission: RE | Admit: 2020-03-31 | Discharge: 2020-03-31 | Disposition: A | Discharge status: Home / Self Care | Payer: PPO | Source: Ambulatory Visit | Attending: Obstetrics and Gynecology | Admitting: Obstetrics and Gynecology

## 2020-03-31 DIAGNOSIS — Z1231 Encounter for screening mammogram for malignant neoplasm of breast: Secondary | ICD-10-CM | POA: Diagnosis not present

## 2020-04-18 DIAGNOSIS — I10 Essential (primary) hypertension: Secondary | ICD-10-CM | POA: Diagnosis not present

## 2020-04-18 DIAGNOSIS — E1129 Type 2 diabetes mellitus with other diabetic kidney complication: Secondary | ICD-10-CM | POA: Diagnosis not present

## 2020-04-30 ENCOUNTER — Other Ambulatory Visit: Payer: Self-pay

## 2020-04-30 ENCOUNTER — Emergency Department (HOSPITAL_COMMUNITY)
Admission: EM | Admit: 2020-04-30 | Discharge: 2020-05-01 | Disposition: A | Discharge status: Home / Self Care | Payer: PPO | Attending: Emergency Medicine | Admitting: Emergency Medicine

## 2020-04-30 ENCOUNTER — Encounter (HOSPITAL_COMMUNITY): Payer: Self-pay | Admitting: *Deleted

## 2020-04-30 DIAGNOSIS — R000 Tachycardia, unspecified: Secondary | ICD-10-CM | POA: Diagnosis not present

## 2020-04-30 DIAGNOSIS — Z794 Long term (current) use of insulin: Secondary | ICD-10-CM | POA: Diagnosis not present

## 2020-04-30 DIAGNOSIS — E119 Type 2 diabetes mellitus without complications: Secondary | ICD-10-CM | POA: Diagnosis not present

## 2020-04-30 DIAGNOSIS — I10 Essential (primary) hypertension: Secondary | ICD-10-CM | POA: Diagnosis not present

## 2020-04-30 DIAGNOSIS — R0600 Dyspnea, unspecified: Secondary | ICD-10-CM | POA: Diagnosis not present

## 2020-04-30 DIAGNOSIS — T59891A Toxic effect of other specified gases, fumes and vapors, accidental (unintentional), initial encounter: Secondary | ICD-10-CM | POA: Diagnosis not present

## 2020-04-30 DIAGNOSIS — T5991XA Toxic effect of unspecified gases, fumes and vapors, accidental (unintentional), initial encounter: Secondary | ICD-10-CM | POA: Insufficient documentation

## 2020-04-30 DIAGNOSIS — T7840XA Allergy, unspecified, initial encounter: Secondary | ICD-10-CM | POA: Diagnosis not present

## 2020-04-30 DIAGNOSIS — R05 Cough: Secondary | ICD-10-CM | POA: Diagnosis present

## 2020-04-30 DIAGNOSIS — R0602 Shortness of breath: Secondary | ICD-10-CM | POA: Diagnosis not present

## 2020-04-30 DIAGNOSIS — R0689 Other abnormalities of breathing: Secondary | ICD-10-CM | POA: Diagnosis not present

## 2020-04-30 NOTE — ED Provider Notes (Signed)
[facility] Provider Note   CSN: [id number] Arrival date & time:        History Chief Complaint  Patient presents with  . Toxic Inhalation    Adrienne [name] is a 69 y.o. female.  HPI 69 year old female presents with acute cough and dyspnea.  She was placing bug bombs in her room and did 6 instead of her typical 3.  Tried to rush out of the room after setting them off but think she was a little too slow and right after leaving the room developed cough and feeling like she could not breathe.  911 was called.  This overall occurred around 1:30 PM.  She feels like her throat is sore/burning after the incident.  She was placed on high flow oxygen by EMS and states she has had significant improvement and now feels just a little bit of congestion in her chest but no dyspnea.  She feels a little fatigued.  Previous to this she was not feeling ill besides a slight dry cough last night.   Past Medical History:  Diagnosis Date  . Arthritis    hands/fingers  . Dental crowns present   . Depression   . Difficulty swallowing solids    due to esophageal stricture  . Eczema   . Esophageal reflux   . Esophageal stricture   . Family history of adverse reaction to anesthesia    pt's mother had hx. of N/V and being hard to wake up post-op  . Generalized anxiety disorder   . Hyperlipidemia   . Hypertension    states under control with med., has been on med. x 30 yr.  . Insulin dependent diabetes mellitus   . Irritable bowel syndrome   . Nutcracker esophagus   . Seasonal allergies   . Trigger finger of right hand 09/2017   index and middle fingers  . Wears hearing aid in both ears     Patient Active Problem List   Diagnosis Date Noted  . Plantar fasciitis of right foot 02/27/2019  . Bilateral hearing loss 06/24/2016  . Rhinitis, chronic 06/24/2016  . Internal hemorrhoids 02/05/2013  . IBS (irritable bowel syndrome) 12/05/2011  . Anxiety disorder 12/05/2011  .  GERD (gastroesophageal reflux disease) 12/05/2011  . IBS 08/16/2010  . DIAB W/UNS COMP TYPE II/UNS NOT STATED UNCNTRL 12/02/2008  . GENERALIZED ANXIETY DISORDER 12/02/2008  . GERD 12/02/2008  . DYSPHAGIA 12/02/2008  . HYPERLIPIDEMIA 01/22/2008  . IRRITABLE BOWEL SYNDROME, HX OF 01/22/2008  . ULCER OF ESOPHAGUS WITHOUT BLEEDING 05/12/2006  . GASTRITIS 05/12/2006  . COLITIS 12/14/2003    Past Surgical History:  Procedure Laterality Date  . CARPAL TUNNEL RELEASE Left 01/07/2014   Procedure: LEFT CARPAL TUNNEL RELEASE;  Surgeon: Tennis Must, MD;  Location: [place];  Service: Orthopedics;  Laterality: Left;  . COLONOSCOPY    . ESOPHAGEAL MANOMETRY  05/28/2006  . EYE SURGERY Right    tear duct repair  . NASAL SINUS SURGERY    . TONSILLECTOMY    . TRIGGER FINGER RELEASE Right 11/06/2017   Procedure: RELEASE TRIGGER FINGER/A-1 PULLEY RIGHT INDEX FINGER AND RIGHT MIDDLE FINGER;  Surgeon: Leanora Cover, MD;  Location: [place];  Service: Orthopedics;  Laterality: Right;  . UPPER GASTROINTESTINAL ENDOSCOPY  10/18/2013   with dilation; with Propofol  . UPPER GASTROINTESTINAL ENDOSCOPY  10/30/2016     OB History   No obstetric history on file.     Family History  Problem Relation  Age of Onset  . Colon cancer Maternal Grandmother   . Stomach cancer Maternal Grandfather   . Prostate cancer Paternal Grandfather   . Diabetes Sister   . Diabetes Father   . CAD Father 63  . AAA (abdominal aortic aneurysm) Father   . Peripheral vascular disease Mother 68  . Anesthesia problems Mother        post-op N/V; hard to wake up post-op  . Inflammatory bowel disease Grandchild   . AAA (abdominal aortic aneurysm) Brother     Social History   Tobacco Use  . Smoking status: Never Smoker  . Smokeless tobacco: Never Used  Vaping Use  . Vaping Use: Never used  Substance Use Topics  . Alcohol use: Yes    Alcohol/week: 0.0 standard drinks    Comment:  occasionally  . Drug use: No    Home Medications Prior to Admission medications   Medication Sig Start Date End Date Taking? Authorizing Provider  ALPRAZolam (XANAX) 0.25 MG tablet TAKE 1 TABLET BY MOUTH ONCE DAILY AS NEEDED FOR ANXIETY OR SLEEP 06/05/18   [provider]  amoxicillin (AMOXIL) 500 MG capsule amoxicillin 500 mg capsule    [provider]  amoxicillin-clavulanate (AUGMENTIN) 875-125 MG tablet amoxicillin 875 mg-potassium clavulanate 125 mg tablet    [provider]  aspirin 81 MG tablet Take 81 mg by mouth daily.    [provider]  Aspirin-Calcium Carbonate 81-777 MG TABS Take by mouth.    [provider]  azelastine (ASTELIN) 0.1 % nasal spray azelastine 137 mcg (0.1 %) nasal spray aerosol    [provider]  benzonatate (TESSALON) 100 MG capsule benzonatate 100 mg capsule    [provider]  calcium carbonate (OS-CAL) 600 MG TABS tablet Take 600 mg by mouth 2 (two) times daily with a meal.    [provider]  Cetirizine HCl 10 MG CAPS Take by mouth.    [provider]  clobetasol cream (TEMOVATE) 0.05 %  10/15/17   [provider]  clobetasol ointment (TEMOVATE) 0.05 % clobetasol 0.05 % topical ointment    [provider]  clonazePAM (KLONOPIN) 0.5 MG tablet clonazepam 0.5 mg tablet    [provider]  Cyanocobalamin (RA VITAMIN B-12 TR) 1000 MCG TBCR Take by mouth.    [provider]  doxycycline (MONODOX) 100 MG capsule doxycycline monohydrate 100 mg capsule    [provider]  Dulaglutide (TRULICITY) 1.5 ME/2.6ST SOPN Inject 1.5 mg into the skin once a week. [facility]    [provider]  Eluxadoline (VIBERZI) 75 MG TABS Take 75 mg by mouth 2 (two) times daily. 11/17/17   Pyrtle, Lajuan Lines, MD  empagliflozin (JARDIANCE) 25 MG TABS tablet Take 25 mg by mouth daily.    [provider]  esomeprazole (NEXIUM) 40 MG capsule Take 40 mg by mouth  daily.     [provider]  Fe Fum-FePoly-FA-Vit C-Vit B3 (INTEGRA F) 125-1 MG CAPS Take by mouth.    [provider]  fluconazole (DIFLUCAN) 150 MG tablet fluconazole 150 mg tablet  TAKE 1 TABLET BY MOUTH ONCE DAILY FOR 7 DAYS    [provider]  fluocinonide cream (LIDEX) 0.05 % fluocinonide 0.05 % topical cream    [provider]  fluticasone (FLONASE) 50 MCG/ACT nasal spray Place into the nose. 06/24/16   [provider]  furosemide (LASIX) 20 MG tablet furosemide 20 mg tablet    [provider]  HYDROcodone-acetaminophen ([place]) 5-325 MG  tablet 1-2 tabs po q6 hours prn pain 11/06/17   Leanora Cover, MD  HYDROcodone-homatropine [facility]) 5-1.5 MG/5ML syrup hydrocodone-homatropine 5 mg-1.5 mg/5 mL oral syrup    [provider]  hydrocortisone (ANUSOL-HC) 25 MG suppository hydrocortisone acetate 25 mg rectal suppository    [provider]  ibuprofen (ADVIL) 600 MG tablet Take 600 mg by mouth every 6 (six) hours as needed. 09/18/19   [provider]  imipramine (TOFRANIL) 25 MG tablet Take by mouth.    [provider]  insulin glargine (LANTUS) 100 UNIT/ML injection Inject 40 Units into the skin daily.     [provider]  isosorbide mononitrate (IMDUR) 30 MG 24 hr tablet Take 15 mg by mouth daily.    [provider]  metFORMIN (GLUCOPHAGE) 1000 MG tablet Take 1,000 mg by mouth 2 (two) times daily with a meal.     [provider]  Multiple Vitamin (MULTIVITAMIN) tablet Take 1 tablet by mouth daily.    [provider]  nystatin (NYSTATIN) powder Nystop 100,000 unit/gram topical powder  USE TO AFFECTED AREAS ON THE SKIN TWICE DAILY AS NEEDED    [provider]  olanzapine-FLUoxetine (SYMBYAX) 6-25 MG per capsule Take 1 capsule by mouth every evening.    [provider]  ramipril (ALTACE) 10 MG tablet Take 10 mg by mouth daily.    [provider]    simvastatin (ZOCOR) 40 MG tablet Take 40 mg by mouth every evening.    [provider]  FLUoxetine (PROZAC) 40 MG capsule Take 40 mg by mouth daily.  12/05/11  [provider]  rosiglitazone-metformin (AVANDAMET) 12-998 MG per tablet Take 1 tablet by mouth 2 (two) times daily with a meal.  12/05/11  [provider]    Allergies    Sulfa antibiotics  Review of Systems   Review of Systems  Constitutional: Negative for fever.  HENT: Positive for sore throat.   Respiratory: Positive for cough and shortness of breath.   All other systems reviewed and are negative.   Physical Exam Updated Vital Signs BP (!) 133/61 (BP Location: Left Arm)   Pulse 97   Temp 98.3 F (36.8 C) (Oral)   Resp 17   Ht 5\' 1"  (1.549 m)   Wt 75.3 kg   SpO2 97%   BMI 31.37 kg/m   Physical Exam Vitals and nursing note reviewed.  Constitutional:      General: She is not in acute distress.    Appearance: She is well-developed. She is not ill-appearing or diaphoretic.  HENT:     Head: Normocephalic and atraumatic.     Right Ear: External ear normal.     Left Ear: External ear normal.     Nose: Nose normal.     Mouth/Throat:     Pharynx: No oropharyngeal exudate or posterior oropharyngeal erythema.  Eyes:     General:        Right eye: No discharge.        Left eye: No discharge.  Cardiovascular:     Rate and Rhythm: Regular rhythm. Tachycardia present.     Heart sounds: Normal heart sounds.     Comments: HR~100 Pulmonary:     Effort: Pulmonary effort is normal.     Breath sounds: Normal breath sounds. No wheezing, rhonchi or rales.  Abdominal:     Palpations: Abdomen is soft.     Tenderness: There is no abdominal tenderness.  Skin:    General: Skin is warm and  dry.  Neurological:     Mental Status: She is alert.  Psychiatric:        Mood and Affect: Mood is not anxious.     ED Results / Procedures / Treatments   Labs (all labs ordered are listed, but only abnormal  results are displayed) Labs Reviewed - No data to display  EKG EKG Interpretation  Date/Time:  Sunday April 30 2020 14:56:30 EDT Ventricular Rate:  106 PR Interval:    QRS Duration: 78 QT Interval:  378 QTC Calculation: 502 R Axis:   63 Text Interpretation: Sinus tachycardia Borderline T wave abnormalities Prolonged QT interval rate is faster compared to Feb 2019 otherwise similar, no acute ST/T changes Confirmed by Sherwood Gambler [phone]) on 04/30/2020 2:59:57 PM   Radiology No results found.  Procedures Procedures (including critical care time)  Medications Ordered in ED Medications - No data to display  ED Course  I have reviewed the triage vital signs and the nursing notes.  Pertinent labs & imaging results that were available during my care of the patient were reviewed by me and considered in my medical decision making (see chart for details).    MDM Rules/Calculators/A&P                          Patient is feeling better.  She was observed in the ED and after an hour she is requesting to go home.  She feels back to baseline.  Likely from inhalation injury that appears to be mild and transient after using the bug bombs.  Given no wheezing, hypoxia, increased work of breathing at this time, I do not think further imaging or work-up is needed.  I offered x-ray though I do not think this would be positive given no current cough or shortness of breath, etc.  She declines.  Discharged home with return precautions. Final Clinical Impression(s) / ED Diagnoses Final diagnoses:  Toxic effect of gas exposure, accidental or unintentional, initial encounter    Rx / DC Orders ED Discharge Orders    None       Sherwood Gambler, MD 04/30/20 1840

## 2020-04-30 NOTE — ED Notes (Signed)
EMS reports they called poison control and that they would follow up with the hospital with any further recommendations.

## 2020-04-30 NOTE — ED Triage Notes (Signed)
Patient presents to the ED by [facility] due to shortness of breath, expiratory wheeze, after setting off 6 bed bug/flea bombs in one room in her home and staying inside for approximately one minute.  Patient improved respiratory status after receiving high flow oxygen by EMS.  Patient 02 remained above 90%.  Patient is alert,verbal no distress upon arrival.

## 2020-05-29 DIAGNOSIS — Z6832 Body mass index (BMI) 32.0-32.9, adult: Secondary | ICD-10-CM | POA: Diagnosis not present

## 2020-05-29 DIAGNOSIS — I209 Angina pectoris, unspecified: Secondary | ICD-10-CM | POA: Diagnosis not present

## 2020-05-29 DIAGNOSIS — Z794 Long term (current) use of insulin: Secondary | ICD-10-CM | POA: Diagnosis not present

## 2020-05-29 DIAGNOSIS — F3130 Bipolar disorder, current episode depressed, mild or moderate severity, unspecified: Secondary | ICD-10-CM | POA: Diagnosis not present

## 2020-05-29 DIAGNOSIS — I517 Cardiomegaly: Secondary | ICD-10-CM | POA: Diagnosis not present

## 2020-05-29 DIAGNOSIS — D692 Other nonthrombocytopenic purpura: Secondary | ICD-10-CM | POA: Diagnosis not present

## 2020-05-29 DIAGNOSIS — E7800 Pure hypercholesterolemia, unspecified: Secondary | ICD-10-CM | POA: Diagnosis not present

## 2020-05-29 DIAGNOSIS — E669 Obesity, unspecified: Secondary | ICD-10-CM | POA: Diagnosis not present

## 2020-05-29 DIAGNOSIS — F418 Other specified anxiety disorders: Secondary | ICD-10-CM | POA: Diagnosis not present

## 2020-05-29 DIAGNOSIS — E119 Type 2 diabetes mellitus without complications: Secondary | ICD-10-CM | POA: Diagnosis not present

## 2020-05-29 DIAGNOSIS — I10 Essential (primary) hypertension: Secondary | ICD-10-CM | POA: Diagnosis not present

## 2020-06-07 DIAGNOSIS — L814 Other melanin hyperpigmentation: Secondary | ICD-10-CM | POA: Diagnosis not present

## 2020-06-07 DIAGNOSIS — L218 Other seborrheic dermatitis: Secondary | ICD-10-CM | POA: Diagnosis not present

## 2020-06-07 DIAGNOSIS — D225 Melanocytic nevi of trunk: Secondary | ICD-10-CM | POA: Diagnosis not present

## 2020-06-07 DIAGNOSIS — L720 Epidermal cyst: Secondary | ICD-10-CM | POA: Diagnosis not present

## 2020-06-07 DIAGNOSIS — L853 Xerosis cutis: Secondary | ICD-10-CM | POA: Diagnosis not present

## 2020-06-07 DIAGNOSIS — Z85828 Personal history of other malignant neoplasm of skin: Secondary | ICD-10-CM | POA: Diagnosis not present

## 2020-06-07 DIAGNOSIS — L821 Other seborrheic keratosis: Secondary | ICD-10-CM | POA: Diagnosis not present

## 2020-07-18 DIAGNOSIS — E119 Type 2 diabetes mellitus without complications: Secondary | ICD-10-CM | POA: Diagnosis not present

## 2020-07-18 DIAGNOSIS — I10 Essential (primary) hypertension: Secondary | ICD-10-CM | POA: Diagnosis not present

## 2020-07-26 DIAGNOSIS — Z01411 Encounter for gynecological examination (general) (routine) with abnormal findings: Secondary | ICD-10-CM | POA: Diagnosis not present

## 2020-07-26 DIAGNOSIS — N814 Uterovaginal prolapse, unspecified: Secondary | ICD-10-CM | POA: Diagnosis not present

## 2020-07-26 DIAGNOSIS — N8110 Cystocele, unspecified: Secondary | ICD-10-CM | POA: Diagnosis not present

## 2020-11-01 DIAGNOSIS — E119 Type 2 diabetes mellitus without complications: Secondary | ICD-10-CM | POA: Diagnosis not present

## 2020-11-01 DIAGNOSIS — Z794 Long term (current) use of insulin: Secondary | ICD-10-CM | POA: Diagnosis not present

## 2020-11-01 DIAGNOSIS — I10 Essential (primary) hypertension: Secondary | ICD-10-CM | POA: Diagnosis not present

## 2020-11-29 DIAGNOSIS — E119 Type 2 diabetes mellitus without complications: Secondary | ICD-10-CM | POA: Diagnosis not present

## 2020-11-29 DIAGNOSIS — M859 Disorder of bone density and structure, unspecified: Secondary | ICD-10-CM | POA: Diagnosis not present

## 2020-11-29 DIAGNOSIS — E7800 Pure hypercholesterolemia, unspecified: Secondary | ICD-10-CM | POA: Diagnosis not present

## 2020-12-06 DIAGNOSIS — F418 Other specified anxiety disorders: Secondary | ICD-10-CM | POA: Diagnosis not present

## 2020-12-06 DIAGNOSIS — I209 Angina pectoris, unspecified: Secondary | ICD-10-CM | POA: Diagnosis not present

## 2020-12-06 DIAGNOSIS — I10 Essential (primary) hypertension: Secondary | ICD-10-CM | POA: Diagnosis not present

## 2020-12-06 DIAGNOSIS — Z1212 Encounter for screening for malignant neoplasm of rectum: Secondary | ICD-10-CM | POA: Diagnosis not present

## 2020-12-06 DIAGNOSIS — I517 Cardiomegaly: Secondary | ICD-10-CM | POA: Diagnosis not present

## 2020-12-06 DIAGNOSIS — D692 Other nonthrombocytopenic purpura: Secondary | ICD-10-CM | POA: Diagnosis not present

## 2020-12-06 DIAGNOSIS — Z1339 Encounter for screening examination for other mental health and behavioral disorders: Secondary | ICD-10-CM | POA: Diagnosis not present

## 2020-12-06 DIAGNOSIS — R82998 Other abnormal findings in urine: Secondary | ICD-10-CM | POA: Diagnosis not present

## 2020-12-06 DIAGNOSIS — Z794 Long term (current) use of insulin: Secondary | ICD-10-CM | POA: Diagnosis not present

## 2020-12-06 DIAGNOSIS — M8580 Other specified disorders of bone density and structure, unspecified site: Secondary | ICD-10-CM | POA: Diagnosis not present

## 2020-12-06 DIAGNOSIS — E119 Type 2 diabetes mellitus without complications: Secondary | ICD-10-CM | POA: Diagnosis not present

## 2020-12-06 DIAGNOSIS — E663 Overweight: Secondary | ICD-10-CM | POA: Diagnosis not present

## 2020-12-06 DIAGNOSIS — Z1331 Encounter for screening for depression: Secondary | ICD-10-CM | POA: Diagnosis not present

## 2020-12-06 DIAGNOSIS — E7800 Pure hypercholesterolemia, unspecified: Secondary | ICD-10-CM | POA: Diagnosis not present

## 2020-12-06 DIAGNOSIS — Z0000 Encounter for general adult medical examination without abnormal findings: Secondary | ICD-10-CM | POA: Diagnosis not present

## 2020-12-06 DIAGNOSIS — F3130 Bipolar disorder, current episode depressed, mild or moderate severity, unspecified: Secondary | ICD-10-CM | POA: Diagnosis not present

## 2021-01-11 ENCOUNTER — Encounter: Payer: Self-pay | Admitting: *Deleted

## 2021-01-23 ENCOUNTER — Encounter: Payer: Self-pay | Admitting: Internal Medicine

## 2021-01-23 ENCOUNTER — Ambulatory Visit: Payer: PPO | Admitting: Internal Medicine

## 2021-01-23 VITALS — BP 130/60 | HR 105 | Ht 61.0 in | Wt 153.0 lb

## 2021-01-23 DIAGNOSIS — K224 Dyskinesia of esophagus: Secondary | ICD-10-CM | POA: Diagnosis not present

## 2021-01-23 DIAGNOSIS — Z1211 Encounter for screening for malignant neoplasm of colon: Secondary | ICD-10-CM | POA: Diagnosis not present

## 2021-01-23 DIAGNOSIS — K219 Gastro-esophageal reflux disease without esophagitis: Secondary | ICD-10-CM

## 2021-01-23 DIAGNOSIS — R1310 Dysphagia, unspecified: Secondary | ICD-10-CM | POA: Diagnosis not present

## 2021-01-23 DIAGNOSIS — K589 Irritable bowel syndrome without diarrhea: Secondary | ICD-10-CM | POA: Diagnosis not present

## 2021-01-23 MED ORDER — PLENVU 140 G PO SOLR: 1.0000 | ORAL | 0 refills | Status: DC

## 2021-01-23 NOTE — Patient Instructions (Addendum)
You have been scheduled for an endoscopy and colonoscopy. Please follow the written instructions given to you at your visit today. Please pick up your prep supplies at the pharmacy within the next 1-3 days. If you use inhalers (even only as needed), please bring them with you on the day of your procedure.  Continue Imdur.  Continue Nexium 20 mg over the counter.   If you are age 71 or older, your body mass index should be between 23-30. Your Body mass index is 28.91 kg/m. If this is out of the aforementioned range listed, please consider follow up with your Primary Care Provider.  Due to recent changes in healthcare laws, you may see the results of your imaging and laboratory studies on MyChart before your provider has had a chance to review them.  We understand that in some cases there may be results that are confusing or concerning to you. Not all laboratory results come back in the same time frame and the provider may be waiting for multiple results in order to interpret others.  Please give [place] 48 hours in order for your provider to thoroughly review all the results before contacting the office for clarification of your results.

## 2021-01-24 ENCOUNTER — Encounter: Payer: Self-pay | Admitting: Internal Medicine

## 2021-01-24 NOTE — Progress Notes (Signed)
Patient ID: Adrienne [name], female   DOB: [birth year], 70 y.o.   MRN: [id number] HPI: [name] is a 70 year old female known to me in the past but not seen since December 2017 with a history of IBS-D, esophageal dysmotility with esophageal spasm, GERD, prior history with gas and bloating who is here for to discuss dysphagia symptom as well as IBS and colon cancer screening.  She is here alone today.  She reports that for the whole her IBS has been doing better but it is certainly not gone.  It is better since she retired.  She will have some days with diarrhea where she feels stool urgency and like she "empties her entire colon".  With this there can be some lower abdominal pain and then a day or so of soreness afterward.  At times the soreness feels like there is gas that she needs to pass.  Stools vary from solid to loose and often times start is solid and finishes loose.  She can skip days without a bowel movement as many as 1-2 and at longest 5 days.  If she goes 5 days without bowel movement she feels uncomfortable.  She does intermittently feel hemorrhoidal symptoms.  From a GERD perspective she continues Nexium 20 mg daily which is definitively helpful for her heartburn.  She has had issues with dysphagia.  She feels like solid food will stick in her esophagus and she has to push this down by drinking liquids.  At other times she has to cough this back up.  There are other times when she will have coughing spells with just liquids.  She remains on Imdur for esophageal spasm which she has taken for years initially prescribed by Dr. Sharlett Iles and she finds helpful.  Past Medical History:  Diagnosis Date  . Arthritis    hands/fingers  . Dental crowns present   . Depression   . Difficulty swallowing solids    due to esophageal stricture  . Eczema   . Esophageal reflux   . Esophageal stricture   . Family history of adverse reaction to anesthesia    pt's mother had hx. of N/V and being hard  to wake up post-op  . Generalized anxiety disorder   . GERD (gastroesophageal reflux disease)   . Hyperlipidemia   . Hypertension    states under control with med., has been on med. x 30 yr.  . Insulin dependent diabetes mellitus   . Irritable bowel syndrome   . Nutcracker esophagus   . Seasonal allergies   . Trigger finger of right hand 09/2017   index and middle fingers  . Wears hearing aid in both ears     Past Surgical History:  Procedure Laterality Date  . CARPAL TUNNEL RELEASE Left 01/07/2014   Procedure: LEFT CARPAL TUNNEL RELEASE;  Surgeon: Tennis Must, MD;  Location: [facility];  Service: Orthopedics;  Laterality: Left;  . COLONOSCOPY    . ESOPHAGEAL MANOMETRY  05/28/2006  . EYE SURGERY Right    tear duct repair  . NASAL SINUS SURGERY    . TONSILLECTOMY    . TRIGGER FINGER RELEASE Right 11/06/2017   Procedure: RELEASE TRIGGER FINGER/A-1 PULLEY RIGHT INDEX FINGER AND RIGHT MIDDLE FINGER;  Surgeon: Leanora Cover, MD;  Location: [facility];  Service: Orthopedics;  Laterality: Right;  . UPPER GASTROINTESTINAL ENDOSCOPY  10/18/2013   with dilation; with Propofol  . UPPER GASTROINTESTINAL ENDOSCOPY  10/30/2016    Outpatient Medications Prior to Visit  Medication Sig Dispense Refill  . ALPRAZolam (XANAX) 0.25 MG tablet TAKE 1 TABLET BY MOUTH ONCE DAILY AS NEEDED FOR ANXIETY OR SLEEP  0  . aspirin 81 MG tablet Take 81 mg by mouth daily.    Marland Kitchen azelastine (ASTELIN) 0.1 % nasal spray azelastine 137 mcg (0.1 %) nasal spray aerosol    . calcium carbonate (OS-CAL) 600 MG TABS tablet Take 600 mg by mouth 2 (two) times daily with a meal.    . Cetirizine HCl 10 MG CAPS Take by mouth.    . Cyanocobalamin 1000 MCG TBCR Take by mouth.    . Dulaglutide 1.5 MG/0.5ML SOPN Inject 1.5 mg into the skin once a week. WEDNESDAY    . empagliflozin (JARDIANCE) 25 MG TABS tablet Take 25 mg by mouth daily.    Marland Kitchen esomeprazole (NEXIUM) 40 MG capsule Take 40 mg by mouth  daily.     . fluconazole (DIFLUCAN) 150 MG tablet fluconazole 150 mg tablet  TAKE 1 TABLET BY MOUTH ONCE DAILY FOR 7 DAYS    . fluticasone (FLONASE) 50 MCG/ACT nasal spray Place into the nose.    . furosemide (LASIX) 20 MG tablet furosemide 20 mg tablet    . ibuprofen (ADVIL) 600 MG tablet Take 600 mg by mouth every 6 (six) hours as needed.    Marland Kitchen imipramine (TOFRANIL) 25 MG tablet Take by mouth.    . insulin glargine (LANTUS) 100 UNIT/ML injection Inject 40 Units into the skin daily.     . isosorbide mononitrate (IMDUR) 30 MG 24 hr tablet Take 15 mg by mouth daily.    . metFORMIN (GLUCOPHAGE) 1000 MG tablet Take 1,000 mg by mouth 2 (two) times daily with a meal.     . Multiple Vitamin (MULTIVITAMIN) tablet Take 1 tablet by mouth daily.    Marland Kitchen olanzapine-FLUoxetine (SYMBYAX) 6-25 MG per capsule Take 1 capsule by mouth every evening.    . ramipril (ALTACE) 10 MG tablet Take 10 mg by mouth daily.    . simvastatin (ZOCOR) 40 MG tablet Take 40 mg by mouth every evening.    Marland Kitchen amoxicillin (AMOXIL) 500 MG capsule amoxicillin 500 mg capsule    . amoxicillin-clavulanate (AUGMENTIN) 875-125 MG tablet amoxicillin 875 mg-potassium clavulanate 125 mg tablet    . Aspirin-Calcium Carbonate 81-777 MG TABS Take by mouth.    . benzonatate (TESSALON) 100 MG capsule benzonatate 100 mg capsule    . clobetasol cream (TEMOVATE) 0.05 %     . clobetasol ointment (TEMOVATE) 0.05 % clobetasol 0.05 % topical ointment    . clonazePAM (KLONOPIN) 0.5 MG tablet clonazepam 0.5 mg tablet    . doxycycline (MONODOX) 100 MG capsule doxycycline monohydrate 100 mg capsule    . Eluxadoline (VIBERZI) 75 MG TABS Take 75 mg by mouth 2 (two) times daily. 180 tablet 3  . Fe Fum-FePoly-FA-Vit C-Vit B3 (INTEGRA F) 125-1 MG CAPS Take by mouth.    . fluocinonide cream (LIDEX) 0.05 % fluocinonide 0.05 % topical cream    . HYDROcodone-acetaminophen ([place]) 5-325 MG tablet 1-2 tabs po q6 hours prn pain 20 tablet 0  . HYDROcodone-homatropine  (HYCODAN) 5-1.5 MG/5ML syrup hydrocodone-homatropine 5 mg-1.5 mg/5 mL oral syrup    . hydrocortisone (ANUSOL-HC) 25 MG suppository hydrocortisone acetate 25 mg rectal suppository    . nystatin (NYSTATIN) powder Nystop 100,000 unit/gram topical powder  USE TO AFFECTED AREAS ON THE SKIN TWICE DAILY AS NEEDED     Facility-Administered Medications Prior to Visit  Medication Dose Route Frequency Provider Last Rate  Last Admin  . dexamethasone (DECADRON) injection 2 mg  2 mg Other Once Marzetta Board, DPM        Allergies  Allergen Reactions  . Sulfa Antibiotics Rash    Family History  Problem Relation Age of Onset  . Colon cancer Maternal Grandmother   . Stomach cancer Maternal Grandfather   . Prostate cancer Paternal Grandfather   . Diabetes Sister   . Diabetes Father   . CAD Father 18  . AAA (abdominal aortic aneurysm) Father   . Peripheral vascular disease Mother 74  . Anesthesia problems Mother        post-op N/V; hard to wake up post-op  . Inflammatory bowel disease Grandchild   . AAA (abdominal aortic aneurysm) Brother     Social History   Tobacco Use  . Smoking status: Never Smoker  . Smokeless tobacco: Never Used  Vaping Use  . Vaping Use: Never used  Substance Use Topics  . Alcohol use: Yes    Alcohol/week: 0.0 standard drinks    Comment: occasionally  . Drug use: No    ROS: As per history of present illness, otherwise negative  BP 130/60   Pulse (!) 105   Ht 5\' 1"  (1.549 m)   Wt 153 lb (69.4 kg)   BMI 28.91 kg/m  Gen: awake, alert, NAD HEENT: anicteric, op clear CV: RRR, no mrg Pulm: CTA b/l Abd: soft, NT/ND, +BS throughout Ext: no c/c/e Neuro: nonfocal  ASSESSMENT/PLAN: 70 year old female known to me in the past but not seen since December 2017 with a history of IBS-D, esophageal dysmotility with esophageal spasm, GERD, prior history with gas and bloating who is here for to discuss dysphagia symptom as well as IBS and colon cancer  screening.  1.  GERD/history of esophageal spasm/dysphagia --reflux is well controlled with Nexium.  Upper endoscopy in January 2018 revealed patchy candidiasis.  Empiric esophageal dilation with a Savary was performed at 17 mm.  There was patchy gastritis which biopsied H. pylori negative.  We will proceed as follows: -- Continue Nexium 20 mg daily -- Continue Imdur at current dose -- Upper endoscopy in the [place] with evaluation of dysphagia and probable dilation -- If coughing continues despite dilation then we will consider speech and swallow therapy evaluation for oropharyngeal dysphagia and aspiration  2.  IBS with loose stool --previously on Viberzi.  This was stopped sometime ago.  Overall her IBS symptoms are better.  If symptoms become more significant would consider SIBO breath testing to rule out this concomitant issue.  3.  CRC screening --last colonoscopy was in 2011.  No family history of colon cancer.  I recommend we proceed with screening colonoscopy at this time.  We reviewed the risk, benefits and alternatives to both upper and lower endoscopy she is agreeable and wishes to proceed      GH:[id number], Fransico Him, [address] [place],  [place] [address]

## 2021-01-30 DIAGNOSIS — I10 Essential (primary) hypertension: Secondary | ICD-10-CM | POA: Diagnosis not present

## 2021-01-30 DIAGNOSIS — Z794 Long term (current) use of insulin: Secondary | ICD-10-CM | POA: Diagnosis not present

## 2021-01-30 DIAGNOSIS — E119 Type 2 diabetes mellitus without complications: Secondary | ICD-10-CM | POA: Diagnosis not present

## 2021-04-03 ENCOUNTER — Other Ambulatory Visit: Payer: Self-pay

## 2021-04-03 ENCOUNTER — Encounter: Payer: Self-pay | Admitting: Internal Medicine

## 2021-04-03 ENCOUNTER — Ambulatory Visit (AMBULATORY_SURGERY_CENTER): Payer: PPO | Admitting: Internal Medicine

## 2021-04-03 VITALS — BP 107/53 | HR 78 | Temp 98.0°F | Resp 10 | Ht 61.0 in | Wt 153.0 lb

## 2021-04-03 DIAGNOSIS — R1310 Dysphagia, unspecified: Secondary | ICD-10-CM

## 2021-04-03 DIAGNOSIS — D124 Benign neoplasm of descending colon: Secondary | ICD-10-CM | POA: Diagnosis not present

## 2021-04-03 DIAGNOSIS — Z1211 Encounter for screening for malignant neoplasm of colon: Secondary | ICD-10-CM | POA: Diagnosis not present

## 2021-04-03 DIAGNOSIS — K219 Gastro-esophageal reflux disease without esophagitis: Secondary | ICD-10-CM

## 2021-04-03 MED ORDER — SODIUM CHLORIDE 0.9 % IV SOLN: 500.0000 mL | Freq: Once | INTRAVENOUS | Status: DC

## 2021-04-03 NOTE — Op Note (Signed)
[place] Patient Name: Zaraya Delauder Procedure Date: 04/03/2021 2:24 PM MRN: [id number] Endoscopist: Jerene Bears , MD Age: 70 Referring MD:  Date of Birth: May 16, 1951 Gender: Female Account #: [id number] Procedure:                Upper GI endoscopy Indications:              Dysphagia responsive to previous dilation (2015 and                            2018), Gastro-esophageal reflux disease, history of                            esophageal spasm Medicines:                Monitored Anesthesia Care Procedure:                Pre-Anesthesia Assessment:                           - Prior to the procedure, a History and Physical                            was performed, and patient medications and                            allergies were reviewed. The patient's tolerance of                            previous anesthesia was also reviewed. The risks                            and benefits of the procedure and the sedation                            options and risks were discussed with the patient.                            All questions were answered, and informed consent                            was obtained. Prior Anticoagulants: The patient has                            taken no previous anticoagulant or antiplatelet                            agents. ASA Grade Assessment: II - A patient with                            mild systemic disease. After reviewing the risks                            and benefits, the patient was deemed in  satisfactory condition to undergo the procedure.                           After obtaining informed consent, the endoscope was                            passed under direct vision. Throughout the                            procedure, the patient's blood pressure, pulse, and                            oxygen saturations were monitored continuously. The                            Endoscope was introduced through the  mouth, and                            advanced to the second part of duodenum. The upper                            GI endoscopy was accomplished without difficulty.                            The patient tolerated the procedure well. Scope In: Scope Out: Findings:                 The examined esophagus was normal.                           No endoscopic abnormality was evident in the                            esophagus to explain the patient's complaint of                            dysphagia. It was decided, however, to proceed with                            dilation of the entire esophagus. The scope was                            withdrawn. Dilation was performed with a Maloney                            dilator with moderate resistance at 52 Fr.                           The entire examined stomach was normal.                           The examined duodenum was normal. Complications:            No immediate complications. Estimated Blood Loss:     Estimated blood loss: none. Impression:               -  Normal esophagus.                           - No endoscopic esophageal abnormality to explain                            patient's dysphagia. Esophagus dilated with 52 Fr                            Maloney.                           - Normal stomach.                           - Normal examined duodenum.                           - No specimens collected. Recommendation:           - Patient has a contact number available for                            emergencies. The signs and symptoms of potential                            delayed complications were discussed with the                            patient. Return to normal activities tomorrow.                            Written discharge instructions were provided to the                            patient.                           - Resume previous diet after post-dilation protocol.                           - Continue present  medications.                           - If trouble swallowing persists after dilation                            please call my office. Jerene Bears, MD 04/03/2021 3:00:49 PM This report has been signed electronically.

## 2021-04-03 NOTE — Progress Notes (Signed)
A/ox3, pleased with MAC, report to RN 

## 2021-04-03 NOTE — Op Note (Signed)
[place] Patient Name: Adrienne [name] Procedure Date: 04/03/2021 2:24 PM MRN: [id number] Endoscopist: Jerene Bears , MD Age: 70 Referring MD:  Date of Birth: 02/15/1951 Gender: Female Account #: [id number] Procedure:                Colonoscopy Indications:              Screening for colorectal malignant neoplasm, Last                            colonoscopy: 2011 Medicines:                Monitored Anesthesia Care Procedure:                Pre-Anesthesia Assessment:                           - Prior to the procedure, a History and Physical                            was performed, and patient medications and                            allergies were reviewed. The patient's tolerance of                            previous anesthesia was also reviewed. The risks                            and benefits of the procedure and the sedation                            options and risks were discussed with the patient.                            All questions were answered, and informed consent                            was obtained. Prior Anticoagulants: The patient has                            taken no previous anticoagulant or antiplatelet                            agents. ASA Grade Assessment: II - A patient with                            mild systemic disease. After reviewing the risks                            and benefits, the patient was deemed in                            satisfactory condition to undergo the procedure.  After obtaining informed consent, the colonoscope                            was passed under direct vision. Throughout the                            procedure, the patient's blood pressure, pulse, and                            oxygen saturations were monitored continuously. The                            Olympus PFC-[id number] (#[id number]) Colonoscope was                            introduced through the anus and advanced to the                             cecum, identified by appendiceal orifice and                            ileocecal valve. The colonoscopy was performed                            without difficulty. The patient tolerated the                            procedure well. The quality of the bowel                            preparation was good. The ileocecal valve,                            appendiceal orifice, and rectum were photographed. Scope In: 2:39:52 PM Scope Out: 2:53:49 PM Scope Withdrawal Time: 0 hours 8 minutes 53 seconds  Total Procedure Duration: 0 hours 13 minutes 57 seconds  Findings:                 The digital rectal exam was normal.                           A 3 mm polyp was found in the descending colon. The                            polyp was sessile. The polyp was removed with a                            cold snare. Resection and retrieval were complete.                           Retroflexion in the rectum was not performed due to                            narrow rectal vault.  The exam was otherwise without abnormality. Complications:            No immediate complications. Estimated Blood Loss:     Estimated blood loss: none. Impression:               - One 3 mm polyp in the descending colon, removed                            with a cold snare. Resected and retrieved.                           - The examination was otherwise normal. Recommendation:           - Patient has a contact number available for                            emergencies. The signs and symptoms of potential                            delayed complications were discussed with the                            patient. Return to normal activities tomorrow.                            Written discharge instructions were provided to the                            patient.                           - Resume previous diet.                           - Continue present medications.                            - Await pathology results.                           - No recommendation at this time regarding repeat                            colonoscopy due to age at next screening interval. Jerene Bears, MD 04/03/2021 3:03:23 PM This report has been signed electronically.

## 2021-04-03 NOTE — Patient Instructions (Signed)
YOU HAD AN ENDOSCOPIC PROCEDURE TODAY AT THE [facility]:   Refer to the procedure report that was given to you for any specific questions about what was found during the examination.  If the procedure report does not answer your questions, please call your gastroenterologist to clarify.  If you requested that your care partner not be given the details of your procedure findings, then the procedure report has been included in a sealed envelope for you to review at your convenience later.  YOU SHOULD EXPECT: Some feelings of bloating in the abdomen. Passage of more gas than usual.  Walking can help get rid of the air that was put into your GI tract during the procedure and reduce the bloating. If you had a lower endoscopy (such as a colonoscopy or flexible sigmoidoscopy) you may notice spotting of blood in your stool or on the toilet paper. If you underwent a bowel prep for your procedure, you may not have a normal bowel movement for a few days.  Please Note:  You might notice some irritation and congestion in your nose or some drainage.  This is from the oxygen used during your procedure.  There is no need for concern and it should clear up in a day or so.  SYMPTOMS TO REPORT IMMEDIATELY:   Following lower endoscopy (colonoscopy or flexible sigmoidoscopy):  Excessive amounts of blood in the stool  Significant tenderness or worsening of abdominal pains  Swelling of the abdomen that is new, acute  Fever of 100F or higher   Following upper endoscopy (EGD)  Vomiting of blood or coffee ground material  New chest pain or pain under the shoulder blades  Painful or persistently difficult swallowing  New shortness of breath  Fever of 100F or higher  Black, tarry-looking stools  For urgent or emergent issues, a gastroenterologist can be reached at any hour by calling [phone]. Do not use MyChart messaging for urgent concerns.    DIET:  We do recommend a small meal at first, but  then you may proceed to your regular diet.  Drink plenty of fluids but you should avoid alcoholic beverages for 24 hours.  ACTIVITY:  You should plan to take it easy for the rest of today and you should NOT DRIVE or use heavy machinery until tomorrow (because of the sedation medicines used during the test).    FOLLOW UP: Our staff will call the number listed on your records 48-72 hours following your procedure to check on you and address any questions or concerns that you may have regarding the information given to you following your procedure. If we do not reach you, we will leave a message.  We will attempt to reach you two times.  During this call, we will ask if you have developed any symptoms of COVID 19. If you develop any symptoms (ie: fever, flu-like symptoms, shortness of breath, cough etc.) before then, please call [phone].  If you test positive for Covid 19 in the 2 weeks post procedure, please call and report this information to us.    If any biopsies were taken you will be contacted by phone or by letter within the next 1-3 weeks.  Please call us at [phone] if you have not heard about the biopsies in 3 weeks.    SIGNATURES/CONFIDENTIALITY: You and/or your care partner have signed paperwork which will be entered into your electronic medical record.  These signatures attest to the fact that that the information above on   your After Visit Summary has been reviewed and is understood.  Full responsibility of the confidentiality of this discharge information lies with you and/or your care-partner. 

## 2021-04-05 ENCOUNTER — Telehealth: Payer: Self-pay

## 2021-04-05 NOTE — Telephone Encounter (Signed)
  Follow up Call-  Call back number 04/03/2021  Post procedure Call Back phone  # [phone]  Permission to leave phone message Yes  Some recent data might be hidden     Patient questions:  Do you have a fever, pain , or abdominal swelling? No. Pain Score  0 *  Have you tolerated food without any problems? Yes.    Have you been able to return to your normal activities? Yes.    Do you have any questions about your discharge instructions: Diet   No. Medications  No. Follow up visit  No.  Do you have questions or concerns about your Care? No.  Actions: * If pain score is 4 or above: No action needed, pain <4.

## 2021-04-11 ENCOUNTER — Encounter: Payer: Self-pay | Admitting: Internal Medicine

## 2021-05-02 DIAGNOSIS — E119 Type 2 diabetes mellitus without complications: Secondary | ICD-10-CM | POA: Diagnosis not present

## 2021-05-02 DIAGNOSIS — E7800 Pure hypercholesterolemia, unspecified: Secondary | ICD-10-CM | POA: Diagnosis not present

## 2021-05-02 DIAGNOSIS — Z794 Long term (current) use of insulin: Secondary | ICD-10-CM | POA: Diagnosis not present

## 2021-05-02 DIAGNOSIS — I10 Essential (primary) hypertension: Secondary | ICD-10-CM | POA: Diagnosis not present

## 2021-05-17 ENCOUNTER — Other Ambulatory Visit: Payer: Self-pay | Admitting: Obstetrics and Gynecology

## 2021-05-17 DIAGNOSIS — Z1231 Encounter for screening mammogram for malignant neoplasm of breast: Secondary | ICD-10-CM

## 2021-05-22 ENCOUNTER — Ambulatory Visit
Admission: RE | Admit: 2021-05-22 | Discharge: 2021-05-22 | Disposition: A | Discharge status: Home / Self Care | Payer: PPO | Source: Ambulatory Visit | Attending: Obstetrics and Gynecology | Admitting: Obstetrics and Gynecology

## 2021-05-22 ENCOUNTER — Other Ambulatory Visit: Payer: Self-pay

## 2021-05-22 DIAGNOSIS — Z1231 Encounter for screening mammogram for malignant neoplasm of breast: Secondary | ICD-10-CM | POA: Diagnosis not present

## 2021-06-07 DIAGNOSIS — L218 Other seborrheic dermatitis: Secondary | ICD-10-CM | POA: Diagnosis not present

## 2021-06-07 DIAGNOSIS — L814 Other melanin hyperpigmentation: Secondary | ICD-10-CM | POA: Diagnosis not present

## 2021-06-07 DIAGNOSIS — D1801 Hemangioma of skin and subcutaneous tissue: Secondary | ICD-10-CM | POA: Diagnosis not present

## 2021-06-07 DIAGNOSIS — L821 Other seborrheic keratosis: Secondary | ICD-10-CM | POA: Diagnosis not present

## 2021-06-07 DIAGNOSIS — L820 Inflamed seborrheic keratosis: Secondary | ICD-10-CM | POA: Diagnosis not present

## 2021-06-07 DIAGNOSIS — Z85828 Personal history of other malignant neoplasm of skin: Secondary | ICD-10-CM | POA: Diagnosis not present

## 2021-06-14 DIAGNOSIS — M8580 Other specified disorders of bone density and structure, unspecified site: Secondary | ICD-10-CM | POA: Diagnosis not present

## 2021-06-14 DIAGNOSIS — Z794 Long term (current) use of insulin: Secondary | ICD-10-CM | POA: Diagnosis not present

## 2021-06-14 DIAGNOSIS — F418 Other specified anxiety disorders: Secondary | ICD-10-CM | POA: Diagnosis not present

## 2021-06-14 DIAGNOSIS — E119 Type 2 diabetes mellitus without complications: Secondary | ICD-10-CM | POA: Diagnosis not present

## 2021-06-14 DIAGNOSIS — E663 Overweight: Secondary | ICD-10-CM | POA: Diagnosis not present

## 2021-06-14 DIAGNOSIS — D692 Other nonthrombocytopenic purpura: Secondary | ICD-10-CM | POA: Diagnosis not present

## 2021-06-14 DIAGNOSIS — Z23 Encounter for immunization: Secondary | ICD-10-CM | POA: Diagnosis not present

## 2021-06-14 DIAGNOSIS — F3130 Bipolar disorder, current episode depressed, mild or moderate severity, unspecified: Secondary | ICD-10-CM | POA: Diagnosis not present

## 2021-06-14 DIAGNOSIS — I209 Angina pectoris, unspecified: Secondary | ICD-10-CM | POA: Diagnosis not present

## 2021-06-14 DIAGNOSIS — I10 Essential (primary) hypertension: Secondary | ICD-10-CM | POA: Diagnosis not present

## 2021-06-14 DIAGNOSIS — I517 Cardiomegaly: Secondary | ICD-10-CM | POA: Diagnosis not present

## 2021-06-14 DIAGNOSIS — E7800 Pure hypercholesterolemia, unspecified: Secondary | ICD-10-CM | POA: Diagnosis not present

## 2021-08-02 DIAGNOSIS — E119 Type 2 diabetes mellitus without complications: Secondary | ICD-10-CM | POA: Diagnosis not present

## 2021-08-02 DIAGNOSIS — Z794 Long term (current) use of insulin: Secondary | ICD-10-CM | POA: Diagnosis not present

## 2021-08-02 DIAGNOSIS — I10 Essential (primary) hypertension: Secondary | ICD-10-CM | POA: Diagnosis not present

## 2021-08-02 DIAGNOSIS — Z23 Encounter for immunization: Secondary | ICD-10-CM | POA: Diagnosis not present

## 2021-08-02 DIAGNOSIS — E7800 Pure hypercholesterolemia, unspecified: Secondary | ICD-10-CM | POA: Diagnosis not present

## 2021-10-16 DIAGNOSIS — Z01411 Encounter for gynecological examination (general) (routine) with abnormal findings: Secondary | ICD-10-CM | POA: Diagnosis not present

## 2021-11-01 DIAGNOSIS — E119 Type 2 diabetes mellitus without complications: Secondary | ICD-10-CM | POA: Diagnosis not present

## 2021-11-01 DIAGNOSIS — I10 Essential (primary) hypertension: Secondary | ICD-10-CM | POA: Diagnosis not present

## 2021-11-01 DIAGNOSIS — Z794 Long term (current) use of insulin: Secondary | ICD-10-CM | POA: Diagnosis not present

## 2021-11-01 DIAGNOSIS — E7800 Pure hypercholesterolemia, unspecified: Secondary | ICD-10-CM | POA: Diagnosis not present

## 2021-12-14 DIAGNOSIS — I10 Essential (primary) hypertension: Secondary | ICD-10-CM | POA: Diagnosis not present

## 2021-12-14 DIAGNOSIS — E119 Type 2 diabetes mellitus without complications: Secondary | ICD-10-CM | POA: Diagnosis not present

## 2021-12-14 DIAGNOSIS — E7800 Pure hypercholesterolemia, unspecified: Secondary | ICD-10-CM | POA: Diagnosis not present

## 2021-12-14 DIAGNOSIS — M859 Disorder of bone density and structure, unspecified: Secondary | ICD-10-CM | POA: Diagnosis not present

## 2021-12-21 DIAGNOSIS — D692 Other nonthrombocytopenic purpura: Secondary | ICD-10-CM | POA: Diagnosis not present

## 2021-12-21 DIAGNOSIS — F3130 Bipolar disorder, current episode depressed, mild or moderate severity, unspecified: Secondary | ICD-10-CM | POA: Diagnosis not present

## 2021-12-21 DIAGNOSIS — Z1331 Encounter for screening for depression: Secondary | ICD-10-CM | POA: Diagnosis not present

## 2021-12-21 DIAGNOSIS — Z1212 Encounter for screening for malignant neoplasm of rectum: Secondary | ICD-10-CM | POA: Diagnosis not present

## 2021-12-21 DIAGNOSIS — M8580 Other specified disorders of bone density and structure, unspecified site: Secondary | ICD-10-CM | POA: Diagnosis not present

## 2021-12-21 DIAGNOSIS — E119 Type 2 diabetes mellitus without complications: Secondary | ICD-10-CM | POA: Diagnosis not present

## 2021-12-21 DIAGNOSIS — Z794 Long term (current) use of insulin: Secondary | ICD-10-CM | POA: Diagnosis not present

## 2021-12-21 DIAGNOSIS — E663 Overweight: Secondary | ICD-10-CM | POA: Diagnosis not present

## 2021-12-21 DIAGNOSIS — Z0000 Encounter for general adult medical examination without abnormal findings: Secondary | ICD-10-CM | POA: Diagnosis not present

## 2021-12-21 DIAGNOSIS — R82998 Other abnormal findings in urine: Secondary | ICD-10-CM | POA: Diagnosis not present

## 2021-12-21 DIAGNOSIS — I10 Essential (primary) hypertension: Secondary | ICD-10-CM | POA: Diagnosis not present

## 2021-12-21 DIAGNOSIS — E7800 Pure hypercholesterolemia, unspecified: Secondary | ICD-10-CM | POA: Diagnosis not present

## 2021-12-21 DIAGNOSIS — Z1339 Encounter for screening examination for other mental health and behavioral disorders: Secondary | ICD-10-CM | POA: Diagnosis not present

## 2021-12-31 DIAGNOSIS — M8589 Other specified disorders of bone density and structure, multiple sites: Secondary | ICD-10-CM | POA: Diagnosis not present

## 2022-01-31 DIAGNOSIS — M170 Bilateral primary osteoarthritis of knee: Secondary | ICD-10-CM | POA: Diagnosis not present

## 2022-01-31 DIAGNOSIS — M7062 Trochanteric bursitis, left hip: Secondary | ICD-10-CM | POA: Diagnosis not present

## 2022-02-06 DIAGNOSIS — G8929 Other chronic pain: Secondary | ICD-10-CM | POA: Diagnosis not present

## 2022-02-06 DIAGNOSIS — M545 Low back pain, unspecified: Secondary | ICD-10-CM | POA: Diagnosis not present

## 2022-02-06 DIAGNOSIS — M542 Cervicalgia: Secondary | ICD-10-CM | POA: Diagnosis not present

## 2022-02-07 DIAGNOSIS — E7800 Pure hypercholesterolemia, unspecified: Secondary | ICD-10-CM | POA: Diagnosis not present

## 2022-02-07 DIAGNOSIS — I10 Essential (primary) hypertension: Secondary | ICD-10-CM | POA: Diagnosis not present

## 2022-02-07 DIAGNOSIS — E119 Type 2 diabetes mellitus without complications: Secondary | ICD-10-CM | POA: Diagnosis not present

## 2022-02-07 DIAGNOSIS — Z794 Long term (current) use of insulin: Secondary | ICD-10-CM | POA: Diagnosis not present

## 2022-02-18 DIAGNOSIS — E119 Type 2 diabetes mellitus without complications: Secondary | ICD-10-CM | POA: Diagnosis not present

## 2022-02-19 ENCOUNTER — Ambulatory Visit: Payer: PPO | Attending: Neurological Surgery

## 2022-02-19 DIAGNOSIS — M5459 Other low back pain: Secondary | ICD-10-CM | POA: Diagnosis not present

## 2022-02-19 DIAGNOSIS — M542 Cervicalgia: Secondary | ICD-10-CM | POA: Insufficient documentation

## 2022-02-19 DIAGNOSIS — E119 Type 2 diabetes mellitus without complications: Secondary | ICD-10-CM | POA: Diagnosis not present

## 2022-02-19 NOTE — Therapy (Signed)
[facility] [place], [place], [address] Phone: [phone]   Fax:  [phone]  Physical Therapy Evaluation  Patient Details  Name: Adrienne [name] MRN: [id number] Date of Birth: 1951-01-25 Referring Provider (PT): Ellene Route, MD   Encounter Date: 02/19/2022   PT End of Session - 02/19/22 0903     Visit Number 1    Number of Visits 12    Date for PT Re-Evaluation 04/26/22    PT Start Time 0903    PT Stop Time 0949    PT Time Calculation (min) 46 min    Activity Tolerance Patient tolerated treatment well    Behavior During Therapy [facility] for tasks assessed/performed             Past Medical History:  Diagnosis Date   Allergy    Arthritis    hands/fingers   Dental crowns present    Depression    Difficulty swallowing solids    due to esophageal stricture   Eczema    Esophageal reflux    Esophageal stricture    Family history of adverse reaction to anesthesia    pt's mother had hx. of N/V and being hard to wake up post-op   Generalized anxiety disorder    GERD (gastroesophageal reflux disease)    Hyperlipidemia    Hypertension    states under control with med., has been on med. x 30 yr.   Insulin dependent diabetes mellitus    Irritable bowel syndrome    Nutcracker esophagus    Seasonal allergies    Trigger finger of right hand 09/2017   index and middle fingers   Wears hearing aid in both ears     Past Surgical History:  Procedure Laterality Date   CARPAL TUNNEL RELEASE Left 01/07/2014   Procedure: LEFT CARPAL TUNNEL RELEASE;  Surgeon: Tennis Must, MD;  Location: [facility];  Service: Orthopedics;  Laterality: Left;   COLONOSCOPY     ESOPHAGEAL MANOMETRY  05/28/2006   EYE SURGERY Right    tear duct repair   NASAL SINUS SURGERY     TONSILLECTOMY     TRIGGER FINGER RELEASE Right 11/06/2017   Procedure: RELEASE TRIGGER FINGER/A-1 PULLEY RIGHT INDEX FINGER AND RIGHT MIDDLE FINGER;   Surgeon: Leanora Cover, MD;  Location: [facility];  Service: Orthopedics;  Laterality: Right;   UPPER GASTROINTESTINAL ENDOSCOPY  10/18/2013   with dilation; with Propofol   UPPER GASTROINTESTINAL ENDOSCOPY  10/30/2016    There were no vitals filed for this visit.    Subjective Assessment - 02/19/22 0903     Subjective Patient reports that her neck and back have been bothering her for years, but her pain has progressively gotten worse. She feels that her back bothers her more than the neck. She notes that the front of her legs around her hips can get numb when walking for prolonged periods, but this can go away with her changing positions.    Pertinent History hip bursitis bilaterally (injections helped)    Limitations Standing;Walking;House hold activities;Lifting    How long can you stand comfortably? 20-30 minutes    Patient Stated Goals reduced pain, stand longer, clean    Currently in Pain? Yes    Pain Score 8     Pain Location Back    Pain Orientation Lower;Right;Left    Pain Descriptors / Indicators Sore;Sharp    Pain Type Chronic pain    Pain Radiating Towards none    Pain  Onset More than a month ago    Pain Frequency Intermittent    Aggravating Factors  sweeping, washing dishes, cleaning, laying supine, walking    Pain Relieving Factors sitting    Effect of Pain on Daily Activities she has to modify and limit her activities to prevent from aggravating her symptoms    Multiple Pain Sites Yes    Pain Score 5    Pain Location Neck    Pain Descriptors / Indicators Aching;Tightness    Pain Type Chronic pain    Pain Radiating Towards none    Pain Onset More than a month ago    Pain Frequency Intermittent    Aggravating Factors  lifting    Pain Relieving Factors rest    Effect of Pain on Daily Activities limits her ability to lift and clean                [facility] PT Assessment - 02/19/22 0001       Assessment   Medical Diagnosis Other chronic pain     Referring Provider (PT) Elsner, MD    Onset Date/Surgical Date --   years ago   Next MD Visit as needed    Prior Therapy No      Precautions   Precautions None      Restrictions   Weight Bearing Restrictions No      Balance Screen   Has the patient fallen in the past 6 months Yes    How many times? 1   slipped walking down a hill in her yard, bothered her knees a back; has noticed balance issues in recent years   Has the patient had a decrease in activity level because of a fear of falling?  No    Is the patient reluctant to leave their home because of a fear of falling?  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[employer] [address]   difficult due to back and hip pain; step to pattern   Alternate Level Stairs-Rails Left      Prior Function   Level of Independence Independent    Vocation Retired    Leisure reading, walking (limibed due to back and hip pain)      Cognition   Overall Cognitive Status Within Functional Limits for tasks assessed    Attention Focused    Focused Attention Appears intact    Memory Appears intact    Awareness Appears intact    Problem Solving Appears intact      Sensation   Additional Comments Patient reports intermittent numbness along anterior thigh when walking      ROM / Strength   AROM / PROM / Strength Strength;AROM      AROM   AROM Assessment Site Lumbar    Lumbar Flexion 70   "stretching"   Lumbar Extension 14   familiar pain   Lumbar - Right Side Bend 50% limited   "stiff"   Lumbar - Left Side Bend 50% limited   "stiff"   Lumbar - Right Rotation 25% limited    Lumbar - Left Rotation 50% limited   "stiff"     Strength   Strength Assessment Site Hip;Knee;Ankle;Shoulder;Elbow    Right/Left Shoulder Right;Left    Right Shoulder Flexion 4-/5    Left Shoulder Flexion 4-/5  Right/Left Elbow Right;Left    Right Elbow Flexion 3+/5    Right Elbow Extension 4-/5    Left Elbow Flexion 4-/5    Left Elbow Extension 3+/5    Right/Left Hip Right;Left    Right Hip Flexion 3+/5    Left Hip Flexion 3+/5    Right/Left Knee Right;Left    Right Knee Flexion 3+/5    Right Knee Extension 3+/5    Left Knee Flexion 4-/5   knee pain   Left Knee Extension 3+/5    Right/Left Ankle Right;Left    Right Ankle Dorsiflexion 4/5    Left Ankle Dorsiflexion 4/5      Palpation   Spinal mobility Lumbar: hypomobile and painful throughout; Cervical: hypomobile with C7-T1 painful    Palpation comment TTP: bilateral upper trapezius, thoracic paraspinals, lumbar paraspinals (reproduce familiar pain), QL, cervical paraspinals (familiar pain), gluteals (R>L), and piriformis (R>L)                        Objective measurements completed on examination: See above findings.       [facility] Adult PT Treatment/Exercise - 02/19/22 0001       Exercises   Exercises Lumbar      Lumbar Exercises: Stretches   Lower Trunk Rotation Limitations    Lower Trunk Rotation Limitations attempted, but unable to complete due to pain      Lumbar Exercises: Seated   Other Seated Lumbar Exercises Scapular retraction and depression   12 reps each   Other Seated Lumbar Exercises Slouch/overcorrect   10 reps each                         PT Long Term Goals - 02/19/22 1406       PT LONG TERM GOAL #1   Title Patient will be independent with her HEP.    Time 6    Period Weeks    Status New    Target Date 04/02/22      PT LONG TERM GOAL #2   Title Patient will be able to complete her daily activities without her familiar lumbar pain exceeding 5/10.    Time 6    Period Weeks    Status New    Target Date 04/02/22      PT LONG TERM GOAL #3   Title Patient will be able to lift at least 8 pounds overhead without being limited by her cervical symptoms for improved function  with household activities.    Time 6    Period Weeks    Status New    Target Date 04/02/22      PT LONG TERM GOAL #4   Title Patient will be able to stand at least 45 minutes without being limited by her familiar pain for improved ability cleaning.    Time 6    Period Weeks    Status New    Target Date 04/02/22      PT LONG TERM GOAL #5   Title Patient will be able to navigate at least 4 steps with a reciprical pattern for improved household navigation.    Time 6    Period Weeks    Status New    Target Date 04/02/22                    Plan - 02/19/22 1105     Clinical Impression Statement Patient is a 71 year old female presenting  to physical therapy with chronic cervical and low back pain. She presented with moderate pain severity and irritability with her low back pain being the most severe. Her familiar pain was able to be reproduced to lumbar extension and palpation to her cervical and lumbar paraspinals. She was introduced to multiple new interventions which she was able to properly perform without aggravating her symptoms and these interventions were added to her HEP. Recommend that she continue with skilled physical therapy to address her remaining impairments to maximize her functional mobiltiy.    Personal Factors and Comorbidities Time since onset of injury/illness/exacerbation;Other    Examination-Activity Limitations Caring for Others;Locomotion Level;Transfers    Examination-Participation Restrictions Cleaning;Community Activity;Yard Work    [employer] Evolving/Moderate complexity    Clinical Decision Making Moderate    Rehab Potential Good    PT Frequency 2x / week    PT Duration 6 weeks    PT Treatment/Interventions ADLs/Self Care Home Management;Cryotherapy;Electrical Stimulation;Moist Heat;Traction;Neuromuscular re-education;Therapeutic exercise;Therapeutic activities;Functional mobility training;Patient/family education;Stair  training;Manual techniques;Dry needling;Taping    PT Next Visit Plan nustep, core stabilization, postural re-ed, and modalities as needed    PT Home Exercise Plan Access Code: [email or web address]  URL: [email or web address]  Date: 02/19/2022  Prepared by: Jacqulynn Cadet    Exercises  - Seated Scapular Retraction  - 2 x daily - 7 x weekly - 2 sets - 10 reps  - Standing Scapular Depression  - 2 x daily - 7 x weekly - 2 sets - 10 reps  - Slouch Overcorrect on Swiss Ball  - 2 x daily - 7 x weekly - 2 sets - 10 reps    Consulted and Agree with Plan of Care Patient             Patient will benefit from skilled therapeutic intervention in order to improve the following deficits and impairments:  Decreased range of motion, Difficulty walking, Pain, Decreased activity tolerance, Hypomobility, Decreased strength, Decreased mobility  Visit Diagnosis: Other low back pain  Cervicalgia     Problem List Patient Active Problem List   Diagnosis Date Noted   Plantar fasciitis of right foot 02/27/2019   Bilateral hearing loss 06/24/2016   Rhinitis, chronic 06/24/2016   Internal hemorrhoids 02/05/2013   IBS (irritable bowel syndrome) 12/05/2011   Anxiety disorder 12/05/2011   GERD (gastroesophageal reflux disease) 12/05/2011   IBS 08/16/2010   DIAB W/UNS COMP TYPE II/UNS NOT STATED UNCNTRL 12/02/2008   GENERALIZED ANXIETY DISORDER 12/02/2008   GERD 12/02/2008   DYSPHAGIA 12/02/2008   HYPERLIPIDEMIA 01/22/2008   IRRITABLE BOWEL SYNDROME, HX OF 01/22/2008   ULCER OF ESOPHAGUS WITHOUT BLEEDING 05/12/2006   GASTRITIS 05/12/2006   COLITIS 12/14/2003    Darlin Coco, PT 02/19/2022, 6:28 PM  [facility] [name] [place], [place], [address] Phone: [phone]   Fax:  [phone]  Name: Adrienne [name] MRN: [id number] Date of Birth: Oct 30, 1950

## 2022-02-22 ENCOUNTER — Ambulatory Visit: Payer: PPO

## 2022-02-22 DIAGNOSIS — M5459 Other low back pain: Secondary | ICD-10-CM | POA: Diagnosis not present

## 2022-02-22 DIAGNOSIS — M542 Cervicalgia: Secondary | ICD-10-CM

## 2022-02-22 NOTE — Therapy (Signed)
[facility] [place], [place], [address] Phone: [phone]   Fax:  [phone]  Physical Therapy Treatment  Patient Details  Name: Adrienne [name] MRN: [id number] Date of Birth: 05-14-1951 Referring Provider (PT): Ellene Route, MD   Encounter Date: 02/22/2022   PT End of Session - 02/22/22 0912     Visit Number 2    Number of Visits 12    Date for PT Re-Evaluation 04/26/22    PT Start Time 0900    PT Stop Time 0945    PT Time Calculation (min) 45 min    Activity Tolerance Patient tolerated treatment well    Behavior During Therapy [facility] for tasks assessed/performed             Past Medical History:  Diagnosis Date   Allergy    Arthritis    hands/fingers   Dental crowns present    Depression    Difficulty swallowing solids    due to esophageal stricture   Eczema    Esophageal reflux    Esophageal stricture    Family history of adverse reaction to anesthesia    pt's mother had hx. of N/V and being hard to wake up post-op   Generalized anxiety disorder    GERD (gastroesophageal reflux disease)    Hyperlipidemia    Hypertension    states under control with med., has been on med. x 30 yr.   Insulin dependent diabetes mellitus    Irritable bowel syndrome    Nutcracker esophagus    Seasonal allergies    Trigger finger of right hand 09/2017   index and middle fingers   Wears hearing aid in both ears     Past Surgical History:  Procedure Laterality Date   CARPAL TUNNEL RELEASE Left 01/07/2014   Procedure: LEFT CARPAL TUNNEL RELEASE;  Surgeon: Tennis Must, MD;  Location: [place];  Service: Orthopedics;  Laterality: Left;   COLONOSCOPY     ESOPHAGEAL MANOMETRY  05/28/2006   EYE SURGERY Right    tear duct repair   NASAL SINUS SURGERY     TONSILLECTOMY     TRIGGER FINGER RELEASE Right 11/06/2017   Procedure: RELEASE TRIGGER FINGER/A-1 PULLEY RIGHT INDEX FINGER AND RIGHT MIDDLE FINGER;  Surgeon:  Leanora Cover, MD;  Location: [place];  Service: Orthopedics;  Laterality: Right;   UPPER GASTROINTESTINAL ENDOSCOPY  10/18/2013   with dilation; with Propofol   UPPER GASTROINTESTINAL ENDOSCOPY  10/30/2016    There were no vitals filed for this visit.   Subjective Assessment - 02/22/22 0910     Subjective Patient reports that her neck and back feel about the same as her evaluation today. She notes that she has been doing her HEP, but she has not noticed a significant difference yet.    Pertinent History hip bursitis bilaterally (injections helped)    Limitations Standing;Walking;House hold activities;Lifting    How long can you stand comfortably? 20-30 minutes    Patient Stated Goals reduced pain, stand longer, clean    Currently in Pain? No/denies    Pain Onset More than a month ago    Pain Onset More than a month ago                               [facility] Adult PT Treatment/Exercise - 02/22/22 0001       Lumbar Exercises: Stretches   Double Knee to Chest  Stretch Other (comment)   3 minutes; with LE supported on red ball   Other Lumbar Stretch Exercise Open book   SL; 20 reps each     Lumbar Exercises: Aerobic   Nustep L3 x 15 minutes      Lumbar Exercises: Standing   Row Both   2 minutes   Row Limitations Blue XTS    Shoulder Extension Both   2 minutes   Shoulder Extension Limitations Blue XTS    Other Standing Lumbar Exercises Ball roll out   3 minutes                    PT Education - 02/22/22 1009     Education Details healing, muscular strength, DOMS    Person(s) Educated Patient    Methods Explanation    Comprehension Verbalized understanding                 PT Long Term Goals - 02/19/22 1406       PT LONG TERM GOAL #1   Title Patient will be independent with her HEP.    Time 6    Period Weeks    Status New    Target Date 04/02/22      PT LONG TERM GOAL #2   Title Patient will be able to complete  her daily activities without her familiar lumbar pain exceeding 5/10.    Time 6    Period Weeks    Status New    Target Date 04/02/22      PT LONG TERM GOAL #3   Title Patient will be able to lift at least 8 pounds overhead without being limited by her cervical symptoms for improved function with household activities.    Time 6    Period Weeks    Status New    Target Date 04/02/22      PT LONG TERM GOAL #4   Title Patient will be able to stand at least 45 minutes without being limited by her familiar pain for improved ability cleaning.    Time 6    Period Weeks    Status New    Target Date 04/02/22      PT LONG TERM GOAL #5   Title Patient will be able to navigate at least 4 steps with a reciprical pattern for improved household navigation.    Time 6    Period Weeks    Status New    Target Date 04/02/22                   Plan - 02/22/22 0935     Clinical Impression Statement Patient was introduced to multiple new interventions. She required minimal cueing with today's new interventions for proper exercise performance. She reported no increase in pain or discomfort with any of today's interventions. She reported that her back felt a little less tight upon the conclusion of treatment. She continues to require skilled physical therapy to address her remaining impairments to maximize her functional mobility.    Personal Factors and Comorbidities Time since onset of injury/illness/exacerbation;Other    Examination-Activity Limitations Caring for Others;Locomotion Level;Transfers    Examination-Participation Restrictions Cleaning;Community Activity;Yard Work    [employer]chant [employer] officer Evolving/Moderate complexity    Rehab Potential Good    PT Frequency 2x / week    PT Duration 6 weeks    PT Treatment/Interventions ADLs/Self Care Home Management;Cryotherapy;Electrical Stimulation;Moist Heat;Traction;Neuromuscular re-education;Therapeutic exercise;Therapeutic  activities;Functional mobility training;Patient/family education;Stair training;Manual techniques;Dry needling;Taping  PT Next Visit Plan nustep, core stabilization, postural re-ed, and modalities as needed    PT Home Exercise Plan Access Code: [email or web address]  URL: [email or web address]  Date: 02/19/2022  Prepared by: Jacqulynn Cadet    Exercises  - Seated Scapular Retraction  - 2 x daily - 7 x weekly - 2 sets - 10 reps  - Standing Scapular Depression  - 2 x daily - 7 x weekly - 2 sets - 10 reps  - Slouch Overcorrect on Swiss Ball  - 2 x daily - 7 x weekly - 2 sets - 10 reps    Consulted and Agree with Plan of Care Patient             Patient will benefit from skilled therapeutic intervention in order to improve the following deficits and impairments:  Decreased range of motion, Difficulty walking, Pain, Decreased activity tolerance, Hypomobility, Decreased strength, Decreased mobility  Visit Diagnosis: Other low back pain  Cervicalgia     Problem List Patient Active Problem List   Diagnosis Date Noted   Plantar fasciitis of right foot 02/27/2019   Bilateral hearing loss 06/24/2016   Rhinitis, chronic 06/24/2016   Internal hemorrhoids 02/05/2013   IBS (irritable bowel syndrome) 12/05/2011   Anxiety disorder 12/05/2011   GERD (gastroesophageal reflux disease) 12/05/2011   IBS 08/16/2010   DIAB W/UNS COMP TYPE II/UNS NOT STATED UNCNTRL 12/02/2008   GENERALIZED ANXIETY DISORDER 12/02/2008   GERD 12/02/2008   DYSPHAGIA 12/02/2008   HYPERLIPIDEMIA 01/22/2008   IRRITABLE BOWEL SYNDROME, HX OF 01/22/2008   ULCER OF ESOPHAGUS WITHOUT BLEEDING 05/12/2006   GASTRITIS 05/12/2006   COLITIS 12/14/2003    Darlin Coco, PT 02/22/2022, 10:10 AM  [facility] [address] [place], [place], [address] Phone: [phone]   Fax:  [phone]  Name: Adrienne [name] MRN: [id number] Date of Birth: [birth year]

## 2022-02-28 ENCOUNTER — Ambulatory Visit: Payer: PPO | Attending: Neurological Surgery | Admitting: *Deleted

## 2022-02-28 DIAGNOSIS — M542 Cervicalgia: Secondary | ICD-10-CM | POA: Insufficient documentation

## 2022-02-28 DIAGNOSIS — M5459 Other low back pain: Secondary | ICD-10-CM | POA: Diagnosis not present

## 2022-02-28 NOTE — Therapy (Signed)
[facility] [place], [place], [address] Phone: [phone]   Fax:  [phone]  Physical Therapy Treatment  Patient Details  Name: Adrienne [name] MRN: [id number] Date of Birth: 1951-02-28 Referring Provider (PT): Ellene Route, MD   Encounter Date: 02/28/2022    Past Medical History:  Diagnosis Date   Allergy    Arthritis    hands/fingers   Dental crowns present    Depression    Difficulty swallowing solids    due to esophageal stricture   Eczema    Esophageal reflux    Esophageal stricture    Family history of adverse reaction to anesthesia    pt's mother had hx. of N/V and being hard to wake up post-op   Generalized anxiety disorder    GERD (gastroesophageal reflux disease)    Hyperlipidemia    Hypertension    states under control with med., has been on med. x 30 yr.   Insulin dependent diabetes mellitus    Irritable bowel syndrome    Nutcracker esophagus    Seasonal allergies    Trigger finger of right hand 09/2017   index and middle fingers   Wears hearing aid in both ears     Past Surgical History:  Procedure Laterality Date   CARPAL TUNNEL RELEASE Left 01/07/2014   Procedure: LEFT CARPAL TUNNEL RELEASE;  Surgeon: Tennis Must, MD;  Location: [place];  Service: Orthopedics;  Laterality: Left;   COLONOSCOPY     ESOPHAGEAL MANOMETRY  05/28/2006   EYE SURGERY Right    tear duct repair   NASAL SINUS SURGERY     TONSILLECTOMY     TRIGGER FINGER RELEASE Right 11/06/2017   Procedure: RELEASE TRIGGER FINGER/A-1 PULLEY RIGHT INDEX FINGER AND RIGHT MIDDLE FINGER;  Surgeon: Leanora Cover, MD;  Location: [place];  Service: Orthopedics;  Laterality: Right;   UPPER GASTROINTESTINAL ENDOSCOPY  10/18/2013   with dilation; with Propofol   UPPER GASTROINTESTINAL ENDOSCOPY  10/30/2016    There were no vitals filed for this visit.   Subjective Assessment - 02/28/22 0917     Subjective  LBP 5-6/10 with ADL's. Sleep in recliner due to LBP    Pertinent History hip bursitis bilaterally (injections helped)    Limitations Standing;Walking;House hold activities;Lifting    How long can you stand comfortably? 20-30 minutes    Patient Stated Goals reduced pain, stand longer, clean    Currently in Pain? Yes    Pain Score 6     Pain Location Back    Pain Orientation Right;Left;Lower    Pain Descriptors / Indicators Sore    Pain Type Chronic pain    Pain Onset More than a month ago                               [facility] Adult PT Treatment/Exercise - 02/28/22 0001       Self-Care   Self-Care ADL's    ADL's Pt instructed in AB bracng with ADL's to decrease LBP and act's that trigger her pain as well as transitional movements. Vacuuming and washing dishes postures were discussed and performed.      Exercises   Exercises Lumbar      Lumbar Exercises: Aerobic   Nustep L3 x 15 minutes      Lumbar Exercises: Supine   Ab Set 10 reps    Bent Knee Raise 10 reps;5 seconds  Bridge 10 reps;5 seconds                          PT Long Term Goals - 02/19/22 1406       PT LONG TERM GOAL #1   Title Patient will be independent with her HEP.    Time 6    Period Weeks    Status New    Target Date 04/02/22      PT LONG TERM GOAL #2   Title Patient will be able to complete her daily activities without her familiar lumbar pain exceeding 5/10.    Time 6    Period Weeks    Status New    Target Date 04/02/22      PT LONG TERM GOAL #3   Title Patient will be able to lift at least 8 pounds overhead without being limited by her cervical symptoms for improved function with household activities.    Time 6    Period Weeks    Status New    Target Date 04/02/22      PT LONG TERM GOAL #4   Title Patient will be able to stand at least 45 minutes without being limited by her familiar pain for improved ability cleaning.    Time 6    Period Weeks     Status New    Target Date 04/02/22      PT LONG TERM GOAL #5   Title Patient will be able to navigate at least 4 steps with a reciprical pattern for improved household navigation.    Time 6    Period Weeks    Status New    Target Date 04/02/22                    Patient will benefit from skilled therapeutic intervention in order to improve the following deficits and impairments:     Visit Diagnosis: Other low back pain  Cervicalgia     Problem List Patient Active Problem List   Diagnosis Date Noted   Plantar fasciitis of right foot 02/27/2019   Bilateral hearing loss 06/24/2016   Rhinitis, chronic 06/24/2016   Internal hemorrhoids 02/05/2013   IBS (irritable bowel syndrome) 12/05/2011   Anxiety disorder 12/05/2011   GERD (gastroesophageal reflux disease) 12/05/2011   IBS 08/16/2010   DIAB W/UNS COMP TYPE II/UNS NOT STATED UNCNTRL 12/02/2008   GENERALIZED ANXIETY DISORDER 12/02/2008   GERD 12/02/2008   DYSPHAGIA 12/02/2008   HYPERLIPIDEMIA 01/22/2008   IRRITABLE BOWEL SYNDROME, HX OF 01/22/2008   ULCER OF ESOPHAGUS WITHOUT BLEEDING 05/12/2006   GASTRITIS 05/12/2006   COLITIS 12/14/2003   Rationale for Evaluation and Treatment Rehabilitation  Omri Bertran,CHRIS, PTA 02/28/2022, 10:27 AM  [facility] [place], [place], [address] Phone: [phone]   Fax:  [phone]  Name: Adrienne [name] MRN: [id number] Date of Birth: Jun 05, 1951

## 2022-03-01 ENCOUNTER — Ambulatory Visit: Payer: PPO | Admitting: *Deleted

## 2022-03-01 DIAGNOSIS — M5459 Other low back pain: Secondary | ICD-10-CM

## 2022-03-01 DIAGNOSIS — M542 Cervicalgia: Secondary | ICD-10-CM

## 2022-03-01 NOTE — Therapy (Signed)
[facility] [place], [place], [address] Phone: [phone]   Fax:  [phone]  Physical Therapy Treatment  Patient Details  Name: Adrienne [name] MRN: [id number] Date of Birth: 09-16-1951 Referring Provider (PT): Ellene Route, MD   Encounter Date: 03/01/2022   PT End of Session - 03/01/22 0911     Visit Number 3    Number of Visits 12    Date for PT Re-Evaluation 04/26/22    PT Start Time 0900    PT Stop Time 0950    PT Time Calculation (min) 50 min             Past Medical History:  Diagnosis Date   Allergy    Arthritis    hands/fingers   Dental crowns present    Depression    Difficulty swallowing solids    due to esophageal stricture   Eczema    Esophageal reflux    Esophageal stricture    Family history of adverse reaction to anesthesia    pt's mother had hx. of N/V and being hard to wake up post-op   Generalized anxiety disorder    GERD (gastroesophageal reflux disease)    Hyperlipidemia    Hypertension    states under control with med., has been on med. x 30 yr.   Insulin dependent diabetes mellitus    Irritable bowel syndrome    Nutcracker esophagus    Seasonal allergies    Trigger finger of right hand 09/2017   index and middle fingers   Wears hearing aid in both ears     Past Surgical History:  Procedure Laterality Date   CARPAL TUNNEL RELEASE Left 01/07/2014   Procedure: LEFT CARPAL TUNNEL RELEASE;  Surgeon: Tennis Must, MD;  Location: [place];  Service: Orthopedics;  Laterality: Left;   COLONOSCOPY     ESOPHAGEAL MANOMETRY  05/28/2006   EYE SURGERY Right    tear duct repair   NASAL SINUS SURGERY     TONSILLECTOMY     TRIGGER FINGER RELEASE Right 11/06/2017   Procedure: RELEASE TRIGGER FINGER/A-1 PULLEY RIGHT INDEX FINGER AND RIGHT MIDDLE FINGER;  Surgeon: Leanora Cover, MD;  Location: [place];  Service: Orthopedics;  Laterality: Right;   UPPER  GASTROINTESTINAL ENDOSCOPY  10/18/2013   with dilation; with Propofol   UPPER GASTROINTESTINAL ENDOSCOPY  10/30/2016    There were no vitals filed for this visit.   Subjective Assessment - 03/01/22 0909     Subjective LBP 4/10 with ADL's. Sleep in recliner due to LBP. Did good after last Rx    Pertinent History hip bursitis bilaterally (injections helped)    Limitations Standing;Walking;House hold activities;Lifting    How long can you stand comfortably? 20-30 minutes    Patient Stated Goals reduced pain, stand longer, clean    Currently in Pain? Yes    Pain Score 4     Pain Location Back    Pain Orientation Right;Left    Pain Descriptors / Indicators Sore    Pain Onset More than a month ago                               [facility] Adult PT Treatment/Exercise - 03/01/22 0001       Self-Care   Self-Care ADL's    ADL's Pt instructed in AB bracng with ADL's to decrease LBP and act's that trigger her pain as well as transitional  movements. Vacuuming and washing dishes postures were discussed and performed.      Exercises   Exercises Lumbar      Lumbar Exercises: Aerobic   Nustep L3 x 15 minutes      Lumbar Exercises: Standing   Row Both   2x10 hold 5 secs   Theraband Level (Row) --   XTS blue   Shoulder Extension Both;10 reps   hold 5 secs   Other Standing Lumbar Exercises Red band given for HEP Habd and ROWS      Lumbar Exercises: Supine   Ab Set 10 reps    AB Set Limitations instructed in Drawin ex for TA activation    Bent Knee Raise --    Bridge --                          PT Long Term Goals - 02/19/22 1406       PT LONG TERM GOAL #1   Title Patient will be independent with her HEP.    Time 6    Period Weeks    Status New    Target Date 04/02/22      PT LONG TERM GOAL #2   Title Patient will be able to complete her daily activities without her familiar lumbar pain exceeding 5/10.    Time 6    Period Weeks    Status New     Target Date 04/02/22      PT LONG TERM GOAL #3   Title Patient will be able to lift at least 8 pounds overhead without being limited by her cervical symptoms for improved function with household activities.    Time 6    Period Weeks    Status New    Target Date 04/02/22      PT LONG TERM GOAL #4   Title Patient will be able to stand at least 45 minutes without being limited by her familiar pain for improved ability cleaning.    Time 6    Period Weeks    Status New    Target Date 04/02/22      PT LONG TERM GOAL #5   Title Patient will be able to navigate at least 4 steps with a reciprical pattern for improved household navigation.    Time 6    Period Weeks    Status New    Target Date 04/02/22                   Plan - 03/01/22 0911     Clinical Impression Statement Pt arrived today doing fair with LBP and cervical pain. Rx focused on core activation exs as well as postural exs. Red tband given for HEP rows and H-abduction. She did well with progression of exs and activities. Drawin ex was challenging and needs to be reviewed.    Personal Factors and Comorbidities Time since onset of injury/illness/exacerbation;Other    Examination-Activity Limitations Caring for Others;Locomotion Level;Transfers    Examination-Participation Restrictions Cleaning;Community Activity;Yard Work    [employer]chant [employer] officer Evolving/Moderate complexity    Rehab Potential Good    PT Frequency 2x / week    PT Duration 6 weeks    PT Treatment/Interventions ADLs/Self Care Home Management;Cryotherapy;Electrical Stimulation;Moist Heat;Traction;Neuromuscular re-education;Therapeutic exercise;Therapeutic activities;Functional mobility training;Patient/family education;Stair training;Manual techniques;Dry needling;Taping    PT Next Visit Plan nustep, core stabilization, postural re-ed, and modalities as needed    PT Home Exercise Plan Access Code: [email or web address]  URL:  [email or web address]  Date: 02/19/2022  Prepared by: Jacqulynn Cadet    Exercises  - Seated Scapular Retraction  - 2 x daily - 7 x weekly - 2 sets - 10 reps  - Standing Scapular Depression  - 2 x daily - 7 x weekly - 2 sets - 10 reps  - Slouch Overcorrect on Swiss Ball  - 2 x daily - 7 x weekly - 2 sets - 10 reps    Consulted and Agree with Plan of Care Patient             Patient will benefit from skilled therapeutic intervention in order to improve the following deficits and impairments:  Decreased range of motion, Difficulty walking, Pain, Decreased activity tolerance, Hypomobility, Decreased strength, Decreased mobility  Visit Diagnosis: Other low back pain  Cervicalgia     Problem List Patient Active Problem List   Diagnosis Date Noted   Plantar fasciitis of right foot 02/27/2019   Bilateral hearing loss 06/24/2016   Rhinitis, chronic 06/24/2016   Internal hemorrhoids 02/05/2013   IBS (irritable bowel syndrome) 12/05/2011   Anxiety disorder 12/05/2011   GERD (gastroesophageal reflux disease) 12/05/2011   IBS 08/16/2010   DIAB W/UNS COMP TYPE II/UNS NOT STATED UNCNTRL 12/02/2008   GENERALIZED ANXIETY DISORDER 12/02/2008   GERD 12/02/2008   DYSPHAGIA 12/02/2008   HYPERLIPIDEMIA 01/22/2008   IRRITABLE BOWEL SYNDROME, HX OF 01/22/2008   ULCER OF ESOPHAGUS WITHOUT BLEEDING 05/12/2006   GASTRITIS 05/12/2006   COLITIS 12/14/2003   Rationale for Evaluation and Treatment Rehabilitation  Broady Lafoy,CHRIS, PTA 03/01/2022, 12:56 PM  [facility] [place], [place], [address] Phone: [phone]   Fax:  [phone]  Name: NIMISHA RATHEL MRN: [id number] Date of Birth: [birth year]

## 2022-03-04 ENCOUNTER — Ambulatory Visit: Payer: PPO

## 2022-03-04 DIAGNOSIS — M542 Cervicalgia: Secondary | ICD-10-CM

## 2022-03-04 DIAGNOSIS — M5459 Other low back pain: Secondary | ICD-10-CM | POA: Diagnosis not present

## 2022-03-04 NOTE — Therapy (Signed)
[facility] [place], [place], [address] Phone: [phone]   Fax:  [phone]  Physical Therapy Treatment  Patient Details  Name: Adrienne [name] MRN: [id number] Date of Birth: [birth year] Referring Provider (PT): Ellene Route, MD   Encounter Date: 03/04/2022   PT End of Session - 03/04/22 0859     Visit Number 4    Number of Visits 12    Date for PT Re-Evaluation 04/26/22    PT Start Time 0900    PT Stop Time 0945    PT Time Calculation (min) 45 min    Activity Tolerance Patient tolerated treatment well    Behavior During Therapy [facility] for tasks assessed/performed             Past Medical History:  Diagnosis Date   Allergy    Arthritis    hands/fingers   Dental crowns present    Depression    Difficulty swallowing solids    due to esophageal stricture   Eczema    Esophageal reflux    Esophageal stricture    Family history of adverse reaction to anesthesia    pt's mother had hx. of N/V and being hard to wake up post-op   Generalized anxiety disorder    GERD (gastroesophageal reflux disease)    Hyperlipidemia    Hypertension    states under control with med., has been on med. x 30 yr.   Insulin dependent diabetes mellitus    Irritable bowel syndrome    Nutcracker esophagus    Seasonal allergies    Trigger finger of right hand 09/2017   index and middle fingers   Wears hearing aid in both ears     Past Surgical History:  Procedure Laterality Date   CARPAL TUNNEL RELEASE Left 01/07/2014   Procedure: LEFT CARPAL TUNNEL RELEASE;  Surgeon: Tennis Must, MD;  Location: [place];  Service: Orthopedics;  Laterality: Left;   COLONOSCOPY     ESOPHAGEAL MANOMETRY  05/28/2006   EYE SURGERY Right    tear duct repair   NASAL SINUS SURGERY     TONSILLECTOMY     TRIGGER FINGER RELEASE Right 11/06/2017   Procedure: RELEASE TRIGGER FINGER/A-1 PULLEY RIGHT INDEX FINGER AND RIGHT MIDDLE FINGER;  Surgeon:  Leanora Cover, MD;  Location: [place];  Service: Orthopedics;  Laterality: Right;   UPPER GASTROINTESTINAL ENDOSCOPY  10/18/2013   with dilation; with Propofol   UPPER GASTROINTESTINAL ENDOSCOPY  10/30/2016    There were no vitals filed for this visit.   Subjective Assessment - 03/04/22 0905     Subjective Patient reports that her neck and back feel better today. She notes that the exercises she did at her last appointment helped.    Pertinent History hip bursitis bilaterally (injections helped)    Limitations Standing;Walking;House hold activities;Lifting    How long can you stand comfortably? 20-30 minutes    Patient Stated Goals reduced pain, stand longer, clean    Currently in Pain? Yes    Pain Score 3     Pain Location Neck    Pain Onset More than a month ago    Pain Score 4    Pain Location Back                               [facility] Adult PT Treatment/Exercise - 03/04/22 0001       Lumbar Exercises: Aerobic  Nustep L4 x 15 minutes      Lumbar Exercises: Standing   Shoulder Extension Both;20 reps;10 reps    Shoulder Extension Limitations Blue XTS    Other Standing Lumbar Exercises Ball press   5 second hold; 2 minutes (forward) and 2 minutes with rotation   Other Standing Lumbar Exercises Multifidus press out   yellow t-band; 20 reps each     Lumbar Exercises: Supine   Bridge 20 reps;Non-compliant    Straight Leg Raise 10 reps   2 reps each                         PT Long Term Goals - 02/19/22 1406       PT LONG TERM GOAL #1   Title Patient will be independent with her HEP.    Time 6    Period Weeks    Status New    Target Date 04/02/22      PT LONG TERM GOAL #2   Title Patient will be able to complete her daily activities without her familiar lumbar pain exceeding 5/10.    Time 6    Period Weeks    Status New    Target Date 04/02/22      PT LONG TERM GOAL #3   Title Patient will be able to lift at  least 8 pounds overhead without being limited by her cervical symptoms for improved function with household activities.    Time 6    Period Weeks    Status New    Target Date 04/02/22      PT LONG TERM GOAL #4   Title Patient will be able to stand at least 45 minutes without being limited by her familiar pain for improved ability cleaning.    Time 6    Period Weeks    Status New    Target Date 04/02/22      PT LONG TERM GOAL #5   Title Patient will be able to navigate at least 4 steps with a reciprical pattern for improved household navigation.    Time 6    Period Weeks    Status New    Target Date 04/02/22                   Plan - 03/04/22 0926     Clinical Impression Statement Patient presented with reduced cervical and lumbar pain following her last appointment. Treatment focused on familiar interventions for improved lumbar strength and stability. She required minimal cueing with straight leg raises for proper performance for improved lumbar stability. She reported that her neck felt better, but her back was hurting a little upon the conclusion of treatment. She continues to require skilled physical therapy to address her remaining impairments to return to her prior level of function.    Personal Factors and Comorbidities Time since onset of injury/illness/exacerbation;Other    Examination-Activity Limitations Caring for Others;Locomotion Level;Transfers    Examination-Participation Restrictions Cleaning;Community Activity;Yard Work    [employer] Evolving/Moderate complexity    Rehab Potential Good    PT Frequency 2x / week    PT Duration 6 weeks    PT Treatment/Interventions ADLs/Self Care Home Management;Cryotherapy;Electrical Stimulation;Moist Heat;Traction;Neuromuscular re-education;Therapeutic exercise;Therapeutic activities;Functional mobility training;Patient/family education;Stair training;Manual techniques;Dry needling;Taping    PT Next  Visit Plan nustep, core stabilization, postural re-ed, and modalities as needed    PT Home Exercise Plan Access Code: [email or web address]  URL: [email or web address]  Date: 02/19/2022  Prepared by:  Jacqulynn Cadet    Exercises  - Seated Scapular Retraction  - 2 x daily - 7 x weekly - 2 sets - 10 reps  - Standing Scapular Depression  - 2 x daily - 7 x weekly - 2 sets - 10 reps  - Slouch Overcorrect on Swiss Ball  - 2 x daily - 7 x weekly - 2 sets - 10 reps    Consulted and Agree with Plan of Care Patient             Patient will benefit from skilled therapeutic intervention in order to improve the following deficits and impairments:  Decreased range of motion, Difficulty walking, Pain, Decreased activity tolerance, Hypomobility, Decreased strength, Decreased mobility  Visit Diagnosis: Other low back pain  Cervicalgia     Problem List Patient Active Problem List   Diagnosis Date Noted   Plantar fasciitis of right foot 02/27/2019   Bilateral hearing loss 06/24/2016   Rhinitis, chronic 06/24/2016   Internal hemorrhoids 02/05/2013   IBS (irritable bowel syndrome) 12/05/2011   Anxiety disorder 12/05/2011   GERD (gastroesophageal reflux disease) 12/05/2011   IBS 08/16/2010   DIAB W/UNS COMP TYPE II/UNS NOT STATED UNCNTRL 12/02/2008   GENERALIZED ANXIETY DISORDER 12/02/2008   GERD 12/02/2008   DYSPHAGIA 12/02/2008   HYPERLIPIDEMIA 01/22/2008   IRRITABLE BOWEL SYNDROME, HX OF 01/22/2008   ULCER OF ESOPHAGUS WITHOUT BLEEDING 05/12/2006   GASTRITIS 05/12/2006   COLITIS 12/14/2003    Darlin Coco, PT 03/04/2022, 12:31 PM  [facility] [address] [place], [place], [address] Phone: [phone]   Fax:  [phone]  Name: Adrienne [name] MRN: [id number] Date of Birth: December 20, 1950

## 2022-03-06 ENCOUNTER — Ambulatory Visit: Payer: PPO

## 2022-03-06 DIAGNOSIS — M5459 Other low back pain: Secondary | ICD-10-CM

## 2022-03-06 DIAGNOSIS — M542 Cervicalgia: Secondary | ICD-10-CM

## 2022-03-06 NOTE — Therapy (Signed)
[facility] [place], [place], [address] Phone: [phone]   Fax:  [phone]  Physical Therapy Treatment  Patient Details  Name: Adrienne [name] MRN: [id number] Date of Birth: [birth year] Referring Provider (PT): Ellene Route, MD   Encounter Date: 03/06/2022   PT End of Session - 03/06/22 1042     Visit Number 5    Number of Visits 12    Date for PT Re-Evaluation 04/26/22    PT Start Time 1030    PT Stop Time 1115    PT Time Calculation (min) 45 min    Activity Tolerance Patient tolerated treatment well    Behavior During Therapy [facility] for tasks assessed/performed             Past Medical History:  Diagnosis Date   Allergy    Arthritis    hands/fingers   Dental crowns present    Depression    Difficulty swallowing solids    due to esophageal stricture   Eczema    Esophageal reflux    Esophageal stricture    Family history of adverse reaction to anesthesia    pt's mother had hx. of N/V and being hard to wake up post-op   Generalized anxiety disorder    GERD (gastroesophageal reflux disease)    Hyperlipidemia    Hypertension    states under control with med., has been on med. x 30 yr.   Insulin dependent diabetes mellitus    Irritable bowel syndrome    Nutcracker esophagus    Seasonal allergies    Trigger finger of right hand 09/2017   index and middle fingers   Wears hearing aid in both ears     Past Surgical History:  Procedure Laterality Date   CARPAL TUNNEL RELEASE Left 01/07/2014   Procedure: LEFT CARPAL TUNNEL RELEASE;  Surgeon: Tennis Must, MD;  Location: [place];  Service: Orthopedics;  Laterality: Left;   COLONOSCOPY     ESOPHAGEAL MANOMETRY  05/28/2006   EYE SURGERY Right    tear duct repair   NASAL SINUS SURGERY     TONSILLECTOMY     TRIGGER FINGER RELEASE Right 11/06/2017   Procedure: RELEASE TRIGGER FINGER/A-1 PULLEY RIGHT INDEX FINGER AND RIGHT MIDDLE FINGER;  Surgeon:  Leanora Cover, MD;  Location: [place];  Service: Orthopedics;  Laterality: Right;   UPPER GASTROINTESTINAL ENDOSCOPY  10/18/2013   with dilation; with Propofol   UPPER GASTROINTESTINAL ENDOSCOPY  10/30/2016    There were no vitals filed for this visit.   Subjective Assessment - 03/06/22 1036     Subjective Patient reports that her back is a little stiff and sore this morning. However, it was not as bad as Monday and Tuesday as she had to sit for long periods at her sisters doctors appointment.    Pertinent History hip bursitis bilaterally (injections helped)    Limitations Standing;Walking;House hold activities;Lifting    How long can you stand comfortably? 20-30 minutes    Patient Stated Goals reduced pain, stand longer, clean    Currently in Pain? Yes    Pain Score 4     Pain Location Back    Pain Orientation Lower    Pain Descriptors / Indicators Tightness;Sore    Pain Onset More than a month ago                               [facility] Adult  PT Treatment/Exercise - 03/06/22 0001       Lumbar Exercises: Stretches   Double Knee to Chest Stretch Other (comment)   2 minutes of continueing motion   Lower Trunk Rotation Other (comment)   2 minutes of continuous motion   Other Lumbar Stretch Exercise Ball roll out   multidirectional; 2 minutes     Lumbar Exercises: Aerobic   Nustep L5 x 15 minutes      Lumbar Exercises: Standing   Shoulder Extension Both   2 minutes neutral and with rotation   Shoulder Extension Limitations Blue XTS      Lumbar Exercises: Seated   Other Seated Lumbar Exercises Flexion with rotation   20 reps each   Other Seated Lumbar Exercises Slouch overcorrect   20 reps                         PT Long Term Goals - 02/19/22 1406       PT LONG TERM GOAL #1   Title Patient will be independent with her HEP.    Time 6    Period Weeks    Status New    Target Date 04/02/22      PT LONG TERM GOAL #2    Title Patient will be able to complete her daily activities without her familiar lumbar pain exceeding 5/10.    Time 6    Period Weeks    Status New    Target Date 04/02/22      PT LONG TERM GOAL #3   Title Patient will be able to lift at least 8 pounds overhead without being limited by her cervical symptoms for improved function with household activities.    Time 6    Period Weeks    Status New    Target Date 04/02/22      PT LONG TERM GOAL #4   Title Patient will be able to stand at least 45 minutes without being limited by her familiar pain for improved ability cleaning.    Time 6    Period Weeks    Status New    Target Date 04/02/22      PT LONG TERM GOAL #5   Title Patient will be able to navigate at least 4 steps with a reciprical pattern for improved household navigation.    Time 6    Period Weeks    Status New    Target Date 04/02/22                   Plan - 03/06/22 1042     Clinical Impression Statement Patient was introduced to multiple new interventions for improved lumbar mobility and stability. She required minimal cueing with these new interventions for proper biomechanics to facilitate improved soft tissue extensiblity. She was educated on the benefits of performing her HEP throughout the day to prevent a significant increase in low back discomfort. She reported that her back felt better upon the conclusion of treatment. She continues to require skilled physical therapy to address her remaining impairments to return to her prior level of function.    Personal Factors and Comorbidities Time since onset of injury/illness/exacerbation;Other    Examination-Activity Limitations Caring for Others;Locomotion Level;Transfers    Examination-Participation Restrictions Cleaning;Community Activity;Yard Work    Stability/Clinical Decision Making Evolving/Moderate complexity    Rehab Potential Good    PT Frequency 2x / week    PT Duration 6 weeks    PT  Treatment/Interventions  ADLs/Self Care Home Management;Cryotherapy;Electrical Stimulation;Moist Heat;Traction;Neuromuscular re-education;Therapeutic exercise;Therapeutic activities;Functional mobility training;Patient/family education;Stair training;Manual techniques;Dry needling;Taping    PT Next Visit Plan nustep, core stabilization, postural re-ed, and modalities as needed    PT Home Exercise Plan Access Code: [email or web address]  URL: [email or web address]  Date: 02/19/2022  Prepared by: Jacqulynn Cadet    Exercises  - Seated Scapular Retraction  - 2 x daily - 7 x weekly - 2 sets - 10 reps  - Standing Scapular Depression  - 2 x daily - 7 x weekly - 2 sets - 10 reps  - Slouch Overcorrect on Swiss Ball  - 2 x daily - 7 x weekly - 2 sets - 10 reps    Consulted and Agree with Plan of Care Patient             Patient will benefit from skilled therapeutic intervention in order to improve the following deficits and impairments:  Decreased range of motion, Difficulty walking, Pain, Decreased activity tolerance, Hypomobility, Decreased strength, Decreased mobility  Visit Diagnosis: Other low back pain  Cervicalgia     Problem List Patient Active Problem List   Diagnosis Date Noted   Plantar fasciitis of right foot 02/27/2019   Bilateral hearing loss 06/24/2016   Rhinitis, chronic 06/24/2016   Internal hemorrhoids 02/05/2013   IBS (irritable bowel syndrome) 12/05/2011   Anxiety disorder 12/05/2011   GERD (gastroesophageal reflux disease) 12/05/2011   IBS 08/16/2010   DIAB W/UNS COMP TYPE II/UNS NOT STATED UNCNTRL 12/02/2008   GENERALIZED ANXIETY DISORDER 12/02/2008   GERD 12/02/2008   DYSPHAGIA 12/02/2008   HYPERLIPIDEMIA 01/22/2008   IRRITABLE BOWEL SYNDROME, HX OF 01/22/2008   ULCER OF ESOPHAGUS WITHOUT BLEEDING 05/12/2006   GASTRITIS 05/12/2006   COLITIS 12/14/2003   Rationale for Evaluation and Treatment Rehabilitation   Darlin Coco, PT 03/06/2022, 12:46 PM  [facility] [place], [place], [address] Phone: [phone]   Fax:  [phone]  Name: Adrienne [name] MRN: [id number] Date of Birth: 03/20/1951

## 2022-03-12 ENCOUNTER — Ambulatory Visit: Payer: PPO

## 2022-03-12 DIAGNOSIS — M5459 Other low back pain: Secondary | ICD-10-CM | POA: Diagnosis not present

## 2022-03-12 DIAGNOSIS — M542 Cervicalgia: Secondary | ICD-10-CM

## 2022-03-12 NOTE — Therapy (Signed)
[facility] [place], [place], [address] Phone: [phone]   Fax:  [phone]  Physical Therapy Treatment  Patient Details  Name: Adrienne [name] MRN: [id number] Date of Birth: 02-08-1951 Referring Provider (PT): Ellene Route, MD   Encounter Date: 03/12/2022   PT End of Session - 03/12/22 1545     Visit Number 6    Number of Visits 12    Date for PT Re-Evaluation 04/26/22    PT Start Time 6789    PT Stop Time 1600    PT Time Calculation (min) 45 min    Activity Tolerance Patient tolerated treatment well    Behavior During Therapy [facility] for tasks assessed/performed             Past Medical History:  Diagnosis Date   Allergy    Arthritis    hands/fingers   Dental crowns present    Depression    Difficulty swallowing solids    due to esophageal stricture   Eczema    Esophageal reflux    Esophageal stricture    Family history of adverse reaction to anesthesia    pt's mother had hx. of N/V and being hard to wake up post-op   Generalized anxiety disorder    GERD (gastroesophageal reflux disease)    Hyperlipidemia    Hypertension    states under control with med., has been on med. x 30 yr.   Insulin dependent diabetes mellitus    Irritable bowel syndrome    Nutcracker esophagus    Seasonal allergies    Trigger finger of right hand 09/2017   index and middle fingers   Wears hearing aid in both ears     Past Surgical History:  Procedure Laterality Date   CARPAL TUNNEL RELEASE Left 01/07/2014   Procedure: LEFT CARPAL TUNNEL RELEASE;  Surgeon: Tennis Must, MD;  Location: [place];  Service: Orthopedics;  Laterality: Left;   COLONOSCOPY     ESOPHAGEAL MANOMETRY  05/28/2006   EYE SURGERY Right    tear duct repair   NASAL SINUS SURGERY     TONSILLECTOMY     TRIGGER FINGER RELEASE Right 11/06/2017   Procedure: RELEASE TRIGGER FINGER/A-1 PULLEY RIGHT INDEX FINGER AND RIGHT MIDDLE FINGER;  Surgeon:  Leanora Cover, MD;  Location: [place];  Service: Orthopedics;  Laterality: Right;   UPPER GASTROINTESTINAL ENDOSCOPY  10/18/2013   with dilation; with Propofol   UPPER GASTROINTESTINAL ENDOSCOPY  10/30/2016    There were no vitals filed for this visit.   Subjective Assessment - 03/12/22 1515     Subjective Patient reports that her neck feels better, but her back is stiff from having to sit for 4 hours this morning.    Pertinent History hip bursitis bilaterally (injections helped)    Limitations Standing;Walking;House hold activities;Lifting    How long can you stand comfortably? 20-30 minutes    Patient Stated Goals reduced pain, stand longer, clean    Currently in Pain? Yes    Pain Score 4     Pain Location Back    Pain Orientation Lower    Pain Descriptors / Indicators Tightness    Pain Onset More than a month ago                               [facility] Adult PT Treatment/Exercise - 03/12/22 0001       Exercises   Exercises  Knee/Hip      Lumbar Exercises: Aerobic   Nustep L5 x 15 minutes      Lumbar Exercises: Standing   Forward Lunge Other (comment)   onto 6" step; 1.5 minutes each     Lumbar Exercises: Seated   Other Seated Lumbar Exercises Flexion with rotation   3 minutes     Knee/Hip Exercises: Standing   Wall Squat 20 reps   with red ball behind her back   Rocker Board 5 minutes                 Balance Exercises - 03/12/22 0001       Balance Exercises: Standing   Standing Eyes Opened Narrow base of support (BOS);Foam/compliant surface;3 reps;Time   60 seconds   Sidestepping Foam/compliant support;Upper extremity support;Other (comment)   3 minutes                    PT Long Term Goals - 02/19/22 1406       PT LONG TERM GOAL #1   Title Patient will be independent with her HEP.    Time 6    Period Weeks    Status New    Target Date 04/02/22      PT LONG TERM GOAL #2   Title Patient will be able  to complete her daily activities without her familiar lumbar pain exceeding 5/10.    Time 6    Period Weeks    Status New    Target Date 04/02/22      PT LONG TERM GOAL #3   Title Patient will be able to lift at least 8 pounds overhead without being limited by her cervical symptoms for improved function with household activities.    Time 6    Period Weeks    Status New    Target Date 04/02/22      PT LONG TERM GOAL #4   Title Patient will be able to stand at least 45 minutes without being limited by her familiar pain for improved ability cleaning.    Time 6    Period Weeks    Status New    Target Date 04/02/22      PT LONG TERM GOAL #5   Title Patient will be able to navigate at least 4 steps with a reciprical pattern for improved household navigation.    Time 6    Period Weeks    Status New    Target Date 04/02/22                   Plan - 03/12/22 1545     Clinical Impression Statement Treatment focused on new and familiar interventions for improved lumbar strength and mobiltiy needed for her daily activities. She required minimal cueing with lunges to prevent trunk flexion to promote upright stance. She reported feeling better upon the conclusion of treatment. She will be introduced to multiple new balance interventions due to increased instability with today's activites and patient reported fear of falling with functional activities. She continues to require skilled physical therapy to address her remaining impairments to return to her prior level of function.    Personal Factors and Comorbidities Time since onset of injury/illness/exacerbation;Other    Examination-Activity Limitations Caring for Others;Locomotion Level;Transfers    Examination-Participation Restrictions Cleaning;Community Activity;Yard Work    Stability/Clinical Decision Making Evolving/Moderate complexity    Rehab Potential Good    PT Frequency 2x / week    PT Duration 6 weeks  PT  Treatment/Interventions ADLs/Self Care Home Management;Cryotherapy;Electrical Stimulation;Moist Heat;Traction;Neuromuscular re-education;Therapeutic exercise;Therapeutic activities;Functional mobility training;Patient/family education;Stair training;Manual techniques;Dry needling;Taping    PT Next Visit Plan assess goals, as able, and balance interventions for improved lumbar and lower extremity stability    PT Home Exercise Plan Access Code: [email or web address]  URL: [email or web address]  Date: 02/19/2022  Prepared by: Jacqulynn Cadet    Exercises  - Seated Scapular Retraction  - 2 x daily - 7 x weekly - 2 sets - 10 reps  - Standing Scapular Depression  - 2 x daily - 7 x weekly - 2 sets - 10 reps  - Slouch Overcorrect on Swiss Ball  - 2 x daily - 7 x weekly - 2 sets - 10 reps    Consulted and Agree with Plan of Care Patient             Patient will benefit from skilled therapeutic intervention in order to improve the following deficits and impairments:  Decreased range of motion, Difficulty walking, Pain, Decreased activity tolerance, Hypomobility, Decreased strength, Decreased mobility  Visit Diagnosis: Other low back pain  Cervicalgia     Problem List Patient Active Problem List   Diagnosis Date Noted   Plantar fasciitis of right foot 02/27/2019   Bilateral hearing loss 06/24/2016   Rhinitis, chronic 06/24/2016   Internal hemorrhoids 02/05/2013   IBS (irritable bowel syndrome) 12/05/2011   Anxiety disorder 12/05/2011   GERD (gastroesophageal reflux disease) 12/05/2011   IBS 08/16/2010   DIAB W/UNS COMP TYPE II/UNS NOT STATED UNCNTRL 12/02/2008   GENERALIZED ANXIETY DISORDER 12/02/2008   GERD 12/02/2008   DYSPHAGIA 12/02/2008   HYPERLIPIDEMIA 01/22/2008   IRRITABLE BOWEL SYNDROME, HX OF 01/22/2008   ULCER OF ESOPHAGUS WITHOUT BLEEDING 05/12/2006   GASTRITIS 05/12/2006   COLITIS 12/14/2003   Rationale for Evaluation and Treatment Rehabilitation   Darlin Coco,  PT 03/12/2022, 6:11 PM  [facility] [place], [place], [address] Phone: [phone]   Fax:  [phone]  Name: Adrienne [name] MRN: [id number] Date of Birth: 1951-09-29

## 2022-03-13 ENCOUNTER — Ambulatory Visit: Payer: PPO | Admitting: Physical Therapy

## 2022-03-13 ENCOUNTER — Encounter: Payer: Self-pay | Admitting: Physical Therapy

## 2022-03-13 DIAGNOSIS — M5459 Other low back pain: Secondary | ICD-10-CM

## 2022-03-13 DIAGNOSIS — M542 Cervicalgia: Secondary | ICD-10-CM

## 2022-03-13 NOTE — Therapy (Signed)
[facility] [place], [place], [address] Phone: [phone]   Fax:  [phone]  Physical Therapy Treatment  Patient Details  Name: Adrienne [name] MRN: [id number] Date of Birth: 03-28-1951 Referring Provider (PT): Ellene Route, MD   Encounter Date: 03/13/2022   PT End of Session - 03/13/22 1004     Visit Number 7    Number of Visits 12    Date for PT Re-Evaluation 04/26/22    PT Start Time 0949    PT Stop Time 1030    PT Time Calculation (min) 41 min    Activity Tolerance Patient tolerated treatment well    Behavior During Therapy [facility] for tasks assessed/performed             Past Medical History:  Diagnosis Date   Allergy    Arthritis    hands/fingers   Dental crowns present    Depression    Difficulty swallowing solids    due to esophageal stricture   Eczema    Esophageal reflux    Esophageal stricture    Family history of adverse reaction to anesthesia    pt's mother had hx. of N/V and being hard to wake up post-op   Generalized anxiety disorder    GERD (gastroesophageal reflux disease)    Hyperlipidemia    Hypertension    states under control with med., has been on med. x 30 yr.   Insulin dependent diabetes mellitus    Irritable bowel syndrome    Nutcracker esophagus    Seasonal allergies    Trigger finger of right hand 09/2017   index and middle fingers   Wears hearing aid in both ears     Past Surgical History:  Procedure Laterality Date   CARPAL TUNNEL RELEASE Left 01/07/2014   Procedure: LEFT CARPAL TUNNEL RELEASE;  Surgeon: Tennis Must, MD;  Location: [place];  Service: Orthopedics;  Laterality: Left;   COLONOSCOPY     ESOPHAGEAL MANOMETRY  05/28/2006   EYE SURGERY Right    tear duct repair   NASAL SINUS SURGERY     TONSILLECTOMY     TRIGGER FINGER RELEASE Right 11/06/2017   Procedure: RELEASE TRIGGER FINGER/A-1 PULLEY RIGHT INDEX FINGER AND RIGHT MIDDLE FINGER;  Surgeon:  Leanora Cover, MD;  Location: [place];  Service: Orthopedics;  Laterality: Right;   UPPER GASTROINTESTINAL ENDOSCOPY  10/18/2013   with dilation; with Propofol   UPPER GASTROINTESTINAL ENDOSCOPY  10/30/2016    There were no vitals filed for this visit.   Subjective Assessment - 03/13/22 1003     Subjective Reports that she felt uncomfortable with balance activities.    Pertinent History hip bursitis bilaterally (injections helped)    Limitations Standing;Walking;House hold activities;Lifting    How long can you stand comfortably? 20-30 minutes    Patient Stated Goals reduced pain, stand longer, clean    Currently in Pain? Yes    Pain Score 4     Pain Location Back    Pain Orientation Lower    Pain Descriptors / Indicators Tightness    Pain Type Chronic pain    Pain Onset More than a month ago    Pain Frequency Intermittent                [facility] PT Assessment - 03/13/22 0001       Assessment   Medical Diagnosis Other chronic pain    Referring Provider (PT) Ellene Route, MD    Next  MD Visit as needed    Prior Therapy No      Precautions   Precautions None      Restrictions   Weight Bearing Restrictions No                           [facility] Adult PT Treatment/Exercise - 03/13/22 0001       Lumbar Exercises: Aerobic   Nustep L5 x 17 minutes      Lumbar Exercises: Standing   Forward Lunge 20 reps      Lumbar Exercises: Seated   Sit to Stand 15 reps      Knee/Hip Exercises: Standing   Heel Raises Both;20 reps    Heel Raises Limitations B toe raise x20 reps    Hip Abduction Stengthening;Both;20 reps;Knee straight                 Balance Exercises - 03/13/22 0001       Balance Exercises: Standing   Standing Eyes Opened Narrow base of support (BOS);Foam/compliant surface;Time    Standing Eyes Opened Time 3    Sidestepping Foam/compliant support;3 reps    Other Standing Exercises Toe taps 8" step x3 min                      PT Long Term Goals - 02/19/22 1406       PT LONG TERM GOAL #1   Title Patient will be independent with her HEP.    Time 6    Period Weeks    Status New    Target Date 04/02/22      PT LONG TERM GOAL #2   Title Patient will be able to complete her daily activities without her familiar lumbar pain exceeding 5/10.    Time 6    Period Weeks    Status New    Target Date 04/02/22      PT LONG TERM GOAL #3   Title Patient will be able to lift at least 8 pounds overhead without being limited by her cervical symptoms for improved function with household activities.    Time 6    Period Weeks    Status New    Target Date 04/02/22      PT LONG TERM GOAL #4   Title Patient will be able to stand at least 45 minutes without being limited by her familiar pain for improved ability cleaning.    Time 6    Period Weeks    Status New    Target Date 04/02/22      PT LONG TERM GOAL #5   Title Patient will be able to navigate at least 4 steps with a reciprical pattern for improved household navigation.    Time 6    Period Weeks    Status New    Target Date 04/02/22                   Plan - 03/13/22 1136     Clinical Impression Statement Patient presented in clinic with mild stiffness of lumbar spine. Patient guided through functional strengthening exercises with intermittant added support of UEs. Patient more cautious witht balance activities as limited UE support instructed via fingertip contact. Patient provided ADL instruction and demonstration to control LBP exacerbation during treatment.    Personal Factors and Comorbidities Time since onset of injury/illness/exacerbation;Other    Examination-Activity Limitations Caring for Others;Locomotion Level;Transfers    Examination-Participation Restrictions Cleaning;Community  Activity;Yard Work    [employer]chant [employer] officer Evolving/Moderate complexity    Rehab Potential Good    PT Frequency 2x / week    PT  Duration 6 weeks    PT Treatment/Interventions ADLs/Self Care Home Management;Cryotherapy;Electrical Stimulation;Moist Heat;Traction;Neuromuscular re-education;Therapeutic exercise;Therapeutic activities;Functional mobility training;Patient/family education;Stair training;Manual techniques;Dry needling;Taping    PT Next Visit Plan assess goals, as able, and balance interventions for improved lumbar and lower extremity stability    PT Home Exercise Plan Access Code: [email or web address]  URL: [email or web address]  Date: 02/19/2022  Prepared by: Jacqulynn Cadet    Exercises  - Seated Scapular Retraction  - 2 x daily - 7 x weekly - 2 sets - 10 reps  - Standing Scapular Depression  - 2 x daily - 7 x weekly - 2 sets - 10 reps  - Slouch Overcorrect on Swiss Ball  - 2 x daily - 7 x weekly - 2 sets - 10 reps    Consulted and Agree with Plan of Care Patient             Patient will benefit from skilled therapeutic intervention in order to improve the following deficits and impairments:  Decreased range of motion, Difficulty walking, Pain, Decreased activity tolerance, Hypomobility, Decreased strength, Decreased mobility  Visit Diagnosis: Other low back pain  Cervicalgia     Problem List Patient Active Problem List   Diagnosis Date Noted   Plantar fasciitis of right foot 02/27/2019   Bilateral hearing loss 06/24/2016   Rhinitis, chronic 06/24/2016   Internal hemorrhoids 02/05/2013   IBS (irritable bowel syndrome) 12/05/2011   Anxiety disorder 12/05/2011   GERD (gastroesophageal reflux disease) 12/05/2011   IBS 08/16/2010   DIAB W/UNS COMP TYPE II/UNS NOT STATED UNCNTRL 12/02/2008   GENERALIZED ANXIETY DISORDER 12/02/2008   GERD 12/02/2008   DYSPHAGIA 12/02/2008   HYPERLIPIDEMIA 01/22/2008   IRRITABLE BOWEL SYNDROME, HX OF 01/22/2008   ULCER OF ESOPHAGUS WITHOUT BLEEDING 05/12/2006   GASTRITIS 05/12/2006   COLITIS 12/14/2003   Rationale for Evaluation and Treatment Rehabilitation    Standley Brooking, PTA 03/13/2022, 11:48 AM  [facility] [place], [place], [address] Phone: [phone]   Fax:  [phone]  Name: Adrienne [name] MRN: [id number] Date of Birth: 08-06-1951

## 2022-03-19 ENCOUNTER — Ambulatory Visit: Payer: PPO | Admitting: Physical Therapy

## 2022-03-19 ENCOUNTER — Encounter: Payer: Self-pay | Admitting: Physical Therapy

## 2022-03-19 DIAGNOSIS — M5459 Other low back pain: Secondary | ICD-10-CM

## 2022-03-19 DIAGNOSIS — M542 Cervicalgia: Secondary | ICD-10-CM

## 2022-03-19 NOTE — Therapy (Signed)
[facility] [place], [place], [address] Phone: [phone]   Fax:  [phone]  Physical Therapy Treatment  Patient Details  Name: Adrienne [name] MRN: [id number] Date of Birth: [birth year] Referring Provider (PT): Ellene Route, MD   Encounter Date: 03/19/2022   PT End of Session - 03/19/22 1521     Visit Number 8    Number of Visits 12    Date for PT Re-Evaluation 04/26/22    PT Start Time 2500    PT Stop Time 1556    PT Time Calculation (min) 41 min    Activity Tolerance Patient tolerated treatment well    Behavior During Therapy [facility] for tasks assessed/performed             Past Medical History:  Diagnosis Date   Allergy    Arthritis    hands/fingers   Dental crowns present    Depression    Difficulty swallowing solids    due to esophageal stricture   Eczema    Esophageal reflux    Esophageal stricture    Family history of adverse reaction to anesthesia    pt's mother had hx. of N/V and being hard to wake up post-op   Generalized anxiety disorder    GERD (gastroesophageal reflux disease)    Hyperlipidemia    Hypertension    states under control with med., has been on med. x 30 yr.   Insulin dependent diabetes mellitus    Irritable bowel syndrome    Nutcracker esophagus    Seasonal allergies    Trigger finger of right hand 09/2017   index and middle fingers   Wears hearing aid in both ears     Past Surgical History:  Procedure Laterality Date   CARPAL TUNNEL RELEASE Left 01/07/2014   Procedure: LEFT CARPAL TUNNEL RELEASE;  Surgeon: Tennis Must, MD;  Location: [place];  Service: Orthopedics;  Laterality: Left;   COLONOSCOPY     ESOPHAGEAL MANOMETRY  05/28/2006   EYE SURGERY Right    tear duct repair   NASAL SINUS SURGERY     TONSILLECTOMY     TRIGGER FINGER RELEASE Right 11/06/2017   Procedure: RELEASE TRIGGER FINGER/A-1 PULLEY RIGHT INDEX FINGER AND RIGHT MIDDLE FINGER;  Surgeon:  Leanora Cover, MD;  Location: [place];  Service: Orthopedics;  Laterality: Right;   UPPER GASTROINTESTINAL ENDOSCOPY  10/18/2013   with dilation; with Propofol   UPPER GASTROINTESTINAL ENDOSCOPY  10/30/2016    There were no vitals filed for this visit.   Subjective Assessment - 03/19/22 1520     Subjective Reports that sitting for any period of time causes stiffness.    Pertinent History hip bursitis bilaterally (injections helped)    Limitations Standing;Walking;House hold activities;Lifting    How long can you stand comfortably? 20-30 minutes    Patient Stated Goals reduced pain, stand longer, clean    Currently in Pain? No/denies                [facility] PT Assessment - 03/19/22 0001       Assessment   Medical Diagnosis Other chronic pain    Referring Provider (PT) Ellene Route, MD    Next MD Visit as needed    Prior Therapy No      Precautions   Precautions None                           [facility] Adult  PT Treatment/Exercise - 03/19/22 0001       Lumbar Exercises: Aerobic   Nustep L5 x 15 minutes      Lumbar Exercises: Seated   Sit to Stand 15 reps;Limitations    Sit to Stand Limitations with hip abductor training; yellow theraband      Knee/Hip Exercises: Standing   Hip Flexion Stengthening;Both;15 reps;Knee bent    Hip Flexion Limitations yellow theraband    Other Standing Knee Exercises sidestepping yellow theraband x3 RT      Knee/Hip Exercises: Seated   Sit to Sand 10 reps;without UE support   LLE with R toe touch                Balance Exercises - 03/19/22 0001       Balance Exercises: Standing   Standing Eyes Opened Narrow base of support (BOS);Foam/compliant surface;Time    Standing Eyes Opened Time 2    Tandem Stance Eyes open;Foam/compliant surface;Time    Tandem Stance Limitations 3    Standing, One Foot on a Step Eyes open;8 inch;10 secs;Limitations    Standing, One Foot on a Step Limitations alternating     Step Ups Forward;6 inch;Intermittent UE support    Turning Both;10 reps                     PT Long Term Goals - 02/19/22 1406       PT LONG TERM GOAL #1   Title Patient will be independent with her HEP.    Time 6    Period Weeks    Status New    Target Date 04/02/22      PT LONG TERM GOAL #2   Title Patient will be able to complete her daily activities without her familiar lumbar pain exceeding 5/10.    Time 6    Period Weeks    Status New    Target Date 04/02/22      PT LONG TERM GOAL #3   Title Patient will be able to lift at least 8 pounds overhead without being limited by her cervical symptoms for improved function with household activities.    Time 6    Period Weeks    Status New    Target Date 04/02/22      PT LONG TERM GOAL #4   Title Patient will be able to stand at least 45 minutes without being limited by her familiar pain for improved ability cleaning.    Time 6    Period Weeks    Status New    Target Date 04/02/22      PT LONG TERM GOAL #5   Title Patient will be able to navigate at least 4 steps with a reciprical pattern for improved household navigation.    Time 6    Period Weeks    Status New    Target Date 04/02/22                   Plan - 03/19/22 1626     Clinical Impression Statement Patient presented in clinic with reports of more stiffness if sitting for any length of time. More focus on LLE for strengthening as patient reports that LLE felt weaker than RLE. Patient indicated that L forward steps were harder than RLE. Less UE support indicated with all balance activities.    Personal Factors and Comorbidities Time since onset of injury/illness/exacerbation;Other    Examination-Activity Limitations Caring for Others;Locomotion Level;Transfers    Examination-Participation Restrictions Cleaning;Community  Activity;Yard Work    [employer]chant [employer] officer Evolving/Moderate complexity    Rehab Potential Good    PT  Frequency 2x / week    PT Duration 6 weeks    PT Treatment/Interventions ADLs/Self Care Home Management;Cryotherapy;Electrical Stimulation;Moist Heat;Traction;Neuromuscular re-education;Therapeutic exercise;Therapeutic activities;Functional mobility training;Patient/family education;Stair training;Manual techniques;Dry needling;Taping    PT Next Visit Plan assess goals, as able, and balance interventions for improved lumbar and lower extremity stability    PT Home Exercise Plan Access Code: [email or web address]  URL: [email or web address]  Date: 02/19/2022  Prepared by: Jacqulynn Cadet    Exercises  - Seated Scapular Retraction  - 2 x daily - 7 x weekly - 2 sets - 10 reps  - Standing Scapular Depression  - 2 x daily - 7 x weekly - 2 sets - 10 reps  - Slouch Overcorrect on Swiss Ball  - 2 x daily - 7 x weekly - 2 sets - 10 reps    Consulted and Agree with Plan of Care Patient             Patient will benefit from skilled therapeutic intervention in order to improve the following deficits and impairments:  Decreased range of motion, Difficulty walking, Pain, Decreased activity tolerance, Hypomobility, Decreased strength, Decreased mobility  Visit Diagnosis: Other low back pain  Cervicalgia     Problem List Patient Active Problem List   Diagnosis Date Noted   Plantar fasciitis of right foot 02/27/2019   Bilateral hearing loss 06/24/2016   Rhinitis, chronic 06/24/2016   Internal hemorrhoids 02/05/2013   IBS (irritable bowel syndrome) 12/05/2011   Anxiety disorder 12/05/2011   GERD (gastroesophageal reflux disease) 12/05/2011   IBS 08/16/2010   DIAB W/UNS COMP TYPE II/UNS NOT STATED UNCNTRL 12/02/2008   GENERALIZED ANXIETY DISORDER 12/02/2008   GERD 12/02/2008   DYSPHAGIA 12/02/2008   HYPERLIPIDEMIA 01/22/2008   IRRITABLE BOWEL SYNDROME, HX OF 01/22/2008   ULCER OF ESOPHAGUS WITHOUT BLEEDING 05/12/2006   GASTRITIS 05/12/2006   COLITIS 12/14/2003   Rationale for Evaluation and  Treatment Rehabilitation   Standley Brooking, PTA 03/19/2022, 4:34 PM  [facility] [place], [place], [address] Phone: [phone]   Fax:  [phone]  Name: Adrienne [name] MRN: [id number] Date of Birth: 1951/06/29

## 2022-03-21 ENCOUNTER — Ambulatory Visit: Payer: PPO

## 2022-03-21 DIAGNOSIS — E119 Type 2 diabetes mellitus without complications: Secondary | ICD-10-CM | POA: Diagnosis not present

## 2022-03-21 DIAGNOSIS — M5459 Other low back pain: Secondary | ICD-10-CM

## 2022-03-21 DIAGNOSIS — M542 Cervicalgia: Secondary | ICD-10-CM

## 2022-03-21 NOTE — Therapy (Signed)
[facility] [place], [place], [address] Phone: [phone]   Fax:  [phone]  Physical Therapy Treatment  Patient Details  Name: Adrienne [name] MRN: [id number] Date of Birth: 27-Nov-1950 Referring Provider (PT): Ellene Route, MD   Encounter Date: 03/21/2022   PT End of Session - 03/21/22 1505     Visit Number 9    Number of Visits 12    Date for PT Re-Evaluation 04/26/22    PT Start Time 1430    PT Stop Time 1515    PT Time Calculation (min) 45 min    Activity Tolerance Patient tolerated treatment well    Behavior During Therapy [facility] for tasks assessed/performed             Past Medical History:  Diagnosis Date   Allergy    Arthritis    hands/fingers   Dental crowns present    Depression    Difficulty swallowing solids    due to esophageal stricture   Eczema    Esophageal reflux    Esophageal stricture    Family history of adverse reaction to anesthesia    pt's mother had hx. of N/V and being hard to wake up post-op   Generalized anxiety disorder    GERD (gastroesophageal reflux disease)    Hyperlipidemia    Hypertension    states under control with med., has been on med. x 30 yr.   Insulin dependent diabetes mellitus    Irritable bowel syndrome    Nutcracker esophagus    Seasonal allergies    Trigger finger of right hand 09/2017   index and middle fingers   Wears hearing aid in both ears     Past Surgical History:  Procedure Laterality Date   CARPAL TUNNEL RELEASE Left 01/07/2014   Procedure: LEFT CARPAL TUNNEL RELEASE;  Surgeon: Tennis Must, MD;  Location: [place];  Service: Orthopedics;  Laterality: Left;   COLONOSCOPY     ESOPHAGEAL MANOMETRY  05/28/2006   EYE SURGERY Right    tear duct repair   NASAL SINUS SURGERY     TONSILLECTOMY     TRIGGER FINGER RELEASE Right 11/06/2017   Procedure: RELEASE TRIGGER FINGER/A-1 PULLEY RIGHT INDEX FINGER AND RIGHT MIDDLE FINGER;  Surgeon:  Leanora Cover, MD;  Location: [place];  Service: Orthopedics;  Laterality: Right;   UPPER GASTROINTESTINAL ENDOSCOPY  10/18/2013   with dilation; with Propofol   UPPER GASTROINTESTINAL ENDOSCOPY  10/30/2016    There were no vitals filed for this visit.   Subjective Assessment - 03/21/22 1432     Subjective Patient reports that her back still feels stiff if she sits for a long period of time.    Pertinent History hip bursitis bilaterally (injections helped)    Limitations Standing;Walking;House hold activities;Lifting    How long can you stand comfortably? 20-30 minutes    Patient Stated Goals reduced pain, stand longer, clean    Currently in Pain? No/denies                               [facility] Adult PT Treatment/Exercise - 03/21/22 0001       Lumbar Exercises: Stretches   Other Lumbar Stretch Exercise Ball roll out   3 minutes     Lumbar Exercises: Aerobic   Nustep L5 x 15 minutes      Lumbar Exercises: Standing   Shoulder Extension Both  2 minutes   Shoulder Extension Limitations Blue XTS      Knee/Hip Exercises: Standing   Hip Flexion Both;Knee bent   standing on BOSU (ball up); 2 minutes   Hip Extension Both;Knee straight   2 minutes   Extension Limitations green t-band at ankles    Lateral Step Up Both;Hand Hold: 2;Step Height: 6"   2 minutes   Wall Squat 20 reps   with red ball behind her back   Other Standing Knee Exercises Side stepping and monster walks   2 minutes; green t-band                         PT Long Term Goals - 02/19/22 1406       PT LONG TERM GOAL #1   Title Patient will be independent with her HEP.    Time 6    Period Weeks    Status New    Target Date 04/02/22      PT LONG TERM GOAL #2   Title Patient will be able to complete her daily activities without her familiar lumbar pain exceeding 5/10.    Time 6    Period Weeks    Status New    Target Date 04/02/22      PT LONG TERM GOAL #3    Title Patient will be able to lift at least 8 pounds overhead without being limited by her cervical symptoms for improved function with household activities.    Time 6    Period Weeks    Status New    Target Date 04/02/22      PT LONG TERM GOAL #4   Title Patient will be able to stand at least 45 minutes without being limited by her familiar pain for improved ability cleaning.    Time 6    Period Weeks    Status New    Target Date 04/02/22      PT LONG TERM GOAL #5   Title Patient will be able to navigate at least 4 steps with a reciprical pattern for improved household navigation.    Time 6    Period Weeks    Status New    Target Date 04/02/22                   Plan - 03/21/22 1508     Clinical Impression Statement Patient was progressed with multiple new and familiar interventions for improved lower extremity strength needed for functional activities. She required minimal cueing with resisted hip extension to prevent lumbar flexion to isolate gluteal and posterior chain engagement. She reported that her back felt better upon the conclusion of treatment. She continues to require skilled physical therapy to address her remaining impairments to maximize her functional mobility.    Personal Factors and Comorbidities Time since onset of injury/illness/exacerbation;Other    Examination-Activity Limitations Caring for Others;Locomotion Level;Transfers    Examination-Participation Restrictions Cleaning;Community Activity;Yard Work    [employer]chant [employer] officer Evolving/Moderate complexity    Rehab Potential Good    PT Frequency 2x / week    PT Duration 6 weeks    PT Treatment/Interventions ADLs/Self Care Home Management;Cryotherapy;Electrical Stimulation;Moist Heat;Traction;Neuromuscular re-education;Therapeutic exercise;Therapeutic activities;Functional mobility training;Patient/family education;Stair training;Manual techniques;Dry needling;Taping    PT Next Visit Plan  assess goals, as able, and balance interventions for improved lumbar and lower extremity stability    PT Home Exercise Plan Access Code: [email or web address]  URL: [email or web address]  Date: 02/19/2022  Prepared by: Jacki Cones  Royalti Schauf    Exercises  - Seated Scapular Retraction  - 2 x daily - 7 x weekly - 2 sets - 10 reps  - Standing Scapular Depression  - 2 x daily - 7 x weekly - 2 sets - 10 reps  - Slouch Overcorrect on Swiss Ball  - 2 x daily - 7 x weekly - 2 sets - 10 reps    Consulted and Agree with Plan of Care Patient             Patient will benefit from skilled therapeutic intervention in order to improve the following deficits and impairments:  Decreased range of motion, Difficulty walking, Pain, Decreased activity tolerance, Hypomobility, Decreased strength, Decreased mobility  Visit Diagnosis: Other low back pain  Cervicalgia     Problem List Patient Active Problem List   Diagnosis Date Noted   Plantar fasciitis of right foot 02/27/2019   Bilateral hearing loss 06/24/2016   Rhinitis, chronic 06/24/2016   Internal hemorrhoids 02/05/2013   IBS (irritable bowel syndrome) 12/05/2011   Anxiety disorder 12/05/2011   GERD (gastroesophageal reflux disease) 12/05/2011   IBS 08/16/2010   DIAB W/UNS COMP TYPE II/UNS NOT STATED UNCNTRL 12/02/2008   GENERALIZED ANXIETY DISORDER 12/02/2008   GERD 12/02/2008   DYSPHAGIA 12/02/2008   HYPERLIPIDEMIA 01/22/2008   IRRITABLE BOWEL SYNDROME, HX OF 01/22/2008   ULCER OF ESOPHAGUS WITHOUT BLEEDING 05/12/2006   GASTRITIS 05/12/2006   COLITIS 12/14/2003   Rationale for Evaluation and Treatment Rehabilitation   Darlin Coco, PT 03/21/2022, 3:35 PM  [facility] [place], [place], [address] Phone: [phone]   Fax:  [phone]  Name: Adrienne [name] MRN: [id number] Date of Birth: 02/12/1951

## 2022-03-25 ENCOUNTER — Ambulatory Visit: Payer: PPO

## 2022-03-25 DIAGNOSIS — M542 Cervicalgia: Secondary | ICD-10-CM

## 2022-03-25 DIAGNOSIS — M5459 Other low back pain: Secondary | ICD-10-CM | POA: Diagnosis not present

## 2022-03-27 ENCOUNTER — Ambulatory Visit: Payer: PPO

## 2022-03-27 DIAGNOSIS — M5459 Other low back pain: Secondary | ICD-10-CM

## 2022-03-27 DIAGNOSIS — M542 Cervicalgia: Secondary | ICD-10-CM

## 2022-03-27 NOTE — Therapy (Signed)
[facility] [place], [place], [address] Phone: [phone]   Fax:  [phone]  Physical Therapy Treatment  Patient Details  Name: Adrienne [name] MRN: [id number] Date of Birth: 1951-06-25 Referring Provider (PT): Ellene Route, MD   Encounter Date: 03/27/2022   PT End of Session - 03/27/22 1402     Visit Number 11    Number of Visits 12    Date for PT Re-Evaluation 04/26/22    PT Start Time 9678    PT Stop Time 1430    PT Time Calculation (min) 45 min    Activity Tolerance Patient tolerated treatment well    Behavior During Therapy [facility] for tasks assessed/performed             Past Medical History:  Diagnosis Date   Allergy    Arthritis    hands/fingers   Dental crowns present    Depression    Difficulty swallowing solids    due to esophageal stricture   Eczema    Esophageal reflux    Esophageal stricture    Family history of adverse reaction to anesthesia    pt's mother had hx. of N/V and being hard to wake up post-op   Generalized anxiety disorder    GERD (gastroesophageal reflux disease)    Hyperlipidemia    Hypertension    states under control with med., has been on med. x 30 yr.   Insulin dependent diabetes mellitus    Irritable bowel syndrome    Nutcracker esophagus    Seasonal allergies    Trigger finger of right hand 09/2017   index and middle fingers   Wears hearing aid in both ears     Past Surgical History:  Procedure Laterality Date   CARPAL TUNNEL RELEASE Left 01/07/2014   Procedure: LEFT CARPAL TUNNEL RELEASE;  Surgeon: Tennis Must, MD;  Location: [place];  Service: Orthopedics;  Laterality: Left;   COLONOSCOPY     ESOPHAGEAL MANOMETRY  05/28/2006   EYE SURGERY Right    tear duct repair   NASAL SINUS SURGERY     TONSILLECTOMY     TRIGGER FINGER RELEASE Right 11/06/2017   Procedure: RELEASE TRIGGER FINGER/A-1 PULLEY RIGHT INDEX FINGER AND RIGHT MIDDLE FINGER;   Surgeon: Leanora Cover, MD;  Location: [place];  Service: Orthopedics;  Laterality: Right;   UPPER GASTROINTESTINAL ENDOSCOPY  10/18/2013   with dilation; with Propofol   UPPER GASTROINTESTINAL ENDOSCOPY  10/30/2016    There were no vitals filed for this visit.   Subjective Assessment - 03/27/22 1354     Subjective Patient reports that her back is still a little stiff from prolonged sitting yesterday.    Pertinent History hip bursitis bilaterally (injections helped)    Limitations Standing;Walking;House hold activities;Lifting    How long can you stand comfortably? 20-30 minutes    Patient Stated Goals reduced pain, stand longer, clean    Currently in Pain? Yes    Pain Score 4     Pain Location Back                               [facility] Adult PT Treatment/Exercise - 03/27/22 0001       Lumbar Exercises: Aerobic   Nustep L4-6 x 17 minutes      Lumbar Exercises: Standing   Other Standing Lumbar Exercises Open books   3 minutes     Lumbar  Exercises: Seated   Other Seated Lumbar Exercises Flexion with rotation   2 minutes     Knee/Hip Exercises: Standing   Lateral Step Up Both;Hand Hold: 2;Step Height: 6"   2 minutes   Forward Step Up Both;Hand Hold: 2;Step Height: 6"   3 minutes   Step Down Both;10 reps;Hand Hold: 2;Step Height: 6"      Knee/Hip Exercises: Seated   Sit to Sand without UE support;Other (comment)   2 minutes                Balance Exercises - 03/27/22 0001       Balance Exercises: Standing   Marching Foam/compliant surface;Upper extremity assist 2;Static   3 minutes; BOSU (ball up)                    PT Long Term Goals - 03/25/22 1453       PT LONG TERM GOAL #1   Title Patient will be independent with her HEP.    Baseline Patient reports that she is doing her HEP about 3 days per week    Time 6    Period Weeks    Status Partially Met    Target Date 04/02/22      PT LONG TERM GOAL #2   Title  Patient will be able to complete her daily activities without her familiar lumbar pain exceeding 5/10.    Baseline 4/10 at worst    Time 6    Period Weeks    Status Achieved    Target Date 04/02/22      PT LONG TERM GOAL #3   Title Patient will be able to lift at least 8 pounds overhead without being limited by her cervical symptoms for improved function with household activities.    Time 6    Period Weeks    Status Achieved    Target Date 04/02/22      PT LONG TERM GOAL #4   Title Patient will be able to stand at least 45 minutes without being limited by her familiar pain for improved ability cleaning.    Baseline not limited    Time 6    Period Weeks    Status Achieved    Target Date 04/02/22      PT LONG TERM GOAL #5   Title Patient will be able to navigate at least 4 steps with a reciprical pattern for improved household navigation.    Baseline reciprical pattern descending and step to ascending; hip is primary limiting factor with stiars    Time 6    Period Weeks    Status On-going    Target Date 04/02/22                   Plan - 03/27/22 1403     Clinical Impression Statement Patient was progressed with step downs and other familiar interventions for improved function with her daily activities. She required minimal cueing with these interventions for proper exercise performance to avoid compensations. She experienced no significant pain or discomfort with any of today's interventions. She reported feeling tired upon the conclusion of treatment. She continues to require skilled physical therapy to address her remaining impairments to maximize her functional mobility.    Personal Factors and Comorbidities Time since onset of injury/illness/exacerbation;Other    Examination-Activity Limitations Caring for Others;Locomotion Level;Transfers    Examination-Participation Restrictions Cleaning;Community Activity;Yard Work    [employer] officer  Evolving/Moderate complexity    Rehab Potential Good  PT Frequency 2x / week    PT Duration 6 weeks    PT Treatment/Interventions ADLs/Self Care Home Management;Cryotherapy;Electrical Stimulation;Moist Heat;Traction;Neuromuscular re-education;Therapeutic exercise;Therapeutic activities;Functional mobility training;Patient/family education;Stair training;Manual techniques;Dry needling;Taping    PT Next Visit Plan assess goals, as able, and balance interventions for improved lumbar and lower extremity stability    PT Home Exercise Plan Access Code: [email or web address]  URL: [email or web address]  Date: 02/19/2022  Prepared by: Jacqulynn Cadet    Exercises  - Seated Scapular Retraction  - 2 x daily - 7 x weekly - 2 sets - 10 reps  - Standing Scapular Depression  - 2 x daily - 7 x weekly - 2 sets - 10 reps  - Slouch Overcorrect on Swiss Ball  - 2 x daily - 7 x weekly - 2 sets - 10 reps    Consulted and Agree with Plan of Care Patient             Patient will benefit from skilled therapeutic intervention in order to improve the following deficits and impairments:  Decreased range of motion, Difficulty walking, Pain, Decreased activity tolerance, Hypomobility, Decreased strength, Decreased mobility  Visit Diagnosis: Other low back pain  Cervicalgia     Problem List Patient Active Problem List   Diagnosis Date Noted   Plantar fasciitis of right foot 02/27/2019   Bilateral hearing loss 06/24/2016   Rhinitis, chronic 06/24/2016   Internal hemorrhoids 02/05/2013   IBS (irritable bowel syndrome) 12/05/2011   Anxiety disorder 12/05/2011   GERD (gastroesophageal reflux disease) 12/05/2011   IBS 08/16/2010   DIAB W/UNS COMP TYPE II/UNS NOT STATED UNCNTRL 12/02/2008   GENERALIZED ANXIETY DISORDER 12/02/2008   GERD 12/02/2008   DYSPHAGIA 12/02/2008   HYPERLIPIDEMIA 01/22/2008   IRRITABLE BOWEL SYNDROME, HX OF 01/22/2008   ULCER OF ESOPHAGUS WITHOUT BLEEDING 05/12/2006   GASTRITIS  05/12/2006   COLITIS 12/14/2003   Rationale for Evaluation and Treatment Rehabilitation   Darlin Coco, PT 03/27/2022, 3:06 PM  [facility] [place], [place], [address] Phone: [phone]   Fax:  [phone]  Name: BRANDOLYN SHORTRIDGE MRN: [id number] Date of Birth: [birth year]

## 2022-04-03 ENCOUNTER — Encounter: Payer: Self-pay | Admitting: *Deleted

## 2022-04-03 ENCOUNTER — Ambulatory Visit: Payer: PPO | Attending: Neurological Surgery | Admitting: *Deleted

## 2022-04-03 DIAGNOSIS — M5459 Other low back pain: Secondary | ICD-10-CM | POA: Insufficient documentation

## 2022-04-03 NOTE — Therapy (Addendum)
OUTPATIENT PHYSICAL THERAPY TREATMENT NOTE   Patient Name: Adrienne [name] MRN: [id number] DOB:[birth year], 71 y.o., female Today's Date: 04/03/2022   REFERRING PROVIDER: Ellene Route, MD   PT End of Session - 04/03/22 1435     Visit Number 12    Number of Visits 12    Date for PT Re-Evaluation 04/26/22    PT Start Time 1432    PT Stop Time 1518    PT Time Calculation (min) 46 min             Past Medical History:  Diagnosis Date   Allergy    Arthritis    hands/fingers   Dental crowns present    Depression    Difficulty swallowing solids    due to esophageal stricture   Eczema    Esophageal reflux    Esophageal stricture    Family history of adverse reaction to anesthesia    pt's mother had hx. of N/V and being hard to wake up post-op   Generalized anxiety disorder    GERD (gastroesophageal reflux disease)    Hyperlipidemia    Hypertension    states under control with med., has been on med. x 30 yr.   Insulin dependent diabetes mellitus    Irritable bowel syndrome    Nutcracker esophagus    Seasonal allergies    Trigger finger of right hand 09/2017   index and middle fingers   Wears hearing aid in both ears    Past Surgical History:  Procedure Laterality Date   CARPAL TUNNEL RELEASE Left 01/07/2014   Procedure: LEFT CARPAL TUNNEL RELEASE;  Surgeon: Tennis Must, MD;  Location: [place];  Service: Orthopedics;  Laterality: Left;   COLONOSCOPY     ESOPHAGEAL MANOMETRY  05/28/2006   EYE SURGERY Right    tear duct repair   NASAL SINUS SURGERY     TONSILLECTOMY     TRIGGER FINGER RELEASE Right 11/06/2017   Procedure: RELEASE TRIGGER FINGER/A-1 PULLEY RIGHT INDEX FINGER AND RIGHT MIDDLE FINGER;  Surgeon: Leanora Cover, MD;  Location: [place];  Service: Orthopedics;  Laterality: Right;   UPPER GASTROINTESTINAL ENDOSCOPY  10/18/2013   with dilation; with Propofol   UPPER GASTROINTESTINAL ENDOSCOPY  10/30/2016   Patient Active  Problem List   Diagnosis Date Noted   Plantar fasciitis of right foot 02/27/2019   Bilateral hearing loss 06/24/2016   Rhinitis, chronic 06/24/2016   Internal hemorrhoids 02/05/2013   IBS (irritable bowel syndrome) 12/05/2011   Anxiety disorder 12/05/2011   GERD (gastroesophageal reflux disease) 12/05/2011   IBS 08/16/2010   DIAB W/UNS COMP TYPE II/UNS NOT STATED UNCNTRL 12/02/2008   GENERALIZED ANXIETY DISORDER 12/02/2008   GERD 12/02/2008   DYSPHAGIA 12/02/2008   HYPERLIPIDEMIA 01/22/2008   IRRITABLE BOWEL SYNDROME, HX OF 01/22/2008   ULCER OF ESOPHAGUS WITHOUT BLEEDING 05/12/2006   GASTRITIS 05/12/2006   COLITIS 12/14/2003    REFERRING DIAG:   Other chronic pain     THERAPY DIAG:  Other low back pain  Rationale for Evaluation and Treatment Rehabilitation  PERTINENT HISTORY:   hip bursitis bilaterally     PRECAUTIONS:   Other chronic pain     SUBJECTIVE:  Did okay after last Rx. 25% overall better. LT hip started hurting again  PAIN:  Are you having pain? Yes: NPRS scale: 3/10 Pain location: LB Pain description: sore/sharp Aggravating factors: sweeping, washing dishes, walking     Relieving factors: sitting     TODAY'S TREATMENT:  EXERCISE LOG  Exercise Repetitions and Resistance Comments  Nustep L5 x 16 mins   Bridging X10 hold 5secs   Dying bug X6 each side hold 5 secs   Sidelying hip clam 2x10-15 both sides        Blank cell = exercise not performed today     HOME EXERCISE PROGRAM: Handout given for HEP of exs performed today                                   Bridging  Dying bug  Sidelying hip clam     PT Long Term Goals - 04/03/22 1436       PT LONG TERM GOAL #1   Title Patient will be independent with her HEP.    Baseline Patient reports that she is doing her HEP about 3 days per week    Time 6    Period Weeks    Status Partially Met    Target Date 04/02/22      PT LONG TERM GOAL #2   Title  Patient will be able to complete her daily activities without her familiar lumbar pain exceeding 5/10.    Baseline 4/10 at worst    Time 6    Period Weeks    Status Achieved    Target Date 04/02/22      PT LONG TERM GOAL #3   Title Patient will be able to lift at least 8 pounds overhead without being limited by her cervical symptoms for improved function with household activities.    Time 6    Period Weeks    Status Achieved    Target Date 04/02/22      PT LONG TERM GOAL #4   Title Patient will be able to stand at least 45 minutes without being limited by her familiar pain for improved ability cleaning.    Baseline not limited    Time 6    Period Weeks    Status Achieved    Target Date 04/02/22      PT LONG TERM GOAL #5   Title Patient will be able to navigate at least 4 steps with a reciprical pattern for improved household navigation.    Baseline reciprical pattern descending and step to ascending; hip is primary limiting factor with stiars    Time 6    Period Weeks    Status On-going    Target Date 04/02/22              Plan - 04/03/22 1434     Clinical Impression Statement Pt arrived today doing fairly well with pain 3-4/10 LB, but reports LT hip is hurting again. Rx focused on core stabilization exs for HEP. Pt progressing and feels she would like 6 more Rxs for HEP progression of strength and balance    Personal Factors and Comorbidities Time since onset of injury/illness/exacerbation;Other    Examination-Participation Restrictions Cleaning;Community Activity;Yard Work    [employer]chant [employer] officer Evolving/Moderate complexity    Rehab Potential Good    PT Frequency 2x / week    PT Duration 6 weeks    PT Treatment/Interventions ADLs/Self Care Home Management;Cryotherapy;Electrical Stimulation;Moist Heat;Traction;Neuromuscular re-education;Therapeutic exercise;Therapeutic activities;Functional mobility training;Patient/family education;Stair training;Manual  techniques;Dry needling;Taping    PT Next Visit Plan assess goals, as able, and balance interventions for improved lumbar and lower extremity stability.  Recert 6 more visits?    Consulted and Agree with Plan of Care Patient  Zabria Liss,CHRIS, PTA 04/03/2022, 3:36 PM    PHYSICAL THERAPY DISCHARGE SUMMARY  Visits from Start of Care: 12  Current functional level related to goals / functional outcomes: Patient was able to meet most of her goals for skilled physical therapy. She is being discharged at this time due to not returning following her last appointment.    Remaining deficits: Navigating stairs   Education / Equipment: HEP   Patient agrees to discharge. Patient goals were partially met. Patient is being discharged due to not returning since the last visit.  Jacqulynn Cadet, PT, DPT

## 2022-04-04 NOTE — Addendum Note (Signed)
Addended by: Darlin Coco on: 04/04/2022 05:41 PM   Modules accepted: Orders

## 2022-04-20 DIAGNOSIS — E119 Type 2 diabetes mellitus without complications: Secondary | ICD-10-CM | POA: Diagnosis not present

## 2022-05-02 DIAGNOSIS — J00 Acute nasopharyngitis [common cold]: Secondary | ICD-10-CM | POA: Diagnosis not present

## 2022-05-02 DIAGNOSIS — R197 Diarrhea, unspecified: Secondary | ICD-10-CM | POA: Diagnosis not present

## 2022-05-02 DIAGNOSIS — R0981 Nasal congestion: Secondary | ICD-10-CM | POA: Diagnosis not present

## 2022-05-02 DIAGNOSIS — R42 Dizziness and giddiness: Secondary | ICD-10-CM | POA: Diagnosis not present

## 2022-05-02 DIAGNOSIS — Z20822 Contact with and (suspected) exposure to covid-19: Secondary | ICD-10-CM | POA: Diagnosis not present

## 2022-05-21 DIAGNOSIS — E119 Type 2 diabetes mellitus without complications: Secondary | ICD-10-CM | POA: Diagnosis not present

## 2022-06-13 DIAGNOSIS — D1801 Hemangioma of skin and subcutaneous tissue: Secondary | ICD-10-CM | POA: Diagnosis not present

## 2022-06-13 DIAGNOSIS — L853 Xerosis cutis: Secondary | ICD-10-CM | POA: Diagnosis not present

## 2022-06-13 DIAGNOSIS — D692 Other nonthrombocytopenic purpura: Secondary | ICD-10-CM | POA: Diagnosis not present

## 2022-06-13 DIAGNOSIS — Z85828 Personal history of other malignant neoplasm of skin: Secondary | ICD-10-CM | POA: Diagnosis not present

## 2022-06-13 DIAGNOSIS — L814 Other melanin hyperpigmentation: Secondary | ICD-10-CM | POA: Diagnosis not present

## 2022-06-13 DIAGNOSIS — L821 Other seborrheic keratosis: Secondary | ICD-10-CM | POA: Diagnosis not present

## 2022-06-14 ENCOUNTER — Ambulatory Visit (INDEPENDENT_AMBULATORY_CARE_PROVIDER_SITE_OTHER): Payer: PPO | Admitting: Nurse Practitioner

## 2022-06-14 ENCOUNTER — Encounter: Payer: Self-pay | Admitting: Nurse Practitioner

## 2022-06-14 VITALS — BP 126/76 | HR 81 | Ht 61.0 in | Wt 148.0 lb

## 2022-06-14 DIAGNOSIS — K5900 Constipation, unspecified: Secondary | ICD-10-CM | POA: Diagnosis not present

## 2022-06-14 DIAGNOSIS — R1310 Dysphagia, unspecified: Secondary | ICD-10-CM | POA: Diagnosis not present

## 2022-06-14 DIAGNOSIS — K580 Irritable bowel syndrome with diarrhea: Secondary | ICD-10-CM

## 2022-06-14 NOTE — Patient Instructions (Signed)
Please purchase the following medications over the counter and take as directed: Benefiber 1 tablespoon daily dissolved in at least 8 ounces water/juice. Use as tolerated.  You may take Miralax 9 grams (1/2 capful) dissolved in at least 8 ounces water/juice if no bowel movement within a 2-3 day period.  You have been scheduled for a Barium Esophogram at [facility] (1st floor of the hospital) on 06/24/22 at 10:00 am. Please arrive 15 minutes prior to your appointment for registration. Make certain not to have anything to eat or drink 3 hours prior to your test. If you need to reschedule for any reason, please contact radiology at [phone] to do so. ________________________________________________________  A barium swallow is an examination that concentrates on views of the esophagus. This tends to be a double contrast exam (barium and two liquids which, when combined, create a gas to distend the wall of the oesophagus) or single contrast (non-ionic iodine based). The study is usually tailored to your symptoms so a good history is essential. Attention is paid during the study to the form, structure and configuration of the esophagus, looking for functional disorders (such as aspiration, dysphagia, achalasia, motility and reflux) EXAMINATION You may be asked to change into a gown, depending on the type of swallow being performed. A radiologist and radiographer will perform the procedure. The radiologist will advise you of the type of contrast selected for your procedure and direct you during the exam. You will be asked to stand, sit or lie in several different positions and to hold a small amount of fluid in your mouth before being asked to swallow while the imaging is performed .In some instances you may be asked to swallow barium coated marshmallows to assess the motility of a solid food bolus. The exam can be recorded as a digital or video fluoroscopy procedure. POST PROCEDURE It will  take 1-2 days for the barium to pass through your system. To facilitate this, it is important, unless otherwise directed, to increase your fluids for the next 24-48hrs and to resume your normal diet.  This test typically takes about 30 minutes to perform. ________________________________________________________   If you are age 77 or older, your body mass index should be between 23-30. Your Body mass index is 27.96 kg/m. If this is out of the aforementioned range listed, please consider follow up with your Primary Care Provider.  If you are age 65 or younger, your body mass index should be between 19-25. Your Body mass index is 27.96 kg/m. If this is out of the aformentioned range listed, please consider follow up with your Primary Care Provider.   ________________________________________________________  The [place] [facility] providers would like to encourage you to use [facility] to communicate with providers for non-urgent requests or questions.  Due to long hold times on the telephone, sending your provider a message by [facility] may be a faster and more efficient way to get a response.  Please allow 48 business hours for a response.  Please remember that this is for non-urgent requests.  _______________________________________________________  Due to recent changes in healthcare laws, you may see the results of your imaging and laboratory studies on MyChart before your provider has had a chance to review them.  We understand that in some cases there may be results that are confusing or concerning to you. Not all laboratory results come back in the same time frame and the provider may be waiting for multiple results in order to interpret others.  Please give [place]  48 hours in order for your provider to thoroughly review all the results before contacting the office for clarification of your results.

## 2022-06-14 NOTE — Progress Notes (Signed)
06/14/2022 Adrienne [name] [id number] 05-01-1951   Chief Complaint: Difficulty swallowing, IBS  History of Present Illness: Adrienne [name] is a 71 year old female with a past medial history of anxiety, DM II, GERD, esophageal dysmotility and IBS-D. She is followed by Dr. Hilarie Fredrickson.  She continues to have dysphagia symptoms.  She describes having food which gets stuck to the throat/upper esophagus which occurs daily mostly during dinnertime but sometimes earlier in the day.  She ate a [employer] hamburger yesterday evening which briefly got stuck, she drank water and the food passed down the esophagus.  This morning, she ate [employer] chicken biscuit which briefly got stuck and passed after she drank water.  Episodes of dysphagia occur with all foods, not just [employer].  Eating small bits of foods or snacks like peanut M & Ms result in coughing. She denies having any heartburn.  No upper abdominal pain.  She takes Omeprazole 20 mg OTC once daily.  Her most recent EGD was 04/03/2021 which was normal, the esophagus was empirically dilated.  She stated her dysphagia symptoms improved for approximately 4 weeks post EGD then recurred.  She has diarrhea predominant IBS which was fairly persistent from spring 2023 until 05/31/2022.  Her sister was ill and died Easter sunday which was extremely stressful.  She acknowledges stress and anxiety triggered her diarrhea.  She was prescribed Lomotil by her PCP which she took prior to flight travel which decreased her diarrhea which she now takes as needed.  Imodium was ineffective when used in the past.  She was prescribed Tramadol 50 mg 1 p.o. every 6 hours for back pain which she takes once or twice daily or sometimes skips a day and since starting this medication her diarrhea abated.  She is passing a formed solid brown bowel movement every 4 to 5 days for the past 2 weeks.  No rectal bleeding or black stools.  She has infrequent lower abdominal pain.  Her most  recent colonoscopy was 04/03/2021 which identified a 3 mm tubular adenomatous polyp removed from the descending colon, no evidence of colitis.  She has undergone numerous endoscopies in the past and duodenal biopsies in 2011 were negative for celiac disease.  Labs 12/14/2021: WBC 9.02.  Hemoglobin 11.2.  Hematocrit 37.6.  MCV 79.8.  Platelet 336.  Hemoglobin A1c 6.0.  Glucose 77.  BUN 11.  Creatinine 0.7.  Sodium 143.  Potassium 4.3.  Albumin 3.9.  AST 16.  ALT 11.  Alk phos 69.  Total bili 0.4.  RECENT GI PROCEDURES:  EGD 04/03/2021: - Normal esophagus. - No endoscopic esophageal abnormality to explain patient's dysphagia. Esophagus dilated with 52 Fr Maloney. - Normal stomach. - Normal examined duodenum. - No specimens collected.  Colonoscopy 04/03/2021: - One 3 mm polyp in the descending colon, removed with a cold snare. Resected and retrieved. - The examination was otherwise normal. -No further colon polyp surveillance colonoscopies unless recommended by her PCP - TUBULAR ADENOMA (1 OF 1 FRAGMENTS) - NO HIGH-GRADE DYSPLASIA OR MALIGNANCY IDENTIFIED  Current Outpatient Medications on File Prior to Visit  Medication Sig Dispense Refill   ALPRAZolam (XANAX) 0.25 MG tablet TAKE 1 TABLET BY MOUTH ONCE DAILY AS NEEDED FOR ANXIETY OR SLEEP  0   amLODipine (NORVASC) 2.5 MG tablet Take 2.5 mg by mouth daily.     aspirin 81 MG chewable tablet Chew 1 tablet by mouth daily.     aspirin 81 MG tablet Take 81 mg by mouth daily.  azelastine (ASTELIN) 0.1 % nasal spray azelastine 137 mcg (0.1 %) nasal spray aerosol     calcium carbonate (OS-CAL) 600 MG TABS tablet Take 600 mg by mouth 2 (two) times daily with a meal.     Cetirizine HCl 10 MG CAPS Take by mouth.     clobetasol ointment (TEMOVATE) 0.05 % Apply 1 g topically as needed.     Cyanocobalamin 1000 MCG TBCR Take by mouth.     diphenoxylate-atropine (LOMOTIL) 2.5-0.025 MG tablet Take 2 tablets by mouth 4 (four) times daily as needed.      Dulaglutide 1.5 MG/0.5ML SOPN Inject 1.5 mg into the skin once a week. WEDNESDAY     empagliflozin (JARDIANCE) 25 MG TABS tablet Take 25 mg by mouth daily.     esomeprazole (NEXIUM) 40 MG capsule Take 40 mg by mouth daily.      fluticasone (FLONASE) 50 MCG/ACT nasal spray Place into the nose.     furosemide (LASIX) 20 MG tablet furosemide 20 mg tablet     imipramine (TOFRANIL) 25 MG tablet Take by mouth.     insulin glargine (LANTUS) 100 UNIT/ML injection Inject 40 Units into the skin daily.      isosorbide mononitrate (IMDUR) 30 MG 24 hr tablet Take 15 mg by mouth daily.     metFORMIN (GLUCOPHAGE) 1000 MG tablet Take 1,000 mg by mouth 2 (two) times daily with a meal.      metFORMIN (GLUCOPHAGE) 1000 MG tablet Take 1 tablet by mouth 2 (two) times daily.     Multiple Vitamin (MULTIVITAMIN) tablet Take 1 tablet by mouth daily.     ONETOUCH VERIO test strip daily.     ramipril (ALTACE) 10 MG tablet Take 10 mg by mouth daily.     simvastatin (ZOCOR) 40 MG tablet Take 40 mg by mouth every evening.     simvastatin (ZOCOR) 40 MG tablet Take 1 tablet by mouth every evening.     traMADol (ULTRAM) 50 MG tablet Take 50 mg by mouth every 6 (six) hours as needed.     venlafaxine XR (EFFEXOR-XR) 37.5 MG 24 hr capsule Take 1 capsule by mouth. DAILY     ibuprofen (ADVIL) 600 MG tablet Take 600 mg by mouth every 6 (six) hours as needed. (Patient not taking: Reported on 06/14/2022)     olanzapine-FLUoxetine (SYMBYAX) 6-25 MG per capsule Take 1 capsule by mouth every evening. (Patient not taking: Reported on 06/14/2022)     [DISCONTINUED] FLUoxetine (PROZAC) 40 MG capsule Take 40 mg by mouth daily.     [DISCONTINUED] rosiglitazone-metformin (AVANDAMET) 12-998 MG per tablet Take 1 tablet by mouth 2 (two) times daily with a meal.     Current Facility-Administered Medications on File Prior to Visit  Medication Dose Route Frequency Provider Last Rate Last Admin   dexamethasone (DECADRON) injection 2 mg  2 mg Other  Once Marzetta Board, DPM       Allergies  Allergen Reactions   Sulfa Antibiotics Rash    Current Medications, Allergies, Past Medical History, Past Surgical History, Family History and Social History were reviewed in [place] record.   Review of Systems:   Constitutional: Negative for fever, sweats, chills or weight loss.  Respiratory: Negative for shortness of breath.   Cardiovascular: Negative for chest pain, palpitations and leg swelling.  Gastrointestinal: See HPI.  Musculoskeletal: + Bilateral hip/knee pain, back pain.   Neurological: Negative for dizziness, headaches or paresthesias.    Physical Exam: BP 126/76  Pulse 81   Ht _0  (1.549 m)   Wt 148 lb (67.1 kg)   BMI 27.96 kg/m  General: 71 year old female in no acute distress. Head: Normocephalic and atraumatic. Eyes: No scleral icterus. Conjunctiva pink . Ears: Normal auditory acuity. Mouth: Dentition intact. No ulcers or lesions.  Lungs: Clear throughout to auscultation. Heart: Regular rate and rhythm, no murmur. Abdomen: Soft, nontender and nondistended. No masses or hepatomegaly. Normal bowel sounds x 4 quadrants.  Rectal: Deferred. Musculoskeletal: Symmetrical with no gross deformities. Extremities: No edema. Neurological: Alert oriented x 4. No focal deficits.  Psychological: Alert and cooperative. Normal mood and affect  Assessment and Recommendations:  80) 71 year old female with a history of GERD and recurrent dysphagia.  EGD 04/03/2021 showed a normal esophagus which was empirically dilated.  Dysphagia symptoms improved for approximately 4 weeks post EGD with dilatation then recurred. -Barium swallow with tablet -Repeat EGD with empiric esophageal dilatation versus esophageal manometry of barium swallow study unrevealing -Continue esomeprazole 20 mg once daily -Avoid fatty foods  -Further recommendations and follow-up to be determined after barium swallow results  reviewed  2) IBS-D.  Lomotil as needed significantly reduced diarrhea episodes.  No further diarrhea for the past 2 weeks associated with the initiation of tramadol.  No evidence of colitis per colonoscopy 03/2021.  Duodenal biopsies were negative for celiac disease per EGD in 2011.  Anxiety a contributing factor. -Consider SIBO testing if diarrhea recurs -Follow-up with PCP for anxiety management, consider seeing a therapist  3) Constipation x 2 weeks secondary to Tramadol -Benefiber 1 tablespoon daily -Take one half dose of MiraLAX nightly as needed, may need to take on days she also takes Tramadol  4) Mild normocytic anemia.  Hemoglobin 11.2. -Repeat CBC with iron studies in the next month if not done by PCP  Today's encounter was 25 minutes which included precharting, chart/result review, face-to-face time used for counseling, formulating treatment plan with follow-up and documentation.

## 2022-06-18 DIAGNOSIS — I209 Angina pectoris, unspecified: Secondary | ICD-10-CM | POA: Diagnosis not present

## 2022-06-18 DIAGNOSIS — R42 Dizziness and giddiness: Secondary | ICD-10-CM | POA: Diagnosis not present

## 2022-06-18 DIAGNOSIS — Z23 Encounter for immunization: Secondary | ICD-10-CM | POA: Diagnosis not present

## 2022-06-18 DIAGNOSIS — E7800 Pure hypercholesterolemia, unspecified: Secondary | ICD-10-CM | POA: Diagnosis not present

## 2022-06-18 DIAGNOSIS — F418 Other specified anxiety disorders: Secondary | ICD-10-CM | POA: Diagnosis not present

## 2022-06-18 DIAGNOSIS — M8580 Other specified disorders of bone density and structure, unspecified site: Secondary | ICD-10-CM | POA: Diagnosis not present

## 2022-06-18 DIAGNOSIS — Z794 Long term (current) use of insulin: Secondary | ICD-10-CM | POA: Diagnosis not present

## 2022-06-18 DIAGNOSIS — F3130 Bipolar disorder, current episode depressed, mild or moderate severity, unspecified: Secondary | ICD-10-CM | POA: Diagnosis not present

## 2022-06-18 DIAGNOSIS — I517 Cardiomegaly: Secondary | ICD-10-CM | POA: Diagnosis not present

## 2022-06-18 DIAGNOSIS — I10 Essential (primary) hypertension: Secondary | ICD-10-CM | POA: Diagnosis not present

## 2022-06-18 DIAGNOSIS — E119 Type 2 diabetes mellitus without complications: Secondary | ICD-10-CM | POA: Diagnosis not present

## 2022-06-18 DIAGNOSIS — D692 Other nonthrombocytopenic purpura: Secondary | ICD-10-CM | POA: Diagnosis not present

## 2022-06-21 DIAGNOSIS — E119 Type 2 diabetes mellitus without complications: Secondary | ICD-10-CM | POA: Diagnosis not present

## 2022-06-24 ENCOUNTER — Other Ambulatory Visit (HOSPITAL_COMMUNITY): Payer: PPO

## 2022-06-25 NOTE — Progress Notes (Addendum)
Addendum: Reviewed and agree with assessment and management plan. Albion Weatherholtz, Lajuan Lines, MD  Barium esophagram reviewed.  Dysmotility most favored. Dilation performed in 2022 only provided 4 weeks of relief which is much less than would be expected after [facility] dilation. Given very short interval of relief from dilation I would proceed with esophageal manometry which may help [place] determine if any other treatment options exist to improve swallowing dysfunction.

## 2022-07-02 ENCOUNTER — Ambulatory Visit (HOSPITAL_COMMUNITY)
Admission: RE | Admit: 2022-07-02 | Discharge: 2022-07-02 | Disposition: A | Discharge status: Home / Self Care | Payer: PPO | Source: Ambulatory Visit | Attending: Nurse Practitioner | Admitting: Nurse Practitioner

## 2022-07-02 DIAGNOSIS — R1310 Dysphagia, unspecified: Secondary | ICD-10-CM | POA: Diagnosis not present

## 2022-07-02 DIAGNOSIS — K224 Dyskinesia of esophagus: Secondary | ICD-10-CM | POA: Diagnosis not present

## 2022-07-04 DIAGNOSIS — M5116 Intervertebral disc disorders with radiculopathy, lumbar region: Secondary | ICD-10-CM | POA: Diagnosis not present

## 2022-07-04 DIAGNOSIS — M5416 Radiculopathy, lumbar region: Secondary | ICD-10-CM | POA: Diagnosis not present

## 2022-07-08 ENCOUNTER — Other Ambulatory Visit: Payer: Self-pay

## 2022-07-08 ENCOUNTER — Telehealth: Payer: Self-pay

## 2022-07-08 DIAGNOSIS — R1310 Dysphagia, unspecified: Secondary | ICD-10-CM

## 2022-07-08 NOTE — Telephone Encounter (Signed)
Pt was made aware of Dr. Hilarie Fredrickson recommendations:  Pt was ordered and scheduled for the Esophageal Manometry on 11/13/2022 at [facility]  at 12:30 PM: Pt to arrive at 12:00 PM Pt made aware: Case ID number [id number] Ambulatory referral to GI placed:  Prep instructions were sent to pt via my chart: Pt made aware:  Pt verbalized understanding with all questions answered.

## 2022-07-09 DIAGNOSIS — H9193 Unspecified hearing loss, bilateral: Secondary | ICD-10-CM | POA: Diagnosis not present

## 2022-07-09 DIAGNOSIS — H6993 Unspecified Eustachian tube disorder, bilateral: Secondary | ICD-10-CM | POA: Diagnosis not present

## 2022-07-09 DIAGNOSIS — R42 Dizziness and giddiness: Secondary | ICD-10-CM | POA: Diagnosis not present

## 2022-07-10 ENCOUNTER — Other Ambulatory Visit: Payer: Self-pay | Admitting: Otolaryngology

## 2022-07-10 DIAGNOSIS — R42 Dizziness and giddiness: Secondary | ICD-10-CM

## 2022-07-11 DIAGNOSIS — H903 Sensorineural hearing loss, bilateral: Secondary | ICD-10-CM | POA: Diagnosis not present

## 2022-07-12 DIAGNOSIS — M6281 Muscle weakness (generalized): Secondary | ICD-10-CM | POA: Diagnosis not present

## 2022-07-12 DIAGNOSIS — R42 Dizziness and giddiness: Secondary | ICD-10-CM | POA: Diagnosis not present

## 2022-07-12 DIAGNOSIS — Z9181 History of falling: Secondary | ICD-10-CM | POA: Diagnosis not present

## 2022-07-12 DIAGNOSIS — R2681 Unsteadiness on feet: Secondary | ICD-10-CM | POA: Diagnosis not present

## 2022-07-16 DIAGNOSIS — Z9181 History of falling: Secondary | ICD-10-CM | POA: Diagnosis not present

## 2022-07-16 DIAGNOSIS — M6281 Muscle weakness (generalized): Secondary | ICD-10-CM | POA: Diagnosis not present

## 2022-07-16 DIAGNOSIS — R42 Dizziness and giddiness: Secondary | ICD-10-CM | POA: Diagnosis not present

## 2022-07-16 DIAGNOSIS — R2681 Unsteadiness on feet: Secondary | ICD-10-CM | POA: Diagnosis not present

## 2022-07-21 DIAGNOSIS — E119 Type 2 diabetes mellitus without complications: Secondary | ICD-10-CM | POA: Diagnosis not present

## 2022-07-23 DIAGNOSIS — Z9181 History of falling: Secondary | ICD-10-CM | POA: Diagnosis not present

## 2022-07-23 DIAGNOSIS — R2681 Unsteadiness on feet: Secondary | ICD-10-CM | POA: Diagnosis not present

## 2022-07-23 DIAGNOSIS — M6281 Muscle weakness (generalized): Secondary | ICD-10-CM | POA: Diagnosis not present

## 2022-07-23 DIAGNOSIS — R42 Dizziness and giddiness: Secondary | ICD-10-CM | POA: Diagnosis not present

## 2022-07-25 DIAGNOSIS — M6281 Muscle weakness (generalized): Secondary | ICD-10-CM | POA: Diagnosis not present

## 2022-07-25 DIAGNOSIS — R42 Dizziness and giddiness: Secondary | ICD-10-CM | POA: Diagnosis not present

## 2022-07-25 DIAGNOSIS — R2681 Unsteadiness on feet: Secondary | ICD-10-CM | POA: Diagnosis not present

## 2022-07-25 DIAGNOSIS — Z9181 History of falling: Secondary | ICD-10-CM | POA: Diagnosis not present

## 2022-07-28 ENCOUNTER — Ambulatory Visit
Admission: RE | Admit: 2022-07-28 | Discharge: 2022-07-28 | Disposition: A | Discharge status: Home / Self Care | Payer: PPO | Source: Ambulatory Visit | Attending: Otolaryngology | Admitting: Otolaryngology

## 2022-07-28 DIAGNOSIS — R262 Difficulty in walking, not elsewhere classified: Secondary | ICD-10-CM | POA: Diagnosis not present

## 2022-07-28 DIAGNOSIS — R2681 Unsteadiness on feet: Secondary | ICD-10-CM | POA: Diagnosis not present

## 2022-07-28 DIAGNOSIS — R42 Dizziness and giddiness: Secondary | ICD-10-CM

## 2022-07-28 MED ORDER — GADOPICLENOL 0.5 MMOL/ML IV SOLN
7.0000 mL | Freq: Once | INTRAVENOUS | Status: AC | PRN
  Administered 2022-07-28: 7 mL via INTRAVENOUS

## 2022-07-30 DIAGNOSIS — R42 Dizziness and giddiness: Secondary | ICD-10-CM | POA: Diagnosis not present

## 2022-07-30 DIAGNOSIS — R2681 Unsteadiness on feet: Secondary | ICD-10-CM | POA: Diagnosis not present

## 2022-07-30 DIAGNOSIS — Z9181 History of falling: Secondary | ICD-10-CM | POA: Diagnosis not present

## 2022-07-30 DIAGNOSIS — M6281 Muscle weakness (generalized): Secondary | ICD-10-CM | POA: Diagnosis not present

## 2022-08-01 DIAGNOSIS — Z9181 History of falling: Secondary | ICD-10-CM | POA: Diagnosis not present

## 2022-08-01 DIAGNOSIS — R2681 Unsteadiness on feet: Secondary | ICD-10-CM | POA: Diagnosis not present

## 2022-08-01 DIAGNOSIS — M6281 Muscle weakness (generalized): Secondary | ICD-10-CM | POA: Diagnosis not present

## 2022-08-01 DIAGNOSIS — R42 Dizziness and giddiness: Secondary | ICD-10-CM | POA: Diagnosis not present

## 2022-08-06 DIAGNOSIS — R2681 Unsteadiness on feet: Secondary | ICD-10-CM | POA: Diagnosis not present

## 2022-08-06 DIAGNOSIS — Z9181 History of falling: Secondary | ICD-10-CM | POA: Diagnosis not present

## 2022-08-06 DIAGNOSIS — M6281 Muscle weakness (generalized): Secondary | ICD-10-CM | POA: Diagnosis not present

## 2022-08-06 DIAGNOSIS — R42 Dizziness and giddiness: Secondary | ICD-10-CM | POA: Diagnosis not present

## 2022-08-08 DIAGNOSIS — R42 Dizziness and giddiness: Secondary | ICD-10-CM | POA: Diagnosis not present

## 2022-08-08 DIAGNOSIS — Z9181 History of falling: Secondary | ICD-10-CM | POA: Diagnosis not present

## 2022-08-08 DIAGNOSIS — M6281 Muscle weakness (generalized): Secondary | ICD-10-CM | POA: Diagnosis not present

## 2022-08-08 DIAGNOSIS — R2681 Unsteadiness on feet: Secondary | ICD-10-CM | POA: Diagnosis not present

## 2022-08-13 ENCOUNTER — Other Ambulatory Visit: Payer: Self-pay | Admitting: Obstetrics and Gynecology

## 2022-08-13 DIAGNOSIS — R2681 Unsteadiness on feet: Secondary | ICD-10-CM | POA: Diagnosis not present

## 2022-08-13 DIAGNOSIS — Z1231 Encounter for screening mammogram for malignant neoplasm of breast: Secondary | ICD-10-CM

## 2022-08-13 DIAGNOSIS — M6281 Muscle weakness (generalized): Secondary | ICD-10-CM | POA: Diagnosis not present

## 2022-08-13 DIAGNOSIS — Z9181 History of falling: Secondary | ICD-10-CM | POA: Diagnosis not present

## 2022-08-13 DIAGNOSIS — R42 Dizziness and giddiness: Secondary | ICD-10-CM | POA: Diagnosis not present

## 2022-08-14 DIAGNOSIS — R0981 Nasal congestion: Secondary | ICD-10-CM | POA: Diagnosis not present

## 2022-08-14 DIAGNOSIS — J069 Acute upper respiratory infection, unspecified: Secondary | ICD-10-CM | POA: Diagnosis not present

## 2022-08-15 DIAGNOSIS — M6281 Muscle weakness (generalized): Secondary | ICD-10-CM | POA: Diagnosis not present

## 2022-08-15 DIAGNOSIS — R2681 Unsteadiness on feet: Secondary | ICD-10-CM | POA: Diagnosis not present

## 2022-08-15 DIAGNOSIS — R42 Dizziness and giddiness: Secondary | ICD-10-CM | POA: Diagnosis not present

## 2022-08-15 DIAGNOSIS — Z9181 History of falling: Secondary | ICD-10-CM | POA: Diagnosis not present

## 2022-08-19 DIAGNOSIS — Z9181 History of falling: Secondary | ICD-10-CM | POA: Diagnosis not present

## 2022-08-19 DIAGNOSIS — R42 Dizziness and giddiness: Secondary | ICD-10-CM | POA: Diagnosis not present

## 2022-08-19 DIAGNOSIS — R2681 Unsteadiness on feet: Secondary | ICD-10-CM | POA: Diagnosis not present

## 2022-08-19 DIAGNOSIS — M6281 Muscle weakness (generalized): Secondary | ICD-10-CM | POA: Diagnosis not present

## 2022-08-21 DIAGNOSIS — M6281 Muscle weakness (generalized): Secondary | ICD-10-CM | POA: Diagnosis not present

## 2022-08-21 DIAGNOSIS — E119 Type 2 diabetes mellitus without complications: Secondary | ICD-10-CM | POA: Diagnosis not present

## 2022-08-21 DIAGNOSIS — R42 Dizziness and giddiness: Secondary | ICD-10-CM | POA: Diagnosis not present

## 2022-08-21 DIAGNOSIS — Z9181 History of falling: Secondary | ICD-10-CM | POA: Diagnosis not present

## 2022-08-21 DIAGNOSIS — R2681 Unsteadiness on feet: Secondary | ICD-10-CM | POA: Diagnosis not present

## 2022-08-26 DIAGNOSIS — R42 Dizziness and giddiness: Secondary | ICD-10-CM | POA: Diagnosis not present

## 2022-08-26 DIAGNOSIS — Z9181 History of falling: Secondary | ICD-10-CM | POA: Diagnosis not present

## 2022-08-26 DIAGNOSIS — M6281 Muscle weakness (generalized): Secondary | ICD-10-CM | POA: Diagnosis not present

## 2022-08-26 DIAGNOSIS — R2681 Unsteadiness on feet: Secondary | ICD-10-CM | POA: Diagnosis not present

## 2022-08-27 ENCOUNTER — Ambulatory Visit
Admission: RE | Admit: 2022-08-27 | Discharge: 2022-08-27 | Disposition: A | Discharge status: Home / Self Care | Payer: PPO | Source: Ambulatory Visit | Attending: Obstetrics and Gynecology | Admitting: Obstetrics and Gynecology

## 2022-08-27 DIAGNOSIS — Z1231 Encounter for screening mammogram for malignant neoplasm of breast: Secondary | ICD-10-CM

## 2022-08-28 DIAGNOSIS — M6281 Muscle weakness (generalized): Secondary | ICD-10-CM | POA: Diagnosis not present

## 2022-08-28 DIAGNOSIS — Z9181 History of falling: Secondary | ICD-10-CM | POA: Diagnosis not present

## 2022-08-28 DIAGNOSIS — R42 Dizziness and giddiness: Secondary | ICD-10-CM | POA: Diagnosis not present

## 2022-08-28 DIAGNOSIS — R2681 Unsteadiness on feet: Secondary | ICD-10-CM | POA: Diagnosis not present

## 2022-09-02 DIAGNOSIS — M6281 Muscle weakness (generalized): Secondary | ICD-10-CM | POA: Diagnosis not present

## 2022-09-02 DIAGNOSIS — R42 Dizziness and giddiness: Secondary | ICD-10-CM | POA: Diagnosis not present

## 2022-09-02 DIAGNOSIS — R2681 Unsteadiness on feet: Secondary | ICD-10-CM | POA: Diagnosis not present

## 2022-09-02 DIAGNOSIS — Z9181 History of falling: Secondary | ICD-10-CM | POA: Diagnosis not present

## 2022-09-05 DIAGNOSIS — M6281 Muscle weakness (generalized): Secondary | ICD-10-CM | POA: Diagnosis not present

## 2022-09-05 DIAGNOSIS — Z9181 History of falling: Secondary | ICD-10-CM | POA: Diagnosis not present

## 2022-09-05 DIAGNOSIS — R42 Dizziness and giddiness: Secondary | ICD-10-CM | POA: Diagnosis not present

## 2022-09-05 DIAGNOSIS — R2681 Unsteadiness on feet: Secondary | ICD-10-CM | POA: Diagnosis not present

## 2022-09-09 DIAGNOSIS — M6281 Muscle weakness (generalized): Secondary | ICD-10-CM | POA: Diagnosis not present

## 2022-09-09 DIAGNOSIS — R2681 Unsteadiness on feet: Secondary | ICD-10-CM | POA: Diagnosis not present

## 2022-09-09 DIAGNOSIS — R42 Dizziness and giddiness: Secondary | ICD-10-CM | POA: Diagnosis not present

## 2022-09-09 DIAGNOSIS — Z9181 History of falling: Secondary | ICD-10-CM | POA: Diagnosis not present

## 2022-09-11 DIAGNOSIS — R2681 Unsteadiness on feet: Secondary | ICD-10-CM | POA: Diagnosis not present

## 2022-09-11 DIAGNOSIS — R42 Dizziness and giddiness: Secondary | ICD-10-CM | POA: Diagnosis not present

## 2022-09-11 DIAGNOSIS — Z9181 History of falling: Secondary | ICD-10-CM | POA: Diagnosis not present

## 2022-09-11 DIAGNOSIS — M6281 Muscle weakness (generalized): Secondary | ICD-10-CM | POA: Diagnosis not present

## 2022-09-16 DIAGNOSIS — Z9181 History of falling: Secondary | ICD-10-CM | POA: Diagnosis not present

## 2022-09-16 DIAGNOSIS — M6281 Muscle weakness (generalized): Secondary | ICD-10-CM | POA: Diagnosis not present

## 2022-09-16 DIAGNOSIS — R2681 Unsteadiness on feet: Secondary | ICD-10-CM | POA: Diagnosis not present

## 2022-09-16 DIAGNOSIS — R42 Dizziness and giddiness: Secondary | ICD-10-CM | POA: Diagnosis not present

## 2022-09-17 DIAGNOSIS — I10 Essential (primary) hypertension: Secondary | ICD-10-CM | POA: Diagnosis not present

## 2022-09-17 DIAGNOSIS — Z794 Long term (current) use of insulin: Secondary | ICD-10-CM | POA: Diagnosis not present

## 2022-09-17 DIAGNOSIS — E7800 Pure hypercholesterolemia, unspecified: Secondary | ICD-10-CM | POA: Diagnosis not present

## 2022-09-17 DIAGNOSIS — E119 Type 2 diabetes mellitus without complications: Secondary | ICD-10-CM | POA: Diagnosis not present

## 2022-09-18 DIAGNOSIS — Z9181 History of falling: Secondary | ICD-10-CM | POA: Diagnosis not present

## 2022-09-18 DIAGNOSIS — R42 Dizziness and giddiness: Secondary | ICD-10-CM | POA: Diagnosis not present

## 2022-09-18 DIAGNOSIS — R2681 Unsteadiness on feet: Secondary | ICD-10-CM | POA: Diagnosis not present

## 2022-09-18 DIAGNOSIS — M6281 Muscle weakness (generalized): Secondary | ICD-10-CM | POA: Diagnosis not present

## 2022-09-20 DIAGNOSIS — E119 Type 2 diabetes mellitus without complications: Secondary | ICD-10-CM | POA: Diagnosis not present

## 2022-09-24 DIAGNOSIS — Z9181 History of falling: Secondary | ICD-10-CM | POA: Diagnosis not present

## 2022-09-24 DIAGNOSIS — M6281 Muscle weakness (generalized): Secondary | ICD-10-CM | POA: Diagnosis not present

## 2022-09-24 DIAGNOSIS — R2681 Unsteadiness on feet: Secondary | ICD-10-CM | POA: Diagnosis not present

## 2022-09-24 DIAGNOSIS — R42 Dizziness and giddiness: Secondary | ICD-10-CM | POA: Diagnosis not present

## 2022-09-26 DIAGNOSIS — Z9181 History of falling: Secondary | ICD-10-CM | POA: Diagnosis not present

## 2022-09-26 DIAGNOSIS — R2681 Unsteadiness on feet: Secondary | ICD-10-CM | POA: Diagnosis not present

## 2022-09-26 DIAGNOSIS — M6281 Muscle weakness (generalized): Secondary | ICD-10-CM | POA: Diagnosis not present

## 2022-09-26 DIAGNOSIS — R42 Dizziness and giddiness: Secondary | ICD-10-CM | POA: Diagnosis not present

## 2022-10-01 DIAGNOSIS — M6281 Muscle weakness (generalized): Secondary | ICD-10-CM | POA: Diagnosis not present

## 2022-10-01 DIAGNOSIS — R42 Dizziness and giddiness: Secondary | ICD-10-CM | POA: Diagnosis not present

## 2022-10-01 DIAGNOSIS — R2681 Unsteadiness on feet: Secondary | ICD-10-CM | POA: Diagnosis not present

## 2022-10-01 DIAGNOSIS — Z9181 History of falling: Secondary | ICD-10-CM | POA: Diagnosis not present

## 2022-10-03 DIAGNOSIS — R2681 Unsteadiness on feet: Secondary | ICD-10-CM | POA: Diagnosis not present

## 2022-10-03 DIAGNOSIS — M6281 Muscle weakness (generalized): Secondary | ICD-10-CM | POA: Diagnosis not present

## 2022-10-03 DIAGNOSIS — R42 Dizziness and giddiness: Secondary | ICD-10-CM | POA: Diagnosis not present

## 2022-10-03 DIAGNOSIS — Z9181 History of falling: Secondary | ICD-10-CM | POA: Diagnosis not present

## 2022-10-07 DIAGNOSIS — R42 Dizziness and giddiness: Secondary | ICD-10-CM | POA: Diagnosis not present

## 2022-10-07 DIAGNOSIS — M6281 Muscle weakness (generalized): Secondary | ICD-10-CM | POA: Diagnosis not present

## 2022-10-07 DIAGNOSIS — Z9181 History of falling: Secondary | ICD-10-CM | POA: Diagnosis not present

## 2022-10-07 DIAGNOSIS — R2681 Unsteadiness on feet: Secondary | ICD-10-CM | POA: Diagnosis not present

## 2022-10-10 DIAGNOSIS — R2681 Unsteadiness on feet: Secondary | ICD-10-CM | POA: Diagnosis not present

## 2022-10-10 DIAGNOSIS — M6281 Muscle weakness (generalized): Secondary | ICD-10-CM | POA: Diagnosis not present

## 2022-10-10 DIAGNOSIS — Z9181 History of falling: Secondary | ICD-10-CM | POA: Diagnosis not present

## 2022-10-10 DIAGNOSIS — R42 Dizziness and giddiness: Secondary | ICD-10-CM | POA: Diagnosis not present

## 2022-10-14 DIAGNOSIS — M6281 Muscle weakness (generalized): Secondary | ICD-10-CM | POA: Diagnosis not present

## 2022-10-14 DIAGNOSIS — R2681 Unsteadiness on feet: Secondary | ICD-10-CM | POA: Diagnosis not present

## 2022-10-14 DIAGNOSIS — R42 Dizziness and giddiness: Secondary | ICD-10-CM | POA: Diagnosis not present

## 2022-10-14 DIAGNOSIS — Z9181 History of falling: Secondary | ICD-10-CM | POA: Diagnosis not present

## 2022-10-16 DIAGNOSIS — R2681 Unsteadiness on feet: Secondary | ICD-10-CM | POA: Diagnosis not present

## 2022-10-16 DIAGNOSIS — R42 Dizziness and giddiness: Secondary | ICD-10-CM | POA: Diagnosis not present

## 2022-10-16 DIAGNOSIS — M6281 Muscle weakness (generalized): Secondary | ICD-10-CM | POA: Diagnosis not present

## 2022-10-16 DIAGNOSIS — Z9181 History of falling: Secondary | ICD-10-CM | POA: Diagnosis not present

## 2022-10-21 DIAGNOSIS — E119 Type 2 diabetes mellitus without complications: Secondary | ICD-10-CM | POA: Diagnosis not present

## 2022-10-22 DIAGNOSIS — R2681 Unsteadiness on feet: Secondary | ICD-10-CM | POA: Diagnosis not present

## 2022-10-22 DIAGNOSIS — R42 Dizziness and giddiness: Secondary | ICD-10-CM | POA: Diagnosis not present

## 2022-10-22 DIAGNOSIS — M6281 Muscle weakness (generalized): Secondary | ICD-10-CM | POA: Diagnosis not present

## 2022-10-22 DIAGNOSIS — Z9181 History of falling: Secondary | ICD-10-CM | POA: Diagnosis not present

## 2022-10-24 DIAGNOSIS — R2681 Unsteadiness on feet: Secondary | ICD-10-CM | POA: Diagnosis not present

## 2022-10-24 DIAGNOSIS — M6281 Muscle weakness (generalized): Secondary | ICD-10-CM | POA: Diagnosis not present

## 2022-10-24 DIAGNOSIS — Z9181 History of falling: Secondary | ICD-10-CM | POA: Diagnosis not present

## 2022-10-24 DIAGNOSIS — R42 Dizziness and giddiness: Secondary | ICD-10-CM | POA: Diagnosis not present

## 2022-10-29 DIAGNOSIS — R2681 Unsteadiness on feet: Secondary | ICD-10-CM | POA: Diagnosis not present

## 2022-10-29 DIAGNOSIS — R42 Dizziness and giddiness: Secondary | ICD-10-CM | POA: Diagnosis not present

## 2022-10-29 DIAGNOSIS — Z9181 History of falling: Secondary | ICD-10-CM | POA: Diagnosis not present

## 2022-10-29 DIAGNOSIS — M6281 Muscle weakness (generalized): Secondary | ICD-10-CM | POA: Diagnosis not present

## 2022-10-30 DIAGNOSIS — Z6826 Body mass index (BMI) 26.0-26.9, adult: Secondary | ICD-10-CM | POA: Diagnosis not present

## 2022-10-30 DIAGNOSIS — M5136 Other intervertebral disc degeneration, lumbar region: Secondary | ICD-10-CM | POA: Diagnosis not present

## 2022-11-01 DIAGNOSIS — M6281 Muscle weakness (generalized): Secondary | ICD-10-CM | POA: Diagnosis not present

## 2022-11-01 DIAGNOSIS — R2681 Unsteadiness on feet: Secondary | ICD-10-CM | POA: Diagnosis not present

## 2022-11-01 DIAGNOSIS — R42 Dizziness and giddiness: Secondary | ICD-10-CM | POA: Diagnosis not present

## 2022-11-01 DIAGNOSIS — Z9181 History of falling: Secondary | ICD-10-CM | POA: Diagnosis not present

## 2022-11-05 DIAGNOSIS — M5136 Other intervertebral disc degeneration, lumbar region: Secondary | ICD-10-CM | POA: Diagnosis not present

## 2022-11-05 DIAGNOSIS — M4316 Spondylolisthesis, lumbar region: Secondary | ICD-10-CM | POA: Diagnosis not present

## 2022-11-05 DIAGNOSIS — M48061 Spinal stenosis, lumbar region without neurogenic claudication: Secondary | ICD-10-CM | POA: Diagnosis not present

## 2022-11-06 DIAGNOSIS — R42 Dizziness and giddiness: Secondary | ICD-10-CM | POA: Diagnosis not present

## 2022-11-06 DIAGNOSIS — M6281 Muscle weakness (generalized): Secondary | ICD-10-CM | POA: Diagnosis not present

## 2022-11-06 DIAGNOSIS — R2681 Unsteadiness on feet: Secondary | ICD-10-CM | POA: Diagnosis not present

## 2022-11-06 DIAGNOSIS — Z9181 History of falling: Secondary | ICD-10-CM | POA: Diagnosis not present

## 2022-11-07 DIAGNOSIS — R42 Dizziness and giddiness: Secondary | ICD-10-CM | POA: Diagnosis not present

## 2022-11-07 DIAGNOSIS — R2681 Unsteadiness on feet: Secondary | ICD-10-CM | POA: Diagnosis not present

## 2022-11-07 DIAGNOSIS — M6281 Muscle weakness (generalized): Secondary | ICD-10-CM | POA: Diagnosis not present

## 2022-11-07 DIAGNOSIS — M5136 Other intervertebral disc degeneration, lumbar region: Secondary | ICD-10-CM | POA: Diagnosis not present

## 2022-11-07 DIAGNOSIS — Z9181 History of falling: Secondary | ICD-10-CM | POA: Diagnosis not present

## 2022-11-12 DIAGNOSIS — Z9181 History of falling: Secondary | ICD-10-CM | POA: Diagnosis not present

## 2022-11-12 DIAGNOSIS — R42 Dizziness and giddiness: Secondary | ICD-10-CM | POA: Diagnosis not present

## 2022-11-12 DIAGNOSIS — R2681 Unsteadiness on feet: Secondary | ICD-10-CM | POA: Diagnosis not present

## 2022-11-12 DIAGNOSIS — M6281 Muscle weakness (generalized): Secondary | ICD-10-CM | POA: Diagnosis not present

## 2022-11-13 ENCOUNTER — Encounter (HOSPITAL_COMMUNITY): Payer: Self-pay | Admitting: Internal Medicine

## 2022-11-13 ENCOUNTER — Encounter (HOSPITAL_COMMUNITY)
Admission: RE | Disposition: A | Discharge status: Home / Self Care | Payer: Self-pay | Source: Ambulatory Visit | Attending: Internal Medicine

## 2022-11-13 ENCOUNTER — Ambulatory Visit (HOSPITAL_COMMUNITY)
Admission: RE | Admit: 2022-11-13 | Discharge: 2022-11-13 | Disposition: A | Discharge status: Home / Self Care | Payer: PPO | Source: Ambulatory Visit | Attending: Internal Medicine | Admitting: Internal Medicine

## 2022-11-13 DIAGNOSIS — R1319 Other dysphagia: Secondary | ICD-10-CM | POA: Diagnosis not present

## 2022-11-13 DIAGNOSIS — R1310 Dysphagia, unspecified: Secondary | ICD-10-CM | POA: Diagnosis not present

## 2022-11-13 DIAGNOSIS — K224 Dyskinesia of esophagus: Secondary | ICD-10-CM | POA: Diagnosis not present

## 2022-11-13 HISTORY — PX: ESOPHAGEAL MANOMETRY: SHX5429

## 2022-11-13 SURGERY — MANOMETRY, ESOPHAGUS

## 2022-11-13 MED ORDER — LIDOCAINE VISCOUS HCL 2 % MT SOLN
OROMUCOSAL | Status: AC
  Filled 2022-11-13: qty 15

## 2022-11-13 SURGICAL SUPPLY — 2 items
FACESHIELD LNG OPTICON STERILE (SAFETY) IMPLANT
GLOVE BIO SURGEON STRL SZ8 (GLOVE) ×2 IMPLANT

## 2022-11-13 NOTE — Progress Notes (Signed)
Esophageal Manometry done per protocol. Patient tolerated well without distress or complication.

## 2022-11-15 DIAGNOSIS — M6281 Muscle weakness (generalized): Secondary | ICD-10-CM | POA: Diagnosis not present

## 2022-11-15 DIAGNOSIS — R2681 Unsteadiness on feet: Secondary | ICD-10-CM | POA: Diagnosis not present

## 2022-11-15 DIAGNOSIS — R42 Dizziness and giddiness: Secondary | ICD-10-CM | POA: Diagnosis not present

## 2022-11-15 DIAGNOSIS — Z9181 History of falling: Secondary | ICD-10-CM | POA: Diagnosis not present

## 2022-11-16 ENCOUNTER — Encounter (HOSPITAL_COMMUNITY): Payer: Self-pay | Admitting: Internal Medicine

## 2022-11-21 DIAGNOSIS — E119 Type 2 diabetes mellitus without complications: Secondary | ICD-10-CM | POA: Diagnosis not present

## 2022-11-26 ENCOUNTER — Telehealth: Payer: Self-pay | Admitting: Internal Medicine

## 2022-11-26 ENCOUNTER — Other Ambulatory Visit: Payer: Self-pay

## 2022-11-26 DIAGNOSIS — Z9181 History of falling: Secondary | ICD-10-CM | POA: Diagnosis not present

## 2022-11-26 DIAGNOSIS — R42 Dizziness and giddiness: Secondary | ICD-10-CM | POA: Diagnosis not present

## 2022-11-26 DIAGNOSIS — R1310 Dysphagia, unspecified: Secondary | ICD-10-CM

## 2022-11-26 DIAGNOSIS — R2681 Unsteadiness on feet: Secondary | ICD-10-CM | POA: Diagnosis not present

## 2022-11-26 DIAGNOSIS — M6281 Muscle weakness (generalized): Secondary | ICD-10-CM | POA: Diagnosis not present

## 2022-11-26 NOTE — Telephone Encounter (Signed)
See result note.  

## 2022-11-26 NOTE — Telephone Encounter (Signed)
Patient returned your call, please advise. 

## 2022-11-27 DIAGNOSIS — R1319 Other dysphagia: Secondary | ICD-10-CM

## 2022-11-27 DIAGNOSIS — K224 Dyskinesia of esophagus: Secondary | ICD-10-CM

## 2022-11-28 DIAGNOSIS — M6281 Muscle weakness (generalized): Secondary | ICD-10-CM | POA: Diagnosis not present

## 2022-11-28 DIAGNOSIS — R42 Dizziness and giddiness: Secondary | ICD-10-CM | POA: Diagnosis not present

## 2022-11-28 DIAGNOSIS — R2681 Unsteadiness on feet: Secondary | ICD-10-CM | POA: Diagnosis not present

## 2022-11-28 DIAGNOSIS — Z9181 History of falling: Secondary | ICD-10-CM | POA: Diagnosis not present

## 2022-12-03 DIAGNOSIS — Z9181 History of falling: Secondary | ICD-10-CM | POA: Diagnosis not present

## 2022-12-03 DIAGNOSIS — M6281 Muscle weakness (generalized): Secondary | ICD-10-CM | POA: Diagnosis not present

## 2022-12-03 DIAGNOSIS — Z01411 Encounter for gynecological examination (general) (routine) with abnormal findings: Secondary | ICD-10-CM | POA: Diagnosis not present

## 2022-12-03 DIAGNOSIS — R2681 Unsteadiness on feet: Secondary | ICD-10-CM | POA: Diagnosis not present

## 2022-12-03 DIAGNOSIS — R42 Dizziness and giddiness: Secondary | ICD-10-CM | POA: Diagnosis not present

## 2022-12-05 ENCOUNTER — Encounter: Payer: Self-pay | Admitting: *Deleted

## 2022-12-05 DIAGNOSIS — R2681 Unsteadiness on feet: Secondary | ICD-10-CM | POA: Diagnosis not present

## 2022-12-05 DIAGNOSIS — Z9181 History of falling: Secondary | ICD-10-CM | POA: Diagnosis not present

## 2022-12-05 DIAGNOSIS — M6281 Muscle weakness (generalized): Secondary | ICD-10-CM | POA: Diagnosis not present

## 2022-12-05 DIAGNOSIS — R42 Dizziness and giddiness: Secondary | ICD-10-CM | POA: Diagnosis not present

## 2022-12-11 DIAGNOSIS — Z9181 History of falling: Secondary | ICD-10-CM | POA: Diagnosis not present

## 2022-12-11 DIAGNOSIS — R2681 Unsteadiness on feet: Secondary | ICD-10-CM | POA: Diagnosis not present

## 2022-12-11 DIAGNOSIS — M6281 Muscle weakness (generalized): Secondary | ICD-10-CM | POA: Diagnosis not present

## 2022-12-11 DIAGNOSIS — R42 Dizziness and giddiness: Secondary | ICD-10-CM | POA: Diagnosis not present

## 2022-12-12 DIAGNOSIS — M6281 Muscle weakness (generalized): Secondary | ICD-10-CM | POA: Diagnosis not present

## 2022-12-12 DIAGNOSIS — R2681 Unsteadiness on feet: Secondary | ICD-10-CM | POA: Diagnosis not present

## 2022-12-12 DIAGNOSIS — R42 Dizziness and giddiness: Secondary | ICD-10-CM | POA: Diagnosis not present

## 2022-12-12 DIAGNOSIS — Z9181 History of falling: Secondary | ICD-10-CM | POA: Diagnosis not present

## 2022-12-16 ENCOUNTER — Ambulatory Visit (HOSPITAL_COMMUNITY)
Admission: RE | Admit: 2022-12-16 | Discharge: 2022-12-16 | Disposition: A | Discharge status: Home / Self Care | Payer: PPO | Source: Ambulatory Visit | Attending: Internal Medicine | Admitting: Internal Medicine

## 2022-12-16 DIAGNOSIS — R2681 Unsteadiness on feet: Secondary | ICD-10-CM | POA: Diagnosis not present

## 2022-12-16 DIAGNOSIS — M6281 Muscle weakness (generalized): Secondary | ICD-10-CM | POA: Diagnosis not present

## 2022-12-16 DIAGNOSIS — Z9181 History of falling: Secondary | ICD-10-CM | POA: Diagnosis not present

## 2022-12-16 DIAGNOSIS — I700 Atherosclerosis of aorta: Secondary | ICD-10-CM | POA: Diagnosis not present

## 2022-12-16 DIAGNOSIS — R1310 Dysphagia, unspecified: Secondary | ICD-10-CM

## 2022-12-16 DIAGNOSIS — I2510 Atherosclerotic heart disease of native coronary artery without angina pectoris: Secondary | ICD-10-CM | POA: Diagnosis not present

## 2022-12-16 DIAGNOSIS — R42 Dizziness and giddiness: Secondary | ICD-10-CM | POA: Diagnosis not present

## 2022-12-16 MED ORDER — IOHEXOL 300 MG/ML  SOLN
75.0000 mL | Freq: Once | INTRAMUSCULAR | Status: AC | PRN
  Administered 2022-12-16: 75 mL via INTRAVENOUS

## 2022-12-18 DIAGNOSIS — E119 Type 2 diabetes mellitus without complications: Secondary | ICD-10-CM | POA: Diagnosis not present

## 2022-12-18 DIAGNOSIS — R42 Dizziness and giddiness: Secondary | ICD-10-CM | POA: Diagnosis not present

## 2022-12-18 DIAGNOSIS — Z9181 History of falling: Secondary | ICD-10-CM | POA: Diagnosis not present

## 2022-12-18 DIAGNOSIS — R2681 Unsteadiness on feet: Secondary | ICD-10-CM | POA: Diagnosis not present

## 2022-12-18 DIAGNOSIS — E669 Obesity, unspecified: Secondary | ICD-10-CM | POA: Diagnosis not present

## 2022-12-18 DIAGNOSIS — I10 Essential (primary) hypertension: Secondary | ICD-10-CM | POA: Diagnosis not present

## 2022-12-18 DIAGNOSIS — M6281 Muscle weakness (generalized): Secondary | ICD-10-CM | POA: Diagnosis not present

## 2022-12-18 DIAGNOSIS — R7989 Other specified abnormal findings of blood chemistry: Secondary | ICD-10-CM | POA: Diagnosis not present

## 2022-12-18 DIAGNOSIS — E7800 Pure hypercholesterolemia, unspecified: Secondary | ICD-10-CM | POA: Diagnosis not present

## 2022-12-20 DIAGNOSIS — E119 Type 2 diabetes mellitus without complications: Secondary | ICD-10-CM | POA: Diagnosis not present

## 2022-12-23 DIAGNOSIS — Z9181 History of falling: Secondary | ICD-10-CM | POA: Diagnosis not present

## 2022-12-23 DIAGNOSIS — R42 Dizziness and giddiness: Secondary | ICD-10-CM | POA: Diagnosis not present

## 2022-12-23 DIAGNOSIS — M6281 Muscle weakness (generalized): Secondary | ICD-10-CM | POA: Diagnosis not present

## 2022-12-23 DIAGNOSIS — R2681 Unsteadiness on feet: Secondary | ICD-10-CM | POA: Diagnosis not present

## 2022-12-25 DIAGNOSIS — F3130 Bipolar disorder, current episode depressed, mild or moderate severity, unspecified: Secondary | ICD-10-CM | POA: Diagnosis not present

## 2022-12-25 DIAGNOSIS — Z1339 Encounter for screening examination for other mental health and behavioral disorders: Secondary | ICD-10-CM | POA: Diagnosis not present

## 2022-12-25 DIAGNOSIS — D692 Other nonthrombocytopenic purpura: Secondary | ICD-10-CM | POA: Diagnosis not present

## 2022-12-25 DIAGNOSIS — Z794 Long term (current) use of insulin: Secondary | ICD-10-CM | POA: Diagnosis not present

## 2022-12-25 DIAGNOSIS — F418 Other specified anxiety disorders: Secondary | ICD-10-CM | POA: Diagnosis not present

## 2022-12-25 DIAGNOSIS — E119 Type 2 diabetes mellitus without complications: Secondary | ICD-10-CM | POA: Diagnosis not present

## 2022-12-25 DIAGNOSIS — I209 Angina pectoris, unspecified: Secondary | ICD-10-CM | POA: Diagnosis not present

## 2022-12-25 DIAGNOSIS — I700 Atherosclerosis of aorta: Secondary | ICD-10-CM | POA: Diagnosis not present

## 2022-12-25 DIAGNOSIS — R82998 Other abnormal findings in urine: Secondary | ICD-10-CM | POA: Diagnosis not present

## 2022-12-25 DIAGNOSIS — Z0000 Encounter for general adult medical examination without abnormal findings: Secondary | ICD-10-CM | POA: Diagnosis not present

## 2022-12-25 DIAGNOSIS — I119 Hypertensive heart disease without heart failure: Secondary | ICD-10-CM | POA: Diagnosis not present

## 2022-12-25 DIAGNOSIS — E1159 Type 2 diabetes mellitus with other circulatory complications: Secondary | ICD-10-CM | POA: Diagnosis not present

## 2022-12-25 DIAGNOSIS — Z1331 Encounter for screening for depression: Secondary | ICD-10-CM | POA: Diagnosis not present

## 2022-12-25 DIAGNOSIS — I10 Essential (primary) hypertension: Secondary | ICD-10-CM | POA: Diagnosis not present

## 2022-12-25 DIAGNOSIS — E7800 Pure hypercholesterolemia, unspecified: Secondary | ICD-10-CM | POA: Diagnosis not present

## 2022-12-25 DIAGNOSIS — I2584 Coronary atherosclerosis due to calcified coronary lesion: Secondary | ICD-10-CM | POA: Diagnosis not present

## 2022-12-25 DIAGNOSIS — E663 Overweight: Secondary | ICD-10-CM | POA: Diagnosis not present

## 2022-12-26 DIAGNOSIS — M6281 Muscle weakness (generalized): Secondary | ICD-10-CM | POA: Diagnosis not present

## 2022-12-26 DIAGNOSIS — R2681 Unsteadiness on feet: Secondary | ICD-10-CM | POA: Diagnosis not present

## 2022-12-26 DIAGNOSIS — Z9181 History of falling: Secondary | ICD-10-CM | POA: Diagnosis not present

## 2022-12-26 DIAGNOSIS — R42 Dizziness and giddiness: Secondary | ICD-10-CM | POA: Diagnosis not present

## 2022-12-30 DIAGNOSIS — R2681 Unsteadiness on feet: Secondary | ICD-10-CM | POA: Diagnosis not present

## 2022-12-30 DIAGNOSIS — R42 Dizziness and giddiness: Secondary | ICD-10-CM | POA: Diagnosis not present

## 2022-12-30 DIAGNOSIS — Z9181 History of falling: Secondary | ICD-10-CM | POA: Diagnosis not present

## 2022-12-30 DIAGNOSIS — M6281 Muscle weakness (generalized): Secondary | ICD-10-CM | POA: Diagnosis not present

## 2023-01-02 DIAGNOSIS — R42 Dizziness and giddiness: Secondary | ICD-10-CM | POA: Diagnosis not present

## 2023-01-02 DIAGNOSIS — Z9181 History of falling: Secondary | ICD-10-CM | POA: Diagnosis not present

## 2023-01-02 DIAGNOSIS — R2681 Unsteadiness on feet: Secondary | ICD-10-CM | POA: Diagnosis not present

## 2023-01-02 DIAGNOSIS — M6281 Muscle weakness (generalized): Secondary | ICD-10-CM | POA: Diagnosis not present

## 2023-01-07 ENCOUNTER — Ambulatory Visit: Payer: PPO | Admitting: Internal Medicine

## 2023-01-07 ENCOUNTER — Encounter: Payer: Self-pay | Admitting: Internal Medicine

## 2023-01-07 VITALS — BP 104/52 | HR 97 | Ht 61.0 in | Wt 140.0 lb

## 2023-01-07 DIAGNOSIS — K219 Gastro-esophageal reflux disease without esophagitis: Secondary | ICD-10-CM | POA: Diagnosis not present

## 2023-01-07 DIAGNOSIS — K580 Irritable bowel syndrome with diarrhea: Secondary | ICD-10-CM

## 2023-01-07 DIAGNOSIS — K224 Dyskinesia of esophagus: Secondary | ICD-10-CM | POA: Diagnosis not present

## 2023-01-07 MED ORDER — PANTOPRAZOLE SODIUM 40 MG PO TBEC: 40.0000 mg | DELAYED_RELEASE_TABLET | Freq: Every day | ORAL | 3 refills | Status: DC

## 2023-01-07 MED ORDER — DILTIAZEM HCL 30 MG PO TABS: 30.0000 mg | ORAL_TABLET | Freq: Four times a day (QID) | ORAL | 0 refills | Status: DC

## 2023-01-07 NOTE — Patient Instructions (Addendum)
Discontinue omeprazole  We have sent the following medications to your pharmacy for you to pick up at your convenience: Pantoprazole Diltiazem   Take half of the simvastatin pill daily while taking diltiazem as discussed with Dr Rhea Belton. We have scheduled you for a 3 month follow up on 04/30/2023 at 10:10am _______________________________________________________  If your blood pressure at your visit was 140/90 or greater, please contact your primary care physician to follow up on this.  _______________________________________________________  If you are age 34 or older, your body mass index should be between 23-30. Your Body mass index is 26.45 kg/m. If this is out of the aforementioned range listed, please consider follow up with your Primary Care Provider.  If you are age 33 or younger, your body mass index should be between 19-25. Your Body mass index is 26.45 kg/m. If this is out of the aformentioned range listed, please consider follow up with your Primary Care Provider.   ________________________________________________________  The [place] [facility] providers would like to encourage you to use [facility] to communicate with providers for non-urgent requests or questions.  Due to long hold times on the telephone, sending your provider a message by [facility] may be a faster and more efficient way to get a response.  Please allow 48 business hours for a response.  Please remember that this is for non-urgent requests.  _______________________________________________________ It was a pleasure to see you today!  Thank you for trusting me with your gastrointestinal care!

## 2023-01-08 ENCOUNTER — Encounter: Payer: Self-pay | Admitting: Internal Medicine

## 2023-01-08 DIAGNOSIS — R42 Dizziness and giddiness: Secondary | ICD-10-CM | POA: Diagnosis not present

## 2023-01-08 DIAGNOSIS — M6281 Muscle weakness (generalized): Secondary | ICD-10-CM | POA: Diagnosis not present

## 2023-01-08 DIAGNOSIS — R2681 Unsteadiness on feet: Secondary | ICD-10-CM | POA: Diagnosis not present

## 2023-01-08 DIAGNOSIS — Z9181 History of falling: Secondary | ICD-10-CM | POA: Diagnosis not present

## 2023-01-08 NOTE — Progress Notes (Signed)
Subjective:    Patient ID: Adrienne [name], female    DOB: [birth year], 71 y.o.   MRN: [id number]  HPI Adrienne [name] is a 72 year old female with a history of GERD, esophageal dysmotility, IBS-D, anxiety, diabetes, hyperlipidemia who is seen for follow-up after esophageal manometry.  She is here alone today.  The patient had barium esophagram which favor dysmotility.  She had esophageal dilation in 2022 which only provided minimal and very short acting relief. Repeat esophageal manometry was done recently and interpreted by Dr. Lavon Paganini.  Manometry revealed normal relaxation at the EG junction.  There was a hiatal hernia.  The esophagus was hypercontractile. This was followed up with CT scan to ensure no infiltrative process which was negative from an esophageal perspective.  She reports today that she continues to have issues with swallowing.  She has difficulty with both liquid and solid food dysphagia but probably solid greater than liquid.  There is no pain or odynophagia with swallowing.  She does have to push fluid after swallowing to help clear her esophagus.  At times this can make her feel slightly panicked.  She is having some heartburn despite her omeprazole 20 mg daily.  No abdominal pain.  Bowel movements per her normal.  She does recall that Dr. Jarold Motto years ago started her on isosorbide mononitrate which she takes 15 mg.  She recalls this was also to try to help her esophagus.  Recently she has had some issues with balance and undergoing physical therapy.  She will be seeing Dr. Antoine Poche in mid May to follow-up CAD suggested by her chest CT.   Review of Systems As per HPI, otherwise negative  Current Medications, Allergies, Past Medical History, Past Surgical History, Family History and Social History were reviewed in [employer] record.    Objective:   Physical Exam BP (!) 104/52   Pulse 97   Ht 5\' 1"  (1.549 m)   Wt 140 lb (63.5 kg)   SpO2  95%   BMI 26.45 kg/m  Gen: awake, alert, NAD HEENT: anicteric  CV: RRR, no mrg Pulm: CTA b/l Abd: soft, NT/ND, +BS throughout Ext: no c/c/e Neuro: nonfocal  CT CHEST WITH CONTRAST   TECHNIQUE: Multidetector CT imaging of the chest was performed during intravenous contrast administration.   RADIATION DOSE REDUCTION: This exam was performed according to the departmental dose-optimization program which includes automated exposure control, adjustment of the mA and/or kV according to patient size and/or use of iterative reconstruction technique.   CONTRAST:  29mL OMNIPAQUE IOHEXOL 300 MG/ML  SOLN   COMPARISON:  None available.   FINDINGS: Cardiovascular: Calcified aortic atherosclerotic changes. No aneurysmal dilation. Three-vessel branch pattern in the chest. Calcified coronary artery disease. Normal heart size without pericardial effusion or sign of pericardial nodularity.   Mediastinum/Nodes: No thoracic inlet, axillary, mediastinal or hilar adenopathy. Esophagus grossly normal.   Lungs/Pleura: No signs of consolidation or evidence of pleural effusion. Airways are patent.   Upper Abdomen: Incidental imaging of upper abdominal contents shows no acute process.   Musculoskeletal: No chest wall mass.   On limited assessment no acute skeletal process.   IMPRESSION: 1. No acute findings in the chest. 2. Calcified coronary artery disease. 3. Aortic atherosclerosis.   Aortic Atherosclerosis (ICD10-I70.0).     Electronically Signed   By: Donzetta Kohut M.D.   On: 12/18/2022 10:59    Assessment & Plan:  72 year old female with a history of GERD, esophageal dysmotility, IBS-D, anxiety, diabetes, hyperlipidemia who is  seen for follow-up after esophageal manometry.   GERD with hypercontractile esophagus and dysphagia --we discussed her symptoms today and I would like to try her on a calcium channel blocker.  We also need to maximize GERD control.  We discussed diltiazem  and the possible side effects.  We also need to lower her simvastatin to 20 mg daily to start diltiazem.  I would like Dr. Jenene Slicker opinion particularly as it relates to her esophageal need for calcium channel blocker but it also seems that she is on a more old/remote mononitrate therapy for the same. -- Begin diltiazem 30 mg short acting 4 times daily; titrate for effect and then can consolidate to the once daily long-acting formula if helpful to her -- For now continue isosorbide mononitrate 15 mg daily -- Reduce simvastatin from 40 to 20 mg daily in order to use diltiazem per pharmacy -- Cardiology input regarding her calcium channel blocker therapy, nitrate therapy as it relates to her cardiac needs and blood pressure -- Discontinue omeprazole and begin pantoprazole 40 mg daily to try to get GERD under complete control  2.  IBS-D --Lomotil as needed.  Anxiety a factor.  Previously negative celiac testing.  3.  History of small adenoma --subcentimeter of the colon in July 2022, recall would be July 2029 based on overall health at that time  Follow-up with me in 8 to 12 weeks, sooner if needed

## 2023-01-15 DIAGNOSIS — M79641 Pain in right hand: Secondary | ICD-10-CM | POA: Diagnosis not present

## 2023-01-15 DIAGNOSIS — G5601 Carpal tunnel syndrome, right upper limb: Secondary | ICD-10-CM | POA: Diagnosis not present

## 2023-01-15 DIAGNOSIS — Z9181 History of falling: Secondary | ICD-10-CM | POA: Diagnosis not present

## 2023-01-15 DIAGNOSIS — R42 Dizziness and giddiness: Secondary | ICD-10-CM | POA: Diagnosis not present

## 2023-01-15 DIAGNOSIS — R2681 Unsteadiness on feet: Secondary | ICD-10-CM | POA: Diagnosis not present

## 2023-01-15 DIAGNOSIS — M6281 Muscle weakness (generalized): Secondary | ICD-10-CM | POA: Diagnosis not present

## 2023-01-15 DIAGNOSIS — M65342 Trigger finger, left ring finger: Secondary | ICD-10-CM | POA: Diagnosis not present

## 2023-01-20 DIAGNOSIS — E119 Type 2 diabetes mellitus without complications: Secondary | ICD-10-CM | POA: Diagnosis not present

## 2023-01-29 DIAGNOSIS — M6281 Muscle weakness (generalized): Secondary | ICD-10-CM | POA: Diagnosis not present

## 2023-01-29 DIAGNOSIS — R2681 Unsteadiness on feet: Secondary | ICD-10-CM | POA: Diagnosis not present

## 2023-01-29 DIAGNOSIS — R42 Dizziness and giddiness: Secondary | ICD-10-CM | POA: Diagnosis not present

## 2023-01-29 DIAGNOSIS — Z9181 History of falling: Secondary | ICD-10-CM | POA: Diagnosis not present

## 2023-02-09 ENCOUNTER — Encounter: Payer: Self-pay | Admitting: Cardiology

## 2023-02-09 DIAGNOSIS — R931 Abnormal findings on diagnostic imaging of heart and coronary circulation: Secondary | ICD-10-CM | POA: Insufficient documentation

## 2023-02-09 NOTE — Progress Notes (Unsigned)
Cardiology Office Note   Date:  02/12/2023   ID:  Adrienne [name], DOB 07/19/1951, MRN [id number]  PCP:  Gaspar Garbe, MD  Cardiologist:   None Referring:  Tisovec, Adelfa Koh, MD  Chief Complaint  Patient presents with   Coronary Calcium      History of Present Illness: Adrienne [name] is a 72 y.o. female who presents for evaluation of coronary artery calcium.  I saw her in 2017.  She had non specific changes on her EKG.  She had a negative POET (Plain Old Exercise Treadmill) and had an unremarkable echo.    She has been evaluated recently for swallowing issues had some esophageal problems.  CT demonstrated aortic atherosclerosis and coronary calcium.  She presents here.  She has some sporadic chest discomfort but she has been active this year.  She is able to do all of her household chores and take care of her husband who is disabled.  The patient denies any new symptoms such as chest discomfort, neck or arm discomfort. There has been no new shortness of breath, PND or orthopnea. There have been no reported palpitations, presyncope or syncope.    Past Medical History:  Diagnosis Date   Arthritis    hands/fingers   Depression    Diabetes (HCC)    Eczema    Esophageal reflux    Esophageal stricture    Generalized anxiety disorder    GERD (gastroesophageal reflux disease)    Hiatal hernia    Hyperlipidemia    Hypertension    states under control with med., has been on med. x 30 yr.   Insulin dependent diabetes mellitus    Irritable bowel syndrome    Nutcracker esophagus    Trigger finger of right hand 09/2017   index and middle fingers   Wears hearing aid in both ears     Past Surgical History:  Procedure Laterality Date   CARPAL TUNNEL RELEASE Left 01/07/2014   Procedure: LEFT CARPAL TUNNEL RELEASE;  Surgeon: Tami Ribas, MD;  Location: [facility];  Service: Orthopedics;  Laterality: Left;   COLONOSCOPY     ESOPHAGEAL MANOMETRY  05/28/2006    ESOPHAGEAL MANOMETRY N/A 11/13/2022   Procedure: ESOPHAGEAL MANOMETRY (EM);  Surgeon: Beverley Fiedler, MD;  Location: [facility];  Service: Gastroenterology;  Laterality: N/A;   EYE SURGERY Right    tear duct repair   NASAL SINUS SURGERY     TONSILLECTOMY     TRIGGER FINGER RELEASE Right 11/06/2017   Procedure: RELEASE TRIGGER FINGER/A-1 PULLEY RIGHT INDEX FINGER AND RIGHT MIDDLE FINGER;  Surgeon: Betha Loa, MD;  Location: [facility];  Service: Orthopedics;  Laterality: Right;   UPPER GASTROINTESTINAL ENDOSCOPY  10/18/2013   with dilation; with Propofol   UPPER GASTROINTESTINAL ENDOSCOPY  10/30/2016     Current Outpatient Medications  Medication Sig Dispense Refill   ALPRAZolam (XANAX) 0.25 MG tablet TAKE 1 TABLET BY MOUTH ONCE DAILY AS NEEDED FOR ANXIETY OR SLEEP  0   amLODipine (NORVASC) 2.5 MG tablet Take 2.5 mg by mouth daily.     aspirin 81 MG tablet Take 81 mg by mouth daily.     calcium carbonate (OS-CAL) 600 MG TABS tablet Take 500 mg by mouth 2 (two) times daily with a meal.     celecoxib (CELEBREX) 200 MG capsule Take 200 mg by mouth daily.     Cetirizine HCl 10 MG CAPS Take 10 mg by mouth as needed.  Continuous Glucose Sensor (FREESTYLE LIBRE 3 SENSOR) MISC Inject into the skin daily in the afternoon.     diltiazem (CARDIZEM) 30 MG tablet Take 1 tablet by mouth 4 times daily 120 tablet 0   empagliflozin (JARDIANCE) 25 MG TABS tablet Take 25 mg by mouth daily.     Ferrous Sulfate (IRON SLOW RELEASE) 140 (45 Fe) MG TBCR Take 1 tablet by mouth daily.     fluticasone (FLONASE) 50 MCG/ACT nasal spray Place into the nose.     furosemide (LASIX) 20 MG tablet furosemide 20 mg tablet     ibuprofen (ADVIL) 600 MG tablet Take 600 mg by mouth every 6 (six) hours as needed.     insulin glargine (LANTUS) 100 UNIT/ML injection Inject 26 Units into the skin daily.     isosorbide mononitrate (IMDUR) 30 MG 24 hr tablet Take 15 mg by mouth daily.     metFORMIN (GLUCOPHAGE)  1000 MG tablet Take 1,000 mg by mouth 2 (two) times daily with a meal.      Multiple Vitamin (MULTIVITAMIN) tablet Take 1 tablet by mouth daily.     pantoprazole (PROTONIX) 40 MG tablet Take 1 tablet (40 mg total) by mouth daily. 30 tablet 3   ramipril (ALTACE) 10 MG tablet Take 10 mg by mouth daily.     simvastatin (ZOCOR) 40 MG tablet Take 1 tablet by mouth every evening. Take half tablet daily.     traMADol (ULTRAM) 50 MG tablet Take 50 mg by mouth every 6 (six) hours as needed.     TRULICITY 4.5 MG/0.5ML SOPN Inject 4.5 mg into the skin once a week. Tuesday     venlafaxine XR (EFFEXOR-XR) 37.5 MG 24 hr capsule Take 1 capsule by mouth. DAILY     azelastine (ASTELIN) 0.1 % nasal spray azelastine 137 mcg (0.1 %) nasal spray aerosol (Patient not taking: Reported on 02/12/2023)     diphenoxylate-atropine (LOMOTIL) 2.5-0.025 MG tablet Take 2 tablets by mouth 4 (four) times daily as needed. (Patient not taking: Reported on 02/12/2023)     Dulaglutide 1.5 MG/0.5ML SOPN Inject 1.5 mg into the skin once a week. [facility] (Patient not taking: Reported on 02/12/2023)     Iron-Folic Acid-Vit B12 (IRON FORMULA PO) Take by mouth. (Patient not taking: Reported on 02/12/2023)     omeprazole (PRILOSEC) 20 MG capsule Take 20 mg by mouth daily. (Patient not taking: Reported on 02/12/2023)     ONETOUCH VERIO test strip daily. (Patient not taking: Reported on 02/12/2023)     Current Facility-Administered Medications  Medication Dose Route Frequency Provider Last Rate Last Admin   dexamethasone (DECADRON) injection 2 mg  2 mg Other Once Freddie Breech, DPM        Allergies:   Sulfa antibiotics    Social History:  The patient  reports that she has never smoked. She has never used smokeless tobacco. She reports current alcohol use. She reports that she does not use drugs.   Family History:  The patient's family history includes AAA (abdominal aortic aneurysm) in her brother and father; Anesthesia problems in her  mother; CAD (age of onset: 47) in her father; Colon cancer in her maternal grandmother; Diabetes in her father and sister; Inflammatory bowel disease in her grandchild; Peripheral vascular disease (age of onset: 36) in her mother; Prostate cancer in her paternal grandfather; Stomach cancer in her maternal grandfather; Stroke in her sister.    ROS:  Please see the history of present illness.   Otherwise, review  of systems are positive for none.   All other systems are reviewed and negative.    PHYSICAL EXAM: VS:  BP 112/62 (BP Location: Left Arm, Patient Position: Sitting, Cuff Size: Normal)   Pulse 78   Ht 5\' 1"  (1.549 m)   Wt 140 lb 12.8 oz (63.9 kg)   SpO2 96%   BMI 26.60 kg/m  , BMI Body mass index is 26.6 kg/m. GENERAL:  Well appearing HEENT:  Pupils equal round and reactive, fundi not visualized, oral mucosa unremarkable NECK:  No jugular venous distention, waveform within normal limits, carotid upstroke brisk and symmetric, possible left carotid bruits, no thyromegaly LYMPHATICS:  No cervical, inguinal adenopathy LUNGS:  Clear to auscultation bilaterally BACK:  No CVA tenderness CHEST:  Unremarkable HEART:  PMI not displaced or sustained,S1 and S2 within normal limits, no S3, no S4, no clicks, no rubs, no murmurs ABD:  Flat, positive bowel sounds normal in frequency in pitch, no bruits, no rebound, no guarding, no midline pulsatile mass, no hepatomegaly, no splenomegaly EXT:  2 plus pulses throughout, no edema, no cyanosis no clubbing SKIN:  No rashes no nodules NEURO:  Cranial nerves II through XII grossly intact, motor grossly intact throughout PSYCH:  Cognitively intact, oriented to person place and time    EKG:  EKG is ordered today. The ekg ordered today demonstrates sinus rhythm, rate 78, axis within normal limits, intervals within normal limits, no acute ST-T wave changes.   Recent Labs: No results found for requested labs within last 365 days.    Lipid Panel No  results found for: "CHOL", "TRIG", "HDL", "CHOLHDL", "VLDL", "LDLCALC", "LDLDIRECT"    Wt Readings from Last 3 Encounters:  02/12/23 140 lb 12.8 oz (63.9 kg)  01/07/23 140 lb (63.5 kg)  06/14/22 148 lb (67.1 kg)      Other studies Reviewed: Additional studies/ records that were reviewed today include: Labs. Review of the above records demonstrates:  Please see elsewhere in the note.     ASSESSMENT AND PLAN:  CORONARY CALCIUM: She has no symptoms related to this.  She does have a very good functional level.  We will pursue primary risk reduction.  I do not think further testing is indicated.  HTN: Blood pressure is at target.  No change in therapy.  DM:   A1c was 6.3.  No change in therapy.  DYSLIPIDEMIA:   LDL 116.  HDL 53.  I think she has reasonable lipids.  No change in therapy.  BRUIT: I will check carotid Dopplers   Current medicines are reviewed at length with the patient today.  The patient does not have concerns regarding medicines.  The following changes have been made:  no change  Labs/ tests ordered today include:   Orders Placed This Encounter  Procedures   EKG 12-Lead   VAS US CAROTID     Disposition:   FU with me as needed    Signed, Rollene Rotunda, MD  02/12/2023 11:05 AM    [facility]

## 2023-02-10 ENCOUNTER — Other Ambulatory Visit: Payer: Self-pay | Admitting: Internal Medicine

## 2023-02-11 DIAGNOSIS — M6281 Muscle weakness (generalized): Secondary | ICD-10-CM | POA: Diagnosis not present

## 2023-02-11 DIAGNOSIS — R2681 Unsteadiness on feet: Secondary | ICD-10-CM | POA: Diagnosis not present

## 2023-02-11 DIAGNOSIS — R42 Dizziness and giddiness: Secondary | ICD-10-CM | POA: Diagnosis not present

## 2023-02-11 DIAGNOSIS — Z9181 History of falling: Secondary | ICD-10-CM | POA: Diagnosis not present

## 2023-02-12 ENCOUNTER — Encounter: Payer: Self-pay | Admitting: Cardiology

## 2023-02-12 ENCOUNTER — Ambulatory Visit: Payer: PPO | Attending: Cardiology | Admitting: Cardiology

## 2023-02-12 ENCOUNTER — Other Ambulatory Visit: Payer: Self-pay | Admitting: Orthopedic Surgery

## 2023-02-12 ENCOUNTER — Telehealth: Payer: Self-pay | Admitting: Cardiology

## 2023-02-12 VITALS — BP 112/62 | HR 78 | Ht 61.0 in | Wt 140.8 lb

## 2023-02-12 DIAGNOSIS — G5601 Carpal tunnel syndrome, right upper limb: Secondary | ICD-10-CM | POA: Diagnosis not present

## 2023-02-12 DIAGNOSIS — M65342 Trigger finger, left ring finger: Secondary | ICD-10-CM | POA: Diagnosis not present

## 2023-02-12 DIAGNOSIS — R931 Abnormal findings on diagnostic imaging of heart and coronary circulation: Secondary | ICD-10-CM | POA: Diagnosis not present

## 2023-02-12 DIAGNOSIS — R0989 Other specified symptoms and signs involving the circulatory and respiratory systems: Secondary | ICD-10-CM

## 2023-02-12 NOTE — Patient Instructions (Signed)
Medication Instructions:  Continue same medications   Lab Work: None  ordered   Testing/Procedures: Carotid doppler   Follow-Up: At [facility], you and your health needs are our priority.  As part of our continuing mission to provide you with exceptional heart care, we have created designated Provider Care Teams.  These Care Teams include your primary Cardiologist (physician) and Advanced Practice Providers (APPs -  Physician Assistants and Nurse Practitioners) who all work together to provide you with the care you need, when you need it.  We recommend signing up for the patient portal called "MyChart".  Sign up information is provided on this After Visit Summary.  MyChart is used to connect with patients for Virtual Visits (Telemedicine).  Patients are able to view lab/test results, encounter notes, upcoming appointments, etc.  Non-urgent messages can be sent to your provider as well.   To learn more about what you can do with MyChart, go to [email or web address].    Your next appointment:  As Needed    Provider:  Dr.Hochrein

## 2023-02-12 NOTE — Telephone Encounter (Signed)
   Primary Cardiologist: None  Chart reviewed as part of pre-operative protocol coverage. Given past medical history and time since last visit, based on ACC/AHA guidelines, Adrienne [name] would be at acceptable risk for the planned procedure without further cardiovascular testing.   Patient was advised that if she develops new symptoms prior to surgery to contact our office to arrange a follow-up appointment.  He verbalized understanding.  From a cardiac perspective, she can hold aspirin for 7 days prior to procedure.   I will route this recommendation to the requesting party via Epic fax function and remove from pre-op pool.  Please call with questions.  Levi Aland, NP-C  02/12/2023, 4:30 PM [address] Office [phone] Fax [phone]

## 2023-02-12 NOTE — Telephone Encounter (Signed)
   Pre-operative Risk Assessment    Patient Name: Adrienne [name]  DOB: 10/09/1950 MRN: [id number]      Request for Surgical Clearance    Procedure:   Right Carpel Tunnel Release   Date of Surgery:  Clearance 03/06/23                                 Surgeon:  Dr. Betha Loa    Surgeon's Group or Practice Name:  [facility]  Phone number:  [phone] Fax number:  [phone]   Type of Clearance Requested:   - Medical  - Pharmacy:  Hold Aspirin Need to know how long to hold    Type of Anesthesia:   Choice    Additional requests/questions:    Signed, April Henson   02/12/2023, 4:14 PM

## 2023-02-13 ENCOUNTER — Ambulatory Visit (HOSPITAL_COMMUNITY)
Admission: RE | Admit: 2023-02-13 | Discharge: 2023-02-13 | Disposition: A | Discharge status: Home / Self Care | Payer: PPO | Source: Ambulatory Visit | Attending: Internal Medicine | Admitting: Internal Medicine

## 2023-02-13 DIAGNOSIS — R931 Abnormal findings on diagnostic imaging of heart and coronary circulation: Secondary | ICD-10-CM | POA: Diagnosis not present

## 2023-02-13 DIAGNOSIS — R0989 Other specified symptoms and signs involving the circulatory and respiratory systems: Secondary | ICD-10-CM | POA: Diagnosis not present

## 2023-02-14 ENCOUNTER — Encounter: Payer: Self-pay | Admitting: Cardiology

## 2023-02-26 ENCOUNTER — Encounter (HOSPITAL_BASED_OUTPATIENT_CLINIC_OR_DEPARTMENT_OTHER): Payer: Self-pay | Admitting: Orthopedic Surgery

## 2023-02-26 DIAGNOSIS — R2681 Unsteadiness on feet: Secondary | ICD-10-CM | POA: Diagnosis not present

## 2023-02-26 DIAGNOSIS — M6281 Muscle weakness (generalized): Secondary | ICD-10-CM | POA: Diagnosis not present

## 2023-02-26 DIAGNOSIS — R42 Dizziness and giddiness: Secondary | ICD-10-CM | POA: Diagnosis not present

## 2023-02-26 DIAGNOSIS — Z9181 History of falling: Secondary | ICD-10-CM | POA: Diagnosis not present

## 2023-03-03 ENCOUNTER — Encounter (HOSPITAL_BASED_OUTPATIENT_CLINIC_OR_DEPARTMENT_OTHER)
Admission: RE | Admit: 2023-03-03 | Discharge: 2023-03-03 | Disposition: A | Discharge status: Home / Self Care | Payer: PPO | Source: Ambulatory Visit | Attending: Orthopedic Surgery | Admitting: Orthopedic Surgery

## 2023-03-03 DIAGNOSIS — Z01812 Encounter for preprocedural laboratory examination: Secondary | ICD-10-CM | POA: Insufficient documentation

## 2023-03-03 LAB — BASIC METABOLIC PANEL
Anion gap: 10 (ref 5–15)
BUN: 12 mg/dL (ref 8–23)
CO2: 29 mmol/L (ref 22–32)
Calcium: 9.5 mg/dL (ref 8.9–10.3)
Chloride: 101 mmol/L (ref 98–111)
Creatinine, Ser: 0.72 mg/dL (ref 0.44–1.00)
GFR, Estimated: >60 mL/min (ref >60)
Glucose, Bld: 75 mg/dL (ref 70–99)
Potassium: 4.4 mmol/L (ref 3.5–5.1)
Sodium: 140 mmol/L (ref 135–145)

## 2023-03-06 ENCOUNTER — Ambulatory Visit (HOSPITAL_BASED_OUTPATIENT_CLINIC_OR_DEPARTMENT_OTHER): Payer: PPO | Admitting: Anesthesiology

## 2023-03-06 ENCOUNTER — Other Ambulatory Visit: Payer: Self-pay

## 2023-03-06 ENCOUNTER — Encounter (HOSPITAL_BASED_OUTPATIENT_CLINIC_OR_DEPARTMENT_OTHER): Payer: Self-pay | Admitting: Orthopedic Surgery

## 2023-03-06 ENCOUNTER — Encounter (HOSPITAL_BASED_OUTPATIENT_CLINIC_OR_DEPARTMENT_OTHER)
Admission: RE | Disposition: A | Discharge status: Home / Self Care | Payer: Self-pay | Source: Home / Self Care | Attending: Orthopedic Surgery

## 2023-03-06 ENCOUNTER — Ambulatory Visit (HOSPITAL_BASED_OUTPATIENT_CLINIC_OR_DEPARTMENT_OTHER)
Admission: RE | Admit: 2023-03-06 | Discharge: 2023-03-06 | Disposition: A | Discharge status: Home / Self Care | Payer: PPO | Attending: Orthopedic Surgery | Admitting: Orthopedic Surgery

## 2023-03-06 DIAGNOSIS — I2510 Atherosclerotic heart disease of native coronary artery without angina pectoris: Secondary | ICD-10-CM | POA: Insufficient documentation

## 2023-03-06 DIAGNOSIS — Z79899 Other long term (current) drug therapy: Secondary | ICD-10-CM

## 2023-03-06 DIAGNOSIS — Z7984 Long term (current) use of oral hypoglycemic drugs: Secondary | ICD-10-CM | POA: Diagnosis not present

## 2023-03-06 DIAGNOSIS — Z794 Long term (current) use of insulin: Secondary | ICD-10-CM | POA: Diagnosis not present

## 2023-03-06 DIAGNOSIS — G5601 Carpal tunnel syndrome, right upper limb: Secondary | ICD-10-CM | POA: Insufficient documentation

## 2023-03-06 DIAGNOSIS — I10 Essential (primary) hypertension: Secondary | ICD-10-CM | POA: Insufficient documentation

## 2023-03-06 DIAGNOSIS — E119 Type 2 diabetes mellitus without complications: Secondary | ICD-10-CM | POA: Diagnosis not present

## 2023-03-06 DIAGNOSIS — Z01818 Encounter for other preprocedural examination: Secondary | ICD-10-CM

## 2023-03-06 HISTORY — PX: CARPAL TUNNEL RELEASE: SHX101

## 2023-03-06 LAB — GLUCOSE, CAPILLARY
Glucose-Capillary: 79 mg/dL (ref 70–99)
Glucose-Capillary: 106 mg/dL — ABNORMAL HIGH (ref 70–99)

## 2023-03-06 SURGERY — CARPAL TUNNEL RELEASE
Anesthesia: General | Site: Wrist | Laterality: Right

## 2023-03-06 MED ORDER — LACTATED RINGERS IV SOLN: INTRAVENOUS | Status: DC

## 2023-03-06 MED ORDER — ONDANSETRON HCL 4 MG/2ML IJ SOLN
INTRAMUSCULAR | Status: DC | PRN
  Administered 2023-03-06: 4 mg via INTRAVENOUS

## 2023-03-06 MED ORDER — PROPOFOL 10 MG/ML IV BOLUS
INTRAVENOUS | Status: DC | PRN
  Administered 2023-03-06: 120 mg via INTRAVENOUS

## 2023-03-06 MED ORDER — 0.9 % SODIUM CHLORIDE (POUR BTL) OPTIME
TOPICAL | Status: DC | PRN
  Administered 2023-03-06: 100 mL

## 2023-03-06 MED ORDER — FENTANYL CITRATE (PF) 100 MCG/2ML IJ SOLN
INTRAMUSCULAR | Status: AC
  Filled 2023-03-06: qty 2

## 2023-03-06 MED ORDER — CEFAZOLIN SODIUM-DEXTROSE 2-4 GM/100ML-% IV SOLN
2.0000 g | INTRAVENOUS | Status: AC
  Administered 2023-03-06: 2 g via INTRAVENOUS

## 2023-03-06 MED ORDER — BUPIVACAINE HCL (PF) 0.25 % IJ SOLN
INTRAMUSCULAR | Status: AC
  Filled 2023-03-06: qty 90

## 2023-03-06 MED ORDER — MIDAZOLAM HCL 2 MG/2ML IJ SOLN
INTRAMUSCULAR | Status: AC
  Filled 2023-03-06: qty 2

## 2023-03-06 MED ORDER — ONDANSETRON HCL 4 MG/2ML IJ SOLN
INTRAMUSCULAR | Status: AC
  Filled 2023-03-06: qty 2

## 2023-03-06 MED ORDER — PROMETHAZINE HCL 25 MG/ML IJ SOLN: 6.2500 mg | INTRAMUSCULAR | Status: DC | PRN

## 2023-03-06 MED ORDER — PROPOFOL 10 MG/ML IV BOLUS
INTRAVENOUS | Status: AC
  Filled 2023-03-06: qty 20

## 2023-03-06 MED ORDER — OXYCODONE HCL 5 MG/5ML PO SOLN: 5.0000 mg | Freq: Once | ORAL | Status: DC | PRN

## 2023-03-06 MED ORDER — OXYCODONE HCL 5 MG PO TABS: 5.0000 mg | ORAL_TABLET | Freq: Once | ORAL | Status: DC | PRN

## 2023-03-06 MED ORDER — HYDROMORPHONE HCL 1 MG/ML IJ SOLN: 0.2500 mg | INTRAMUSCULAR | Status: DC | PRN

## 2023-03-06 MED ORDER — BUPIVACAINE HCL (PF) 0.25 % IJ SOLN
INTRAMUSCULAR | Status: DC | PRN
  Administered 2023-03-06: 9 mL

## 2023-03-06 MED ORDER — CEFAZOLIN SODIUM-DEXTROSE 2-4 GM/100ML-% IV SOLN
INTRAVENOUS | Status: AC
  Filled 2023-03-06: qty 100

## 2023-03-06 MED ORDER — LIDOCAINE HCL (CARDIAC) PF 100 MG/5ML IV SOSY
PREFILLED_SYRINGE | INTRAVENOUS | Status: DC | PRN
  Administered 2023-03-06: 40 mg via INTRATRACHEAL

## 2023-03-06 MED ORDER — MIDAZOLAM HCL 5 MG/5ML IJ SOLN
INTRAMUSCULAR | Status: DC | PRN
  Administered 2023-03-06: 1 mg via INTRAVENOUS

## 2023-03-06 MED ORDER — DEXAMETHASONE SODIUM PHOSPHATE 10 MG/ML IJ SOLN
INTRAMUSCULAR | Status: DC | PRN
  Administered 2023-03-06: 5 mg via INTRAVENOUS

## 2023-03-06 MED ORDER — AMISULPRIDE (ANTIEMETIC) 5 MG/2ML IV SOLN: 10.0000 mg | Freq: Once | INTRAVENOUS | Status: DC | PRN

## 2023-03-06 MED ORDER — FENTANYL CITRATE (PF) 100 MCG/2ML IJ SOLN
INTRAMUSCULAR | Status: DC | PRN
  Administered 2023-03-06: 50 ug via INTRAVENOUS

## 2023-03-06 SURGICAL SUPPLY — 35 items
APL PRP STRL LF DISP 70% ISPRP (MISCELLANEOUS) ×1
BLADE SURG 15 STRL LF DISP TIS (BLADE) ×2 IMPLANT
BLADE SURG 15 STRL SS (BLADE) ×2
BNDG CMPR 5X3 KNIT ELC UNQ LF (GAUZE/BANDAGES/DRESSINGS) ×1
BNDG CMPR 9X4 STRL LF SNTH (GAUZE/BANDAGES/DRESSINGS)
BNDG ELASTIC 3INX 5YD STR LF (GAUZE/BANDAGES/DRESSINGS) ×1 IMPLANT
BNDG ESMARK 4X9 LF (GAUZE/BANDAGES/DRESSINGS) IMPLANT
BNDG GAUZE DERMACEA FLUFF 4 (GAUZE/BANDAGES/DRESSINGS) ×1 IMPLANT
BNDG GZE DERMACEA 4 6PLY (GAUZE/BANDAGES/DRESSINGS) ×1
CHLORAPREP W/TINT 26 (MISCELLANEOUS) ×1 IMPLANT
CORD BIPOLAR FORCEPS 12FT (ELECTRODE) ×1 IMPLANT
COVER BACK TABLE 60X90IN (DRAPES) ×1 IMPLANT
COVER MAYO STAND STRL (DRAPES) ×1 IMPLANT
CUFF TOURN SGL QUICK 18X4 (TOURNIQUET CUFF) ×1 IMPLANT
DRAPE EXTREMITY T 121X128X90 (DISPOSABLE) ×1 IMPLANT
DRAPE SURG 17X23 STRL (DRAPES) ×1 IMPLANT
GAUZE PAD ABD 8X10 STRL (GAUZE/BANDAGES/DRESSINGS) ×1 IMPLANT
GAUZE SPONGE 4X4 12PLY STRL (GAUZE/BANDAGES/DRESSINGS) ×1 IMPLANT
GAUZE XEROFORM 1X8 LF (GAUZE/BANDAGES/DRESSINGS) ×1 IMPLANT
GLOVE BIO SURGEON STRL SZ7.5 (GLOVE) ×1 IMPLANT
GLOVE BIOGEL PI IND STRL 8 (GLOVE) ×1 IMPLANT
GOWN STRL REUS W/ TWL LRG LVL3 (GOWN DISPOSABLE) ×1 IMPLANT
GOWN STRL REUS W/TWL LRG LVL3 (GOWN DISPOSABLE) ×1
GOWN STRL REUS W/TWL XL LVL3 (GOWN DISPOSABLE) ×1 IMPLANT
NDL HYPO 25X1 1.5 SAFETY (NEEDLE) ×1 IMPLANT
NEEDLE HYPO 25X1 1.5 SAFETY (NEEDLE) ×1 IMPLANT
NS IRRIG 1000ML POUR BTL (IV SOLUTION) ×1 IMPLANT
PACK BASIN DAY SURGERY FS (CUSTOM PROCEDURE TRAY) ×1 IMPLANT
PADDING CAST ABS COTTON 4X4 ST (CAST SUPPLIES) ×1 IMPLANT
STOCKINETTE 4X48 STRL (DRAPES) ×1 IMPLANT
SUT ETHILON 4 0 PS 2 18 (SUTURE) ×1 IMPLANT
SYR BULB EAR ULCER 3OZ GRN STR (SYRINGE) ×1 IMPLANT
SYR CONTROL 10ML LL (SYRINGE) ×1 IMPLANT
TOWEL GREEN STERILE FF (TOWEL DISPOSABLE) ×2 IMPLANT
UNDERPAD 30X36 HEAVY ABSORB (UNDERPADS AND DIAPERS) ×1 IMPLANT

## 2023-03-06 NOTE — Anesthesia Preprocedure Evaluation (Addendum)
Anesthesia Evaluation  Patient identified by MRN, date of birth, ID band Patient awake    Reviewed: Allergy & Precautions, NPO status , Patient's Chart, lab work & pertinent test results  Airway Mallampati: I  TM Distance: >3 FB Neck ROM: Full    Dental no notable dental hx.    Pulmonary neg pulmonary ROS   Pulmonary exam normal        Cardiovascular hypertension, Pt. on medications + CAD   Rhythm:Regular Rate:Normal     Neuro/Psych   Anxiety Depression    negative neurological ROS  negative psych ROS   GI/Hepatic Neg liver ROS,GERD  ,,  Endo/Other  diabetes, Well Controlled, Type 2, Insulin Dependent    Renal/GU negative Renal ROS  negative genitourinary   Musculoskeletal  (+) Arthritis , Osteoarthritis,    Abdominal   Peds  Hematology negative hematology ROS (+)   Anesthesia Other Findings   Reproductive/Obstetrics                             Anesthesia Physical Anesthesia Plan  ASA: III  Anesthesia Plan: General   Post-op Pain Management: Minimal or no pain anticipated   Induction: Intravenous  PONV Risk Score and Plan: 3 and Treatment may vary due to age or medical condition, Ondansetron, Dexamethasone and Midazolam  Airway Management Planned: LMA  Additional Equipment:   Intra-op Plan:   Post-operative Plan: Extubation in OR  Informed Consent: I have reviewed the patients History and Physical, chart, labs and discussed the procedure including the risks, benefits and alternatives for the proposed anesthesia with the patient or authorized representative who has indicated his/her understanding and acceptance.     Dental advisory given  Plan Discussed with: CRNA and Anesthesiologist  Anesthesia Plan Comments:         Anesthesia Quick Evaluation

## 2023-03-06 NOTE — Discharge Instructions (Addendum)

## 2023-03-06 NOTE — Anesthesia Procedure Notes (Signed)
Procedure Name: LMA Insertion Date/Time: 03/06/2023 9:34 AM  Performed by: Thornell Mule, CRNAPre-anesthesia Checklist: Patient identified, Emergency Drugs available, Suction available and Patient being monitored Patient Re-evaluated:Patient Re-evaluated prior to induction Oxygen Delivery Method: Circle system utilized Preoxygenation: Pre-oxygenation with 100% oxygen Induction Type: IV induction LMA: LMA inserted LMA Size: 4.0 Number of attempts: 1 Placement Confirmation: positive ETCO2 Tube secured with: Tape Dental Injury: Teeth and Oropharynx as per pre-operative assessment

## 2023-03-06 NOTE — H&P (Signed)
Adrienne [name] is an 72 y.o. female.   Chief Complaint: carpal tunnel syndrome HPI: 72 y.o. yo female with numbness and tingling right hand.  Nocturnal symptoms. Positive nerve conduction studies. She wishes to have right carpal tunnel release.   Allergies:  Allergies  Allergen Reactions   Sulfa Antibiotics Rash    Past Medical History:  Diagnosis Date   Arthritis    hands/fingers   Depression    Diabetes (HCC)    Eczema    Esophageal reflux    Esophageal stricture    Generalized anxiety disorder    GERD (gastroesophageal reflux disease)    Hiatal hernia    Hyperlipidemia    Hypertension    states under control with med., has been on med. x 30 yr.   Insulin dependent diabetes mellitus    Irritable bowel syndrome    Nutcracker esophagus    Trigger finger of right hand 09/2017   index and middle fingers   Wears hearing aid in both ears     Past Surgical History:  Procedure Laterality Date   CARPAL TUNNEL RELEASE Left 01/07/2014   Procedure: LEFT CARPAL TUNNEL RELEASE;  Surgeon: Tami Ribas, MD;  Location: [facility];  Service: Orthopedics;  Laterality: Left;   COLONOSCOPY     ESOPHAGEAL MANOMETRY  05/28/2006   ESOPHAGEAL MANOMETRY N/A 11/13/2022   Procedure: ESOPHAGEAL MANOMETRY (EM);  Surgeon: Beverley Fiedler, MD;  Location: [facility];  Service: Gastroenterology;  Laterality: N/A;   EYE SURGERY Right    tear duct repair   NASAL SINUS SURGERY     TONSILLECTOMY     TRIGGER FINGER RELEASE Right 11/06/2017   Procedure: RELEASE TRIGGER FINGER/A-1 PULLEY RIGHT INDEX FINGER AND RIGHT MIDDLE FINGER;  Surgeon: Betha Loa, MD;  Location: [facility];  Service: Orthopedics;  Laterality: Right;   UPPER GASTROINTESTINAL ENDOSCOPY  10/18/2013   with dilation; with Propofol   UPPER GASTROINTESTINAL ENDOSCOPY  10/30/2016    Family History: Family History  Problem Relation Age of Onset   Peripheral vascular disease Mother 34   Anesthesia problems  Mother        post-op N/V; hard to wake up post-op   Diabetes Father    CAD Father 40   AAA (abdominal aortic aneurysm) Father    Diabetes Sister    Stroke Sister    AAA (abdominal aortic aneurysm) Brother    Colon cancer Maternal Grandmother    Stomach cancer Maternal Grandfather    Prostate cancer Paternal Grandfather    Inflammatory bowel disease Grandchild    Esophageal cancer Neg Hx    Rectal cancer Neg Hx     Social History:   reports that she has never smoked. She has never used smokeless tobacco. She reports current alcohol use. She reports that she does not use drugs.  Medications: Facility-Administered Medications Prior to Admission  Medication Dose Route Frequency Provider Last Rate Last Admin   dexamethasone (DECADRON) injection 2 mg  2 mg Other Once Freddie Breech, DPM       Medications Prior to Admission  Medication Sig Dispense Refill   ALPRAZolam (XANAX) 0.25 MG tablet TAKE 1 TABLET BY MOUTH ONCE DAILY AS NEEDED FOR ANXIETY OR SLEEP  0   aspirin 81 MG tablet Take 81 mg by mouth daily.     calcium carbonate (OS-CAL) 600 MG TABS tablet Take 500 mg by mouth 2 (two) times daily with a meal.     celecoxib (CELEBREX) 200 MG capsule  Take 200 mg by mouth daily.     Cetirizine HCl 10 MG CAPS Take 10 mg by mouth as needed.     diltiazem (CARDIZEM) 30 MG tablet Take 1 tablet by mouth 4 times daily 120 tablet 0   empagliflozin (JARDIANCE) 25 MG TABS tablet Take 25 mg by mouth daily.     Ferrous Sulfate (IRON SLOW RELEASE) 140 (45 Fe) MG TBCR Take 1 tablet by mouth daily.     fluticasone (FLONASE) 50 MCG/ACT nasal spray Place into the nose.     furosemide (LASIX) 20 MG tablet furosemide 20 mg tablet     ibuprofen (ADVIL) 600 MG tablet Take 600 mg by mouth every 6 (six) hours as needed.     insulin glargine (LANTUS) 100 UNIT/ML injection Inject 26 Units into the skin daily.     Iron-Folic Acid-Vit B12 (IRON FORMULA PO) Take by mouth.     isosorbide mononitrate (IMDUR) 30  MG 24 hr tablet Take 15 mg by mouth daily.     metFORMIN (GLUCOPHAGE) 1000 MG tablet Take 1,000 mg by mouth 2 (two) times daily with a meal.      Multiple Vitamin (MULTIVITAMIN) tablet Take 1 tablet by mouth daily.     pantoprazole (PROTONIX) 40 MG tablet Take 1 tablet (40 mg total) by mouth daily. 30 tablet 3   ramipril (ALTACE) 10 MG tablet Take 10 mg by mouth daily.     simvastatin (ZOCOR) 40 MG tablet Take 1 tablet by mouth every evening. Take half tablet daily.     traMADol (ULTRAM) 50 MG tablet Take 50 mg by mouth every 6 (six) hours as needed.     TRULICITY 4.5 MG/0.5ML SOPN Inject 4.5 mg into the skin once a week. Tuesday     venlafaxine XR (EFFEXOR-XR) 37.5 MG 24 hr capsule Take 1 capsule by mouth. DAILY     azelastine (ASTELIN) 0.1 % nasal spray azelastine 137 mcg (0.1 %) nasal spray aerosol (Patient not taking: Reported on 02/12/2023)     Continuous Glucose Sensor (FREESTYLE LIBRE 3 SENSOR) MISC Inject into the skin daily in the afternoon.     diphenoxylate-atropine (LOMOTIL) 2.5-0.025 MG tablet Take 2 tablets by mouth 4 (four) times daily as needed. (Patient not taking: Reported on 02/12/2023)     ONETOUCH VERIO test strip daily. (Patient not taking: Reported on 02/12/2023)      Results for orders placed or performed during the hospital encounter of 03/06/23 (from the past 48 hour(s))  Glucose, capillary     Status: None   Collection Time: 03/06/23  7:26 AM  Result Value Ref Range   Glucose-Capillary 79 70 - 99 mg/dL    Comment: Glucose reference range applies only to samples taken after fasting for at least 8 hours.    No results found.    Blood pressure (!) 148/60, pulse 78, temperature 98.2 F (36.8 C), temperature source Oral, resp. rate 16, height 5\' 1"  (1.549 m), weight 63.1 kg, SpO2 100 %.  General appearance: alert, cooperative, and appears stated age Head: Normocephalic, without obvious abnormality, atraumatic Neck: supple, symmetrical, trachea midline Extremities:  Intact sensation and capillary refill all digits.  +epl/fpl/io.  No wounds.  Pulses: 2+ and symmetric Skin: Skin color, texture, turgor normal. No rashes or lesions Neurologic: Grossly normal Incision/Wound: none  Assessment/Plan Right carpal tunnel syndrome.  Non operative and operative treatment options have been discussed with the patient and patient wishes to proceed with operative treatment. Risks, benefits, and alternatives of surgery have been  discussed and the patient agrees with the plan of care.   Betha Loa 03/06/2023, 9:21 AM

## 2023-03-06 NOTE — Anesthesia Postprocedure Evaluation (Signed)
Anesthesia Post Note  Patient: Adrienne [name]  Procedure(s) Performed: RIGHT CARPAL TUNNEL RELEASE (Right: Wrist)     Patient location during evaluation: PACU Anesthesia Type: General Level of consciousness: awake and alert Pain management: pain level controlled Vital Signs Assessment: post-procedure vital signs reviewed and stable Respiratory status: spontaneous breathing, nonlabored ventilation and respiratory function stable Cardiovascular status: blood pressure returned to baseline and stable Postop Assessment: no apparent nausea or vomiting Anesthetic complications: no   No notable events documented.  Last Vitals:  Vitals:   03/06/23 1015 03/06/23 1032  BP: (!) 151/65 (!) 160/81  Pulse: 70 72  Resp: 11 12  Temp:  (!) 36.3 C  SpO2: 98% 93%    Last Pain:  Vitals:   03/06/23 1032  TempSrc:   PainSc: 0-No pain                 Lowella Curb

## 2023-03-06 NOTE — Transfer of Care (Signed)
Immediate Anesthesia Transfer of Care Note  Patient: REMMI LANGERMAN  Procedure(s) Performed: RIGHT CARPAL TUNNEL RELEASE (Right: Wrist)  Patient Location: PACU  Anesthesia Type:General  Level of Consciousness: drowsy and patient cooperative  Airway & Oxygen Therapy: Patient Spontanous Breathing and Patient connected to face mask oxygen  Post-op Assessment: Report given to RN and Post -op Vital signs reviewed and stable  Post vital signs: Reviewed and stable  Last Vitals:  Vitals Value Taken Time  BP    Temp    Pulse 72 03/06/23 0959  Resp 11 03/06/23 0959  SpO2 98 % 03/06/23 0959  Vitals shown include unvalidated device data.  Last Pain:  Vitals:   03/06/23 0735  TempSrc: Oral  PainSc: 0-No pain         Complications: No notable events documented.

## 2023-03-06 NOTE — Op Note (Signed)
03/06/2023 [facility]                              OPERATIVE REPORT   PREOPERATIVE DIAGNOSIS:  Right carpal tunnel syndrome.  POSTOPERATIVE DIAGNOSIS:  Right carpal tunnel syndrome.  PROCEDURE:  Right carpal tunnel release.  SURGEON:  Betha Loa, MD  ASSISTANT:  none.  ANESTHESIA: General  IV FLUIDS:  Per anesthesia flow sheet.  ESTIMATED BLOOD LOSS:  Minimal.  COMPLICATIONS:  None.  SPECIMENS:  None.  TOURNIQUET TIME:    Total Tourniquet Time Documented: Upper Arm (Right) - 9 minutes Total: Upper Arm (Right) - 9 minutes   DISPOSITION:  Stable to PACU.  LOCATION: [facility]  INDICATIONS:  72 y.o. yo female with numbness and tingling right hand.  Nocturnal symptoms. Positive nerve conduction studies. She wishes to proceed with right carpal tunnel release.  Risks, benefits and alternatives of surgery were discussed including the risk of blood loss; infection; damage to nerves, vessels, tendons, ligaments, bone; failure of surgery; need for additional surgery; complications with wound healing; continued pain; recurrence of carpal tunnel syndrome; and damage to motor branch. She voiced understanding of these risks and elected to proceed.   OPERATIVE COURSE:  After being identified preoperatively by myself, the patient and I agreed upon the procedure and site of procedure.  The surgical site was marked.  Surgical consent had been signed.  She was given IV Ancef as preoperative antibiotic prophylaxis.  She was transferred to the operating room and placed on the operating room table in supine position with the Right upper extremity on an armboard.  General anesthesia was induced by the anesthesiologist.  Right upper extremity was prepped and draped in normal sterile orthopaedic fashion.  A surgical pause was performed between the surgeons, anesthesia, and operating room staff, and all were in agreement as to the patient, procedure, and site of procedure.   Tourniquet at the proximal aspect of the extremity was inflated to 250 mmHg after exsanguination of the arm with an Esmarch bandage  Incision was made over the transverse carpal ligament and carried into the subcutaneous tissues by spreading technique.  Bipolar electrocautery was used to obtain hemostasis.  The palmar fascia was sharply incised.  The transverse carpal ligament was identified.  The fascia distal to the ligament was opened.  Retractor was placed and the flexor tendons were identified.  The flexor tendon to the little finger was identified and retracted radially.  The transverse carpal ligament was then incised from distal to proximal under direct visualization.  Scissors were used to split the distal aspect of the volar antebrachial fascia.  A finger was placed into the wound to ensure complete decompression, which was the case.  The nerve was examined.  It was flattened and hyperemic.  The motor branch was identified and was intact.  The wound was copiously irrigated with sterile saline.  It was then closed with 4-0 nylon in a horizontal mattress fashion.  It was injected with 0.25% plain Marcaine to aid in postoperative analgesia.  It was dressed with sterile Xeroform, 4x4s, an ABD, and wrapped with Kerlix and an Ace bandage.  Tourniquet was deflated at 9 minutes.  Fingertips were pink with brisk capillary refill after deflation of the tourniquet.  Operative drapes were broken down.  The patient was awoken from anesthesia safely.  She was transferred back to stretcher and taken to the PACU in stable condition.  I will see her back in the office in 1 week for postoperative followup.  She states she has tramadol at home already and plans to take that as needed for pain.    Betha Loa, MD Electronically signed, 03/06/23

## 2023-03-08 ENCOUNTER — Encounter (HOSPITAL_BASED_OUTPATIENT_CLINIC_OR_DEPARTMENT_OTHER): Payer: Self-pay | Admitting: Orthopedic Surgery

## 2023-03-13 DIAGNOSIS — G5603 Carpal tunnel syndrome, bilateral upper limbs: Secondary | ICD-10-CM | POA: Diagnosis not present

## 2023-03-18 DIAGNOSIS — I10 Essential (primary) hypertension: Secondary | ICD-10-CM | POA: Diagnosis not present

## 2023-03-18 DIAGNOSIS — E119 Type 2 diabetes mellitus without complications: Secondary | ICD-10-CM | POA: Diagnosis not present

## 2023-03-18 DIAGNOSIS — Z794 Long term (current) use of insulin: Secondary | ICD-10-CM | POA: Diagnosis not present

## 2023-03-18 DIAGNOSIS — K224 Dyskinesia of esophagus: Secondary | ICD-10-CM | POA: Diagnosis not present

## 2023-03-18 DIAGNOSIS — I2510 Atherosclerotic heart disease of native coronary artery without angina pectoris: Secondary | ICD-10-CM | POA: Diagnosis not present

## 2023-03-19 ENCOUNTER — Other Ambulatory Visit: Payer: Self-pay | Admitting: Internal Medicine

## 2023-03-21 DIAGNOSIS — G5601 Carpal tunnel syndrome, right upper limb: Secondary | ICD-10-CM | POA: Diagnosis not present

## 2023-03-21 DIAGNOSIS — M65342 Trigger finger, left ring finger: Secondary | ICD-10-CM | POA: Diagnosis not present

## 2023-03-21 DIAGNOSIS — M65312 Trigger thumb, left thumb: Secondary | ICD-10-CM | POA: Diagnosis not present

## 2023-04-16 DIAGNOSIS — M65312 Trigger thumb, left thumb: Secondary | ICD-10-CM | POA: Diagnosis not present

## 2023-04-16 DIAGNOSIS — G5601 Carpal tunnel syndrome, right upper limb: Secondary | ICD-10-CM | POA: Diagnosis not present

## 2023-04-16 DIAGNOSIS — M65342 Trigger finger, left ring finger: Secondary | ICD-10-CM | POA: Diagnosis not present

## 2023-04-23 ENCOUNTER — Other Ambulatory Visit: Payer: Self-pay | Admitting: Internal Medicine

## 2023-04-30 ENCOUNTER — Ambulatory Visit: Payer: PPO | Admitting: Internal Medicine

## 2023-04-30 ENCOUNTER — Encounter: Payer: Self-pay | Admitting: Internal Medicine

## 2023-04-30 VITALS — BP 92/48 | HR 84 | Ht 60.0 in | Wt 131.5 lb

## 2023-04-30 DIAGNOSIS — K580 Irritable bowel syndrome with diarrhea: Secondary | ICD-10-CM | POA: Diagnosis not present

## 2023-04-30 DIAGNOSIS — I959 Hypotension, unspecified: Secondary | ICD-10-CM

## 2023-04-30 DIAGNOSIS — K224 Dyskinesia of esophagus: Secondary | ICD-10-CM

## 2023-04-30 DIAGNOSIS — I2510 Atherosclerotic heart disease of native coronary artery without angina pectoris: Secondary | ICD-10-CM | POA: Diagnosis not present

## 2023-04-30 DIAGNOSIS — I10 Essential (primary) hypertension: Secondary | ICD-10-CM | POA: Diagnosis not present

## 2023-04-30 DIAGNOSIS — R1310 Dysphagia, unspecified: Secondary | ICD-10-CM | POA: Diagnosis not present

## 2023-04-30 DIAGNOSIS — Z86018 Personal history of other benign neoplasm: Secondary | ICD-10-CM

## 2023-04-30 DIAGNOSIS — Z794 Long term (current) use of insulin: Secondary | ICD-10-CM | POA: Diagnosis not present

## 2023-04-30 DIAGNOSIS — K219 Gastro-esophageal reflux disease without esophagitis: Secondary | ICD-10-CM

## 2023-04-30 DIAGNOSIS — E119 Type 2 diabetes mellitus without complications: Secondary | ICD-10-CM | POA: Diagnosis not present

## 2023-04-30 MED ORDER — PANTOPRAZOLE SODIUM 40 MG PO TBEC: 40.0000 mg | DELAYED_RELEASE_TABLET | Freq: Two times a day (BID) | ORAL | 11 refills | Status: DC

## 2023-04-30 MED ORDER — DILTIAZEM HCL ER 120 MG PO TB24: 120.0000 mg | ORAL_TABLET | Freq: Every day | ORAL | 2 refills | Status: DC

## 2023-04-30 NOTE — Progress Notes (Signed)
Subjective:    Patient ID: Adrienne [name], female    DOB: [birth year], 72 y.o.   MRN: [id number]  HPI Adrienne [name] is a 72 year old female with history of GERD, esophageal dysmotility/hypercontractile esophagus with dysphagia, IBS-D, anxiety, diabetes, hyperlipidemia who is here for follow-up.  She is here alone today and I last saw her on 01/07/2023.  We started her on low-dose diltiazem 30 mg every 6 hours after her last visit.  She has continued her pantoprazole 40 mg daily.  She reports that her swallowing is "better" but she still has difficulty with some solid foods.  She is also noticed some increased pyrosis and regurgitation.  No odynophagia or pain with swallowing.  She has had 2 falls in the last month.  1 she tripped on her dog pad when coming around a corner.  1 last night while she was going to sit down in her chair but she is not sure why she fell.  No loss of consciousness or head injury.  Her loose stools have not been an issue as she is periodically using tramadol for pain.  She is lost some weight since increase Trulicity in the spring.  She has been able to wean down some of her insulin.  She does continue to use Xanax at night for sleep.   Review of Systems As per HPI, otherwise negative  Current Medications, Allergies, Past Medical History, Past Surgical History, Family History and Social History were reviewed in [employer] record.    Objective:   Physical Exam BP (!) 92/48 (BP Location: Right Arm, Patient Position: Sitting, Cuff Size: Normal)   Pulse 84   Ht 5' (1.524 m)   Wt 131 lb 8 oz (59.6 kg)   BMI 25.68 kg/m  Gen: awake, alert, NAD HEENT: anicteric  CV: RRR, no mrg Abd: soft, NT/ND, +BS throughout Ext: no c/c/e Neuro: nonfocal      Assessment & Plan:  72 year old female with history of GERD, esophageal dysmotility/hypercontractile esophagus with dysphagia, IBS-D, anxiety, diabetes, hyperlipidemia who is here for  follow-up.   GERD with hypercontractile esophagus and dysphagia --symptoms regarding hypercontractile esophagus is dysphagia and not pain.  Diltiazem seems to be subjectively helpful, see #2.  I am going to increase PPI given pyrosis.  May need to repeat EGD for dilation as it did seem to help in the past. -- Increase pantoprazole to 40 mg twice daily AC --Continue Imdur 15 mg a day which she has been on for many years. -- Change diltiazem to the long-acting once daily version 120 mg; stop the short acting version at this time -- Let me know in about 4 weeks and if symptoms fail to improve then repeat EGD in the [facility]  2.  Hypotension --blood pressure is lower than usual for her.  I am concerned that she has had 2 falls.  I recommended that she decrease her ramipril to 5 mg daily which is a 50% dose reduction.  Also recommended she discuss this further with Dr. Wylene Simmer.  She will plan to call his office today -- Reduce ramipril to 5 mg daily -- Follow-up with primary care  3.  IBS-D --previously using Lomotil but not needing this any longer as she is using tramadol intermittently for pain.  4.  History of small adenoma of the colon --consideration of recall in July 2029  Follow-up with [place] in 3 to 4 months, sooner if EGD is needed  30 minutes total spent today including patient facing  time, coordination of care, reviewing medical history/procedures/pertinent radiology studies, and documentation of the encounter.

## 2023-04-30 NOTE — Patient Instructions (Addendum)
Stop short acting diltiazem.  We have sent the following medications to your pharmacy for you to pick up at your convenience: Cardizem 120 mg once daily and pantoprazole 40 mg twice daily.   Decrease your Altace to 1/2 tablet (5 mg) by mouth daily. Please check this dosage with your primary care physician.   _______________________________________________________  If your blood pressure at your visit was 140/90 or greater, please contact your primary care physician to follow up on this.  _______________________________________________________  If you are age 73 or older, your body mass index should be between 23-30. Your Body mass index is 25.68 kg/m. If this is out of the aforementioned range listed, please consider follow up with your Primary Care Provider.  If you are age 59 or younger, your body mass index should be between 19-25. Your Body mass index is 25.68 kg/m. If this is out of the aformentioned range listed, please consider follow up with your Primary Care Provider.   ________________________________________________________  The [facility] providers would like to encourage you to use [facility] to communicate with providers for non-urgent requests or questions.  Due to long hold times on the telephone, sending your provider a message by [facility] may be a faster and more efficient way to get a response.  Please allow 48 business hours for a response.  Please remember that this is for non-urgent requests.  _______________________________________________________

## 2023-05-01 ENCOUNTER — Other Ambulatory Visit (HOSPITAL_COMMUNITY): Payer: Self-pay

## 2023-06-23 DIAGNOSIS — I2584 Coronary atherosclerosis due to calcified coronary lesion: Secondary | ICD-10-CM | POA: Diagnosis not present

## 2023-06-23 DIAGNOSIS — Z794 Long term (current) use of insulin: Secondary | ICD-10-CM | POA: Diagnosis not present

## 2023-06-23 DIAGNOSIS — E7800 Pure hypercholesterolemia, unspecified: Secondary | ICD-10-CM | POA: Diagnosis not present

## 2023-06-23 DIAGNOSIS — I700 Atherosclerosis of aorta: Secondary | ICD-10-CM | POA: Diagnosis not present

## 2023-06-23 DIAGNOSIS — I2510 Atherosclerotic heart disease of native coronary artery without angina pectoris: Secondary | ICD-10-CM | POA: Diagnosis not present

## 2023-06-23 DIAGNOSIS — I10 Essential (primary) hypertension: Secondary | ICD-10-CM | POA: Diagnosis not present

## 2023-06-23 DIAGNOSIS — E119 Type 2 diabetes mellitus without complications: Secondary | ICD-10-CM | POA: Diagnosis not present

## 2023-06-23 DIAGNOSIS — F3130 Bipolar disorder, current episode depressed, mild or moderate severity, unspecified: Secondary | ICD-10-CM | POA: Diagnosis not present

## 2023-06-23 DIAGNOSIS — D692 Other nonthrombocytopenic purpura: Secondary | ICD-10-CM | POA: Diagnosis not present

## 2023-06-23 DIAGNOSIS — I209 Angina pectoris, unspecified: Secondary | ICD-10-CM | POA: Diagnosis not present

## 2023-06-23 DIAGNOSIS — I517 Cardiomegaly: Secondary | ICD-10-CM | POA: Diagnosis not present

## 2023-06-23 DIAGNOSIS — Z23 Encounter for immunization: Secondary | ICD-10-CM | POA: Diagnosis not present

## 2023-07-21 DIAGNOSIS — J0100 Acute maxillary sinusitis, unspecified: Secondary | ICD-10-CM | POA: Diagnosis not present

## 2023-07-21 DIAGNOSIS — R070 Pain in throat: Secondary | ICD-10-CM | POA: Diagnosis not present

## 2023-07-31 DIAGNOSIS — L814 Other melanin hyperpigmentation: Secondary | ICD-10-CM | POA: Diagnosis not present

## 2023-07-31 DIAGNOSIS — D225 Melanocytic nevi of trunk: Secondary | ICD-10-CM | POA: Diagnosis not present

## 2023-07-31 DIAGNOSIS — D1801 Hemangioma of skin and subcutaneous tissue: Secondary | ICD-10-CM | POA: Diagnosis not present

## 2023-07-31 DIAGNOSIS — K130 Diseases of lips: Secondary | ICD-10-CM | POA: Diagnosis not present

## 2023-07-31 DIAGNOSIS — Z85828 Personal history of other malignant neoplasm of skin: Secondary | ICD-10-CM | POA: Diagnosis not present

## 2023-07-31 DIAGNOSIS — L821 Other seborrheic keratosis: Secondary | ICD-10-CM | POA: Diagnosis not present

## 2023-07-31 DIAGNOSIS — D692 Other nonthrombocytopenic purpura: Secondary | ICD-10-CM | POA: Diagnosis not present

## 2023-08-06 ENCOUNTER — Other Ambulatory Visit: Payer: Self-pay | Admitting: Internal Medicine

## 2023-09-03 DIAGNOSIS — E119 Type 2 diabetes mellitus without complications: Secondary | ICD-10-CM | POA: Diagnosis not present

## 2023-09-03 DIAGNOSIS — Z794 Long term (current) use of insulin: Secondary | ICD-10-CM | POA: Diagnosis not present

## 2023-09-03 DIAGNOSIS — I10 Essential (primary) hypertension: Secondary | ICD-10-CM | POA: Diagnosis not present

## 2023-09-03 DIAGNOSIS — I2510 Atherosclerotic heart disease of native coronary artery without angina pectoris: Secondary | ICD-10-CM | POA: Diagnosis not present

## 2023-09-03 DIAGNOSIS — Z7689 Persons encountering health services in other specified circumstances: Secondary | ICD-10-CM | POA: Diagnosis not present

## 2023-09-08 ENCOUNTER — Other Ambulatory Visit: Payer: Self-pay | Admitting: Internal Medicine

## 2023-09-11 DIAGNOSIS — Z20822 Contact with and (suspected) exposure to covid-19: Secondary | ICD-10-CM | POA: Diagnosis not present

## 2023-09-11 DIAGNOSIS — J988 Other specified respiratory disorders: Secondary | ICD-10-CM | POA: Diagnosis not present

## 2023-09-11 DIAGNOSIS — B9689 Other specified bacterial agents as the cause of diseases classified elsewhere: Secondary | ICD-10-CM | POA: Diagnosis not present

## 2023-09-22 ENCOUNTER — Ambulatory Visit
Admission: RE | Admit: 2023-09-22 | Discharge: 2023-09-22 | Disposition: A | Discharge status: Home / Self Care | Payer: PPO | Source: Ambulatory Visit | Attending: Internal Medicine | Admitting: Internal Medicine

## 2023-09-22 ENCOUNTER — Other Ambulatory Visit: Payer: Self-pay | Admitting: Internal Medicine

## 2023-09-22 ENCOUNTER — Telehealth: Payer: Self-pay | Admitting: Internal Medicine

## 2023-09-22 DIAGNOSIS — Z1231 Encounter for screening mammogram for malignant neoplasm of breast: Secondary | ICD-10-CM | POA: Diagnosis not present

## 2023-09-22 NOTE — Telephone Encounter (Signed)
Pt said she is taking Diltiazem but she doing better but still having problems swallowing

## 2023-09-26 NOTE — Telephone Encounter (Signed)
Returned patient call & she is still having difficulty swallowing. She's taking diltiazem daily & pantoprazole BID. Feels that the diltiazem has helped, but feels she may still need EGD. She's having to drink more liquids. She's been advised that Dr. Rhea Belton will return on Monday & will have him review, and then his nurse will be back in touch with her with any recommendations. Last seen for OV 03/2023.

## 2023-09-29 NOTE — Telephone Encounter (Signed)
Options include repeat EGD to see if dilation is helpful which has been in the past but predominantly hypercontractile esophagus often responds better to diltiazem At last check she was on 120 mg long-acting diltiazem daily, we could increase this to 240 mg long-acting if she is willing

## 2023-10-02 ENCOUNTER — Other Ambulatory Visit: Payer: Self-pay

## 2023-10-02 MED ORDER — DILTIAZEM HCL ER 240 MG PO TB24: 240.0000 mg | ORAL_TABLET | Freq: Every day | ORAL | 0 refills | Status: DC

## 2023-10-02 NOTE — Telephone Encounter (Signed)
 Spoke with patient regarding MD recommendations & she would prefer to start off with increasing the medication first. She's been advised to give us  a call with an update in a couple of weeks, sooner if needed. Prescription sent to pharmacy. Pt verbalized all understanding.

## 2023-10-08 DIAGNOSIS — M62830 Muscle spasm of back: Secondary | ICD-10-CM | POA: Diagnosis not present

## 2023-10-08 DIAGNOSIS — M9901 Segmental and somatic dysfunction of cervical region: Secondary | ICD-10-CM | POA: Diagnosis not present

## 2023-10-08 DIAGNOSIS — M9902 Segmental and somatic dysfunction of thoracic region: Secondary | ICD-10-CM | POA: Diagnosis not present

## 2023-10-08 DIAGNOSIS — M9903 Segmental and somatic dysfunction of lumbar region: Secondary | ICD-10-CM | POA: Diagnosis not present

## 2023-10-09 DIAGNOSIS — M9902 Segmental and somatic dysfunction of thoracic region: Secondary | ICD-10-CM | POA: Diagnosis not present

## 2023-10-09 DIAGNOSIS — M62830 Muscle spasm of back: Secondary | ICD-10-CM | POA: Diagnosis not present

## 2023-10-09 DIAGNOSIS — M9903 Segmental and somatic dysfunction of lumbar region: Secondary | ICD-10-CM | POA: Diagnosis not present

## 2023-10-09 DIAGNOSIS — M9901 Segmental and somatic dysfunction of cervical region: Secondary | ICD-10-CM | POA: Diagnosis not present

## 2023-10-13 DIAGNOSIS — M9902 Segmental and somatic dysfunction of thoracic region: Secondary | ICD-10-CM | POA: Diagnosis not present

## 2023-10-13 DIAGNOSIS — M62830 Muscle spasm of back: Secondary | ICD-10-CM | POA: Diagnosis not present

## 2023-10-13 DIAGNOSIS — M9901 Segmental and somatic dysfunction of cervical region: Secondary | ICD-10-CM | POA: Diagnosis not present

## 2023-10-13 DIAGNOSIS — M9903 Segmental and somatic dysfunction of lumbar region: Secondary | ICD-10-CM | POA: Diagnosis not present

## 2023-10-15 DIAGNOSIS — M9903 Segmental and somatic dysfunction of lumbar region: Secondary | ICD-10-CM | POA: Diagnosis not present

## 2023-10-15 DIAGNOSIS — M62830 Muscle spasm of back: Secondary | ICD-10-CM | POA: Diagnosis not present

## 2023-10-15 DIAGNOSIS — M9901 Segmental and somatic dysfunction of cervical region: Secondary | ICD-10-CM | POA: Diagnosis not present

## 2023-10-15 DIAGNOSIS — M9902 Segmental and somatic dysfunction of thoracic region: Secondary | ICD-10-CM | POA: Diagnosis not present

## 2023-10-16 DIAGNOSIS — M9901 Segmental and somatic dysfunction of cervical region: Secondary | ICD-10-CM | POA: Diagnosis not present

## 2023-10-16 DIAGNOSIS — M9903 Segmental and somatic dysfunction of lumbar region: Secondary | ICD-10-CM | POA: Diagnosis not present

## 2023-10-16 DIAGNOSIS — M9902 Segmental and somatic dysfunction of thoracic region: Secondary | ICD-10-CM | POA: Diagnosis not present

## 2023-10-16 DIAGNOSIS — M62830 Muscle spasm of back: Secondary | ICD-10-CM | POA: Diagnosis not present

## 2023-10-20 DIAGNOSIS — M9901 Segmental and somatic dysfunction of cervical region: Secondary | ICD-10-CM | POA: Diagnosis not present

## 2023-10-20 DIAGNOSIS — M62830 Muscle spasm of back: Secondary | ICD-10-CM | POA: Diagnosis not present

## 2023-10-20 DIAGNOSIS — M9902 Segmental and somatic dysfunction of thoracic region: Secondary | ICD-10-CM | POA: Diagnosis not present

## 2023-10-20 DIAGNOSIS — M9903 Segmental and somatic dysfunction of lumbar region: Secondary | ICD-10-CM | POA: Diagnosis not present

## 2023-10-22 DIAGNOSIS — M62830 Muscle spasm of back: Secondary | ICD-10-CM | POA: Diagnosis not present

## 2023-10-22 DIAGNOSIS — M9902 Segmental and somatic dysfunction of thoracic region: Secondary | ICD-10-CM | POA: Diagnosis not present

## 2023-10-22 DIAGNOSIS — M9901 Segmental and somatic dysfunction of cervical region: Secondary | ICD-10-CM | POA: Diagnosis not present

## 2023-10-22 DIAGNOSIS — M9903 Segmental and somatic dysfunction of lumbar region: Secondary | ICD-10-CM | POA: Diagnosis not present

## 2023-10-23 DIAGNOSIS — M62830 Muscle spasm of back: Secondary | ICD-10-CM | POA: Diagnosis not present

## 2023-10-23 DIAGNOSIS — M9902 Segmental and somatic dysfunction of thoracic region: Secondary | ICD-10-CM | POA: Diagnosis not present

## 2023-10-23 DIAGNOSIS — M9901 Segmental and somatic dysfunction of cervical region: Secondary | ICD-10-CM | POA: Diagnosis not present

## 2023-10-23 DIAGNOSIS — M9903 Segmental and somatic dysfunction of lumbar region: Secondary | ICD-10-CM | POA: Diagnosis not present

## 2023-10-27 DIAGNOSIS — M62830 Muscle spasm of back: Secondary | ICD-10-CM | POA: Diagnosis not present

## 2023-10-27 DIAGNOSIS — M9903 Segmental and somatic dysfunction of lumbar region: Secondary | ICD-10-CM | POA: Diagnosis not present

## 2023-10-27 DIAGNOSIS — M9901 Segmental and somatic dysfunction of cervical region: Secondary | ICD-10-CM | POA: Diagnosis not present

## 2023-10-27 DIAGNOSIS — M9902 Segmental and somatic dysfunction of thoracic region: Secondary | ICD-10-CM | POA: Diagnosis not present

## 2023-10-29 DIAGNOSIS — M9903 Segmental and somatic dysfunction of lumbar region: Secondary | ICD-10-CM | POA: Diagnosis not present

## 2023-10-29 DIAGNOSIS — M9902 Segmental and somatic dysfunction of thoracic region: Secondary | ICD-10-CM | POA: Diagnosis not present

## 2023-10-29 DIAGNOSIS — M62830 Muscle spasm of back: Secondary | ICD-10-CM | POA: Diagnosis not present

## 2023-10-29 DIAGNOSIS — M9901 Segmental and somatic dysfunction of cervical region: Secondary | ICD-10-CM | POA: Diagnosis not present

## 2023-10-30 DIAGNOSIS — M9902 Segmental and somatic dysfunction of thoracic region: Secondary | ICD-10-CM | POA: Diagnosis not present

## 2023-10-30 DIAGNOSIS — M9903 Segmental and somatic dysfunction of lumbar region: Secondary | ICD-10-CM | POA: Diagnosis not present

## 2023-10-30 DIAGNOSIS — M9901 Segmental and somatic dysfunction of cervical region: Secondary | ICD-10-CM | POA: Diagnosis not present

## 2023-10-30 DIAGNOSIS — M62830 Muscle spasm of back: Secondary | ICD-10-CM | POA: Diagnosis not present

## 2023-11-03 DIAGNOSIS — M9901 Segmental and somatic dysfunction of cervical region: Secondary | ICD-10-CM | POA: Diagnosis not present

## 2023-11-03 DIAGNOSIS — M62830 Muscle spasm of back: Secondary | ICD-10-CM | POA: Diagnosis not present

## 2023-11-03 DIAGNOSIS — M9903 Segmental and somatic dysfunction of lumbar region: Secondary | ICD-10-CM | POA: Diagnosis not present

## 2023-11-03 DIAGNOSIS — M9902 Segmental and somatic dysfunction of thoracic region: Secondary | ICD-10-CM | POA: Diagnosis not present

## 2023-11-03 NOTE — Telephone Encounter (Signed)
PT returning call to further discuss medications. Please advise.

## 2023-11-03 NOTE — Telephone Encounter (Signed)
Pt stated that she recently increased her diltiazem. Pt stated that it has helped some although she is still having problems swallowing. Pt questioned if Dr. Rhea Belton wants to proceed with her stretching her  esophagus or if she needs to just stay on the medication. Pt stated that if she is to stay on the medication then she will need a refill for the Diltiazem. Please review and advise

## 2023-11-03 NOTE — Telephone Encounter (Signed)
Would rec she stay on the higher dose of diltiazem and proceed with EGD Thanks JMP

## 2023-11-04 DIAGNOSIS — M9901 Segmental and somatic dysfunction of cervical region: Secondary | ICD-10-CM | POA: Diagnosis not present

## 2023-11-04 DIAGNOSIS — M62830 Muscle spasm of back: Secondary | ICD-10-CM | POA: Diagnosis not present

## 2023-11-04 DIAGNOSIS — M9903 Segmental and somatic dysfunction of lumbar region: Secondary | ICD-10-CM | POA: Diagnosis not present

## 2023-11-04 DIAGNOSIS — M9902 Segmental and somatic dysfunction of thoracic region: Secondary | ICD-10-CM | POA: Diagnosis not present

## 2023-11-04 NOTE — Telephone Encounter (Signed)
 Left message for pt to call back

## 2023-11-05 ENCOUNTER — Other Ambulatory Visit: Payer: Self-pay

## 2023-11-05 DIAGNOSIS — R1310 Dysphagia, unspecified: Secondary | ICD-10-CM

## 2023-11-05 DIAGNOSIS — K224 Dyskinesia of esophagus: Secondary | ICD-10-CM

## 2023-11-05 DIAGNOSIS — K219 Gastro-esophageal reflux disease without esophagitis: Secondary | ICD-10-CM

## 2023-11-05 MED ORDER — DILTIAZEM HCL ER 240 MG PO TB24: 240.0000 mg | ORAL_TABLET | Freq: Every day | ORAL | 0 refills | Status: DC

## 2023-11-05 NOTE — Telephone Encounter (Signed)
 Pt made aware of Dr. Albertus recommendations: Pt was scheduled for a previsit on 11/11/2023 at 4:00 PM. Pt made aware. Pt was scheduled for a EGD on 12/22/2023 at 1:30 with Dr. Albertus. Pt made aware. Refill sent in for the diltiazem . Pt made aware. Pt verbalized understanding with all questions answered.

## 2023-11-07 ENCOUNTER — Telehealth: Payer: Self-pay | Admitting: Internal Medicine

## 2023-11-07 NOTE — Telephone Encounter (Signed)
 Patient is returning your call stating she can do the pre-op appointment over the phone today after 3PM, patient states if not she will still be able to come in 11/11/2023 at 4:00PM.

## 2023-11-07 NOTE — Telephone Encounter (Signed)
 Discussed with pt that there are no appts for today. She will come 11/11/23 at 4pm for the previsit.

## 2023-11-10 DIAGNOSIS — M9902 Segmental and somatic dysfunction of thoracic region: Secondary | ICD-10-CM | POA: Diagnosis not present

## 2023-11-10 DIAGNOSIS — M9903 Segmental and somatic dysfunction of lumbar region: Secondary | ICD-10-CM | POA: Diagnosis not present

## 2023-11-10 DIAGNOSIS — M62830 Muscle spasm of back: Secondary | ICD-10-CM | POA: Diagnosis not present

## 2023-11-10 DIAGNOSIS — M9901 Segmental and somatic dysfunction of cervical region: Secondary | ICD-10-CM | POA: Diagnosis not present

## 2023-11-11 ENCOUNTER — Ambulatory Visit (AMBULATORY_SURGERY_CENTER): Payer: PPO

## 2023-11-11 DIAGNOSIS — K219 Gastro-esophageal reflux disease without esophagitis: Secondary | ICD-10-CM

## 2023-11-11 DIAGNOSIS — K224 Dyskinesia of esophagus: Secondary | ICD-10-CM

## 2023-11-11 DIAGNOSIS — R1310 Dysphagia, unspecified: Secondary | ICD-10-CM

## 2023-11-11 NOTE — Progress Notes (Signed)
No egg or soy allergy known to patient  No issues known to pt with past sedation with any surgeries or procedures Patient denies ever being told they had issues or difficulty with intubation  No FH of Malignant Hyperthermia Pt is not on diet pills Pt is not on  home 02  Pt is not on blood thinners  Pt denies issues with constipation  No A fib or A flutter Have any cardiac testing pending--no  LOA: independent    PV completed with patient. Prep instructions sent via mychart and hard copy given at [facility] apt.

## 2023-11-13 DIAGNOSIS — M9903 Segmental and somatic dysfunction of lumbar region: Secondary | ICD-10-CM | POA: Diagnosis not present

## 2023-11-13 DIAGNOSIS — M9901 Segmental and somatic dysfunction of cervical region: Secondary | ICD-10-CM | POA: Diagnosis not present

## 2023-11-13 DIAGNOSIS — M9902 Segmental and somatic dysfunction of thoracic region: Secondary | ICD-10-CM | POA: Diagnosis not present

## 2023-11-13 DIAGNOSIS — M62830 Muscle spasm of back: Secondary | ICD-10-CM | POA: Diagnosis not present

## 2023-11-24 DIAGNOSIS — M62830 Muscle spasm of back: Secondary | ICD-10-CM | POA: Diagnosis not present

## 2023-11-24 DIAGNOSIS — M9902 Segmental and somatic dysfunction of thoracic region: Secondary | ICD-10-CM | POA: Diagnosis not present

## 2023-11-24 DIAGNOSIS — M9903 Segmental and somatic dysfunction of lumbar region: Secondary | ICD-10-CM | POA: Diagnosis not present

## 2023-11-24 DIAGNOSIS — M9901 Segmental and somatic dysfunction of cervical region: Secondary | ICD-10-CM | POA: Diagnosis not present

## 2023-11-27 DIAGNOSIS — M9912 Subluxation complex (vertebral) of thoracic region: Secondary | ICD-10-CM | POA: Diagnosis not present

## 2023-11-27 DIAGNOSIS — M9911 Subluxation complex (vertebral) of cervical region: Secondary | ICD-10-CM | POA: Diagnosis not present

## 2023-11-27 DIAGNOSIS — M62830 Muscle spasm of back: Secondary | ICD-10-CM | POA: Diagnosis not present

## 2023-11-27 DIAGNOSIS — M9913 Subluxation complex (vertebral) of lumbar region: Secondary | ICD-10-CM | POA: Diagnosis not present

## 2023-12-01 DIAGNOSIS — M9913 Subluxation complex (vertebral) of lumbar region: Secondary | ICD-10-CM | POA: Diagnosis not present

## 2023-12-01 DIAGNOSIS — M62830 Muscle spasm of back: Secondary | ICD-10-CM | POA: Diagnosis not present

## 2023-12-01 DIAGNOSIS — M9912 Subluxation complex (vertebral) of thoracic region: Secondary | ICD-10-CM | POA: Diagnosis not present

## 2023-12-01 DIAGNOSIS — M9911 Subluxation complex (vertebral) of cervical region: Secondary | ICD-10-CM | POA: Diagnosis not present

## 2023-12-04 DIAGNOSIS — M9913 Subluxation complex (vertebral) of lumbar region: Secondary | ICD-10-CM | POA: Diagnosis not present

## 2023-12-04 DIAGNOSIS — M62830 Muscle spasm of back: Secondary | ICD-10-CM | POA: Diagnosis not present

## 2023-12-04 DIAGNOSIS — M9911 Subluxation complex (vertebral) of cervical region: Secondary | ICD-10-CM | POA: Diagnosis not present

## 2023-12-04 DIAGNOSIS — M9912 Subluxation complex (vertebral) of thoracic region: Secondary | ICD-10-CM | POA: Diagnosis not present

## 2023-12-08 DIAGNOSIS — N8110 Cystocele, unspecified: Secondary | ICD-10-CM | POA: Diagnosis not present

## 2023-12-08 DIAGNOSIS — Z01411 Encounter for gynecological examination (general) (routine) with abnormal findings: Secondary | ICD-10-CM | POA: Diagnosis not present

## 2023-12-08 DIAGNOSIS — N814 Uterovaginal prolapse, unspecified: Secondary | ICD-10-CM | POA: Diagnosis not present

## 2023-12-09 DIAGNOSIS — Z794 Long term (current) use of insulin: Secondary | ICD-10-CM | POA: Diagnosis not present

## 2023-12-09 DIAGNOSIS — I2510 Atherosclerotic heart disease of native coronary artery without angina pectoris: Secondary | ICD-10-CM | POA: Diagnosis not present

## 2023-12-09 DIAGNOSIS — E119 Type 2 diabetes mellitus without complications: Secondary | ICD-10-CM | POA: Diagnosis not present

## 2023-12-09 DIAGNOSIS — I10 Essential (primary) hypertension: Secondary | ICD-10-CM | POA: Diagnosis not present

## 2023-12-10 DIAGNOSIS — M9911 Subluxation complex (vertebral) of cervical region: Secondary | ICD-10-CM | POA: Diagnosis not present

## 2023-12-10 DIAGNOSIS — M62830 Muscle spasm of back: Secondary | ICD-10-CM | POA: Diagnosis not present

## 2023-12-10 DIAGNOSIS — M9912 Subluxation complex (vertebral) of thoracic region: Secondary | ICD-10-CM | POA: Diagnosis not present

## 2023-12-10 DIAGNOSIS — M9913 Subluxation complex (vertebral) of lumbar region: Secondary | ICD-10-CM | POA: Diagnosis not present

## 2023-12-16 ENCOUNTER — Encounter: Payer: Self-pay | Admitting: Internal Medicine

## 2023-12-17 DIAGNOSIS — M62830 Muscle spasm of back: Secondary | ICD-10-CM | POA: Diagnosis not present

## 2023-12-17 DIAGNOSIS — M9911 Subluxation complex (vertebral) of cervical region: Secondary | ICD-10-CM | POA: Diagnosis not present

## 2023-12-17 DIAGNOSIS — M9913 Subluxation complex (vertebral) of lumbar region: Secondary | ICD-10-CM | POA: Diagnosis not present

## 2023-12-17 DIAGNOSIS — M9912 Subluxation complex (vertebral) of thoracic region: Secondary | ICD-10-CM | POA: Diagnosis not present

## 2023-12-22 ENCOUNTER — Ambulatory Visit (AMBULATORY_SURGERY_CENTER): Payer: PPO | Admitting: Internal Medicine

## 2023-12-22 ENCOUNTER — Encounter: Payer: Self-pay | Admitting: Internal Medicine

## 2023-12-22 VITALS — BP 109/47 | HR 60 | Temp 98.0°F | Resp 10 | Ht 60.0 in | Wt 128.0 lb

## 2023-12-22 DIAGNOSIS — K224 Dyskinesia of esophagus: Secondary | ICD-10-CM

## 2023-12-22 DIAGNOSIS — R1310 Dysphagia, unspecified: Secondary | ICD-10-CM

## 2023-12-22 DIAGNOSIS — R1319 Other dysphagia: Secondary | ICD-10-CM

## 2023-12-22 DIAGNOSIS — I10 Essential (primary) hypertension: Secondary | ICD-10-CM | POA: Diagnosis not present

## 2023-12-22 DIAGNOSIS — F32A Depression, unspecified: Secondary | ICD-10-CM | POA: Diagnosis not present

## 2023-12-22 DIAGNOSIS — Q399 Congenital malformation of esophagus, unspecified: Secondary | ICD-10-CM | POA: Diagnosis not present

## 2023-12-22 DIAGNOSIS — F411 Generalized anxiety disorder: Secondary | ICD-10-CM | POA: Diagnosis not present

## 2023-12-22 DIAGNOSIS — E119 Type 2 diabetes mellitus without complications: Secondary | ICD-10-CM | POA: Diagnosis not present

## 2023-12-22 MED ORDER — SODIUM CHLORIDE 0.9 % IV SOLN: 500.0000 mL | Freq: Once | INTRAVENOUS | Status: DC

## 2023-12-22 NOTE — Progress Notes (Signed)
 GASTROENTEROLOGY PROCEDURE H&P NOTE   Primary Care Physician: Gaspar Garbe, MD    Reason for Procedure:  Dysphagia, hypercontractile esophagus, previous response to esophageal dilation, incomplete response to PPI and calcium channel blockade  Plan:    EGD dilation  Patient is appropriate for endoscopic procedure(s) in the ambulatory ([facility]) setting.  The nature of the procedure, as well as the risks, benefits, and alternatives were carefully and thoroughly reviewed with the patient. Ample time for discussion and questions allowed. The patient understood, was satisfied, and agreed to proceed.     HPI: Adrienne [name] is a 73 y.o. female who presents for EGD with dilation.  Medical history as below. No recent chest pain or shortness of breath.  No abdominal pain today.  Past Medical History:  Diagnosis Date   Arthritis    hands/fingers   Carpal tunnel syndrome    Depression    Diabetes (HCC)    Eczema    Esophageal reflux    Esophageal stricture    Generalized anxiety disorder    GERD (gastroesophageal reflux disease)    Hiatal hernia    Hyperlipidemia    Hypertension    states under control with med., has been on med. x 30 yr.   Insulin dependent diabetes mellitus    Irritable bowel syndrome    Nutcracker esophagus    Trigger finger of right hand 09/2017   index and middle fingers   Wears hearing aid in both ears     Past Surgical History:  Procedure Laterality Date   CARPAL TUNNEL RELEASE Left 01/07/2014   Procedure: LEFT CARPAL TUNNEL RELEASE;  Surgeon: Tami Ribas, MD;  Location: [facility];  Service: Orthopedics;  Laterality: Left;   CARPAL TUNNEL RELEASE Right 03/06/2023   Procedure: RIGHT CARPAL TUNNEL RELEASE;  Surgeon: Betha Loa, MD;  Location: [facility];  Service: Orthopedics;  Laterality: Right;   COLONOSCOPY     ESOPHAGEAL MANOMETRY  05/28/2006   ESOPHAGEAL MANOMETRY N/A 11/13/2022   Procedure: ESOPHAGEAL  MANOMETRY (EM);  Surgeon: Beverley Fiedler, MD;  Location: [facility];  Service: Gastroenterology;  Laterality: N/A;   EYE SURGERY Right    tear duct repair   NASAL SINUS SURGERY     TONSILLECTOMY     TRIGGER FINGER RELEASE Right 11/06/2017   Procedure: RELEASE TRIGGER FINGER/A-1 PULLEY RIGHT INDEX FINGER AND RIGHT MIDDLE FINGER;  Surgeon: Betha Loa, MD;  Location: [facility];  Service: Orthopedics;  Laterality: Right;   UPPER GASTROINTESTINAL ENDOSCOPY  10/18/2013   with dilation; with Propofol   UPPER GASTROINTESTINAL ENDOSCOPY  10/30/2016    Prior to Admission medications   Medication Sig Start Date End Date Taking? Authorizing Provider  ALPRAZolam (XANAX) 0.25 MG tablet TAKE 1 TABLET BY MOUTH ONCE DAILY AS NEEDED FOR ANXIETY OR SLEEP 06/05/18  Yes [provider]  celecoxib (CELEBREX) 200 MG capsule Take 200 mg by mouth daily.   Yes [provider]  Cetirizine HCl 10 MG CAPS Take 10 mg by mouth as needed.   Yes [provider]  diltiazem (CARDIZEM LA) 240 MG 24 hr tablet Take 1 tablet (240 mg total) by mouth daily. 11/05/23  Yes Mclean Moya, Carie Caddy, MD  empagliflozin (JARDIANCE) 25 MG TABS tablet Take 25 mg by mouth daily.   Yes [provider]  Ferrous Sulfate (IRON SLOW RELEASE) 140 (45 Fe) MG TBCR Take 1 tablet by mouth daily.   Yes [provider]  furosemide (LASIX) 20  MG tablet furosemide 20 mg tablet   Yes [provider]  insulin glargine (LANTUS) 100 UNIT/ML injection Inject 17 Units into the skin daily.   Yes [provider]  isosorbide mononitrate (IMDUR) 30 MG 24 hr tablet Take 15 mg by mouth daily.   Yes [provider]  MAGNESIUM PO Take 450 mg by mouth daily.   Yes [provider]  metFORMIN (GLUCOPHAGE) 1000 MG tablet Take 1,000 mg by mouth 2 (two) times daily with a meal.    Yes [provider]  Multiple Vitamin (MULTIVITAMIN) tablet Take 1 tablet by mouth daily.   Yes  [provider]  pantoprazole (PROTONIX) 40 MG tablet Take 1 tablet (40 mg total) by mouth 2 (two) times daily before a meal. 04/30/23  Yes Alizabeth Antonio, Carie Caddy, MD  rosuvastatin (CRESTOR) 10 MG tablet Take 10 mg by mouth daily. 04/16/23  Yes [provider]  venlafaxine XR (EFFEXOR-XR) 37.5 MG 24 hr capsule Take 1 capsule by mouth. DAILY 05/26/22  Yes [provider]  aspirin 81 MG tablet Take 81 mg by mouth daily.    [provider]  calcium carbonate (OS-CAL) 600 MG TABS tablet Take 500 mg by mouth 2 (two) times daily with a meal.    [provider]  Continuous Glucose Sensor (FREESTYLE LIBRE 3 SENSOR) MISC Inject into the skin daily in the afternoon. 02/06/23   [provider]  Glucagon (GVOKE HYPOPEN 2-PACK) 1 MG/0.2ML SOAJ as directed Subcutaneous for severe hypoglycemia    [provider]  levocetirizine (XYZAL) 5 MG tablet Take 10 mg by mouth daily.    [provider]  ramipril (ALTACE) 10 MG tablet Take 5 mg by mouth daily.    [provider]  traMADol (ULTRAM) 50 MG tablet Take 50 mg by mouth every 6 (six) hours as needed. 05/20/22   [provider]  TRULICITY 4.5 MG/0.5ML SOPN Inject 4.5 mg into the skin once a week. Tuesday    [provider]  FLUoxetine (PROZAC) 40 MG capsule Take 40 mg by mouth daily.  12/05/11  [provider]  rosiglitazone-metformin (AVANDAMET) 12-998 MG per tablet Take 1 tablet by mouth 2 (two) times daily with a meal.  12/05/11  [provider]    Current Outpatient Medications  Medication Sig Dispense Refill   ALPRAZolam (XANAX) 0.25 MG tablet TAKE 1 TABLET BY MOUTH ONCE DAILY AS NEEDED FOR ANXIETY OR SLEEP  0   celecoxib (CELEBREX) 200 MG capsule Take 200 mg by mouth daily.     Cetirizine HCl 10 MG CAPS Take 10 mg by mouth as needed.     diltiazem (CARDIZEM LA) 240 MG 24 hr tablet Take 1 tablet (240 mg total) by mouth daily. 60 tablet 0   empagliflozin  (JARDIANCE) 25 MG TABS tablet Take 25 mg by mouth daily.     Ferrous Sulfate (IRON SLOW RELEASE) 140 (45 Fe) MG TBCR Take 1 tablet by mouth daily.     furosemide (LASIX) 20 MG tablet furosemide 20 mg tablet     insulin glargine (LANTUS) 100 UNIT/ML injection Inject 17 Units into the skin daily.     isosorbide mononitrate (IMDUR) 30 MG 24 hr tablet Take 15 mg by mouth daily.     MAGNESIUM PO Take 450 mg by mouth daily.     metFORMIN (GLUCOPHAGE) 1000 MG tablet Take 1,000 mg by mouth 2 (two) times daily with a meal.      Multiple Vitamin (MULTIVITAMIN) tablet Take 1  tablet by mouth daily.     pantoprazole (PROTONIX) 40 MG tablet Take 1 tablet (40 mg total) by mouth 2 (two) times daily before a meal. 60 tablet 11   rosuvastatin (CRESTOR) 10 MG tablet Take 10 mg by mouth daily.     venlafaxine XR (EFFEXOR-XR) 37.5 MG 24 hr capsule Take 1 capsule by mouth. DAILY     aspirin 81 MG tablet Take 81 mg by mouth daily.     calcium carbonate (OS-CAL) 600 MG TABS tablet Take 500 mg by mouth 2 (two) times daily with a meal.     Continuous Glucose Sensor (FREESTYLE LIBRE 3 SENSOR) MISC Inject into the skin daily in the afternoon.     Glucagon (GVOKE HYPOPEN 2-PACK) 1 MG/0.2ML SOAJ as directed Subcutaneous for severe hypoglycemia     levocetirizine (XYZAL) 5 MG tablet Take 10 mg by mouth daily.     ramipril (ALTACE) 10 MG tablet Take 5 mg by mouth daily.     traMADol (ULTRAM) 50 MG tablet Take 50 mg by mouth every 6 (six) hours as needed.     TRULICITY 4.5 MG/0.5ML SOPN Inject 4.5 mg into the skin once a week. Tuesday     Current Facility-Administered Medications  Medication Dose Route Frequency Provider Last Rate Last Admin   0.9 %  sodium chloride infusion  500 mL Intravenous Once Gustava Berland, Carie Caddy, MD       dexamethasone (DECADRON) injection 2 mg  2 mg Other Once Freddie Breech, DPM        Allergies as of 12/22/2023 - Review Complete 12/22/2023  Allergen Reaction Noted   Molds & smuts Nausea And  Vomiting 09/11/2023   Exenatide Other (See Comments) 07/02/2017   Sulfa antibiotics Rash 10/30/2017    Family History  Problem Relation Age of Onset   Peripheral vascular disease Mother 68   Anesthesia problems Mother        post-op N/V; hard to wake up post-op   Diabetes Father    CAD Father 43   AAA (abdominal aortic aneurysm) Father    Diabetes Sister    Stroke Sister    AAA (abdominal aortic aneurysm) Brother    Colon cancer Maternal Grandmother    Stomach cancer Maternal Grandfather    Prostate cancer Paternal Grandfather    Inflammatory bowel disease Grandchild    Esophageal cancer Neg Hx    Rectal cancer Neg Hx     Social History   Socioeconomic History   Marital status: Married    Spouse name: Not on file   Number of children: 1   Years of education: Not on file   Highest education level: Not on file  Occupational History   Occupation: CUSTOMER SERV    Employer: [employer]  Tobacco Use   Smoking status: Never   Smokeless tobacco: Never  Vaping Use   Vaping status: Never Used  Substance and Sexual Activity   Alcohol use: Yes    Alcohol/week: 0.0 standard drinks of alcohol    Comment: occasionally   Drug use: No   Sexual activity: Not on file  Other Topics Concern   Not on file  Social History Narrative   Lives with husband.   Social Drivers of Corporate investment banker Strain: Not on file  Food Insecurity: Not on file  Transportation Needs: Not on file  Physical Activity: Not on file  Stress: Not on file  Social Connections: Not on file  Intimate Partner Violence: Not on file  Physical Exam: Vital signs in last 24 hours: @BP  (!) 124/56   Pulse 71   Temp 98 F (36.7 C)   Ht 5' (1.524 m)   Wt 128 lb (58.1 kg)   SpO2 97%   BMI 25.00 kg/m  GEN: NAD EYE: Sclerae anicteric ENT: MMM CV: Non-tachycardic Pulm: CTA b/l GI: Soft, NT/ND NEURO:  Alert & Oriented x 3   Erick Blinks, MD [facility]  12/22/2023 2:07 PM

## 2023-12-22 NOTE — Progress Notes (Signed)
 Called to room to assist during endoscopic procedure.  Patient ID and intended procedure confirmed with present staff. Received instructions for my participation in the procedure from the performing physician.

## 2023-12-22 NOTE — Progress Notes (Signed)
 Pt's states no medical or surgical changes since previsit or office visit.

## 2023-12-22 NOTE — Op Note (Signed)
 [facility] Patient Name: Adrienne [name] Procedure Date: 12/22/2023 2:04 PM MRN: [id number] Endoscopist: Beverley Fiedler , MD, [id number] Age: 73 Referring MD:  Date of Birth: 10-14-1950 Gender: Female Account #: [id number] Procedure:                Upper GI endoscopy Indications:              Dysphagia; Responsive to previous dilation, known                            hypercontractile esophagus with some improvement                            with diltiazem and twice daily PPI but persistent                            solid food dysphagia symptom; Dilation performed in                            2015, 2018 and last in July 2022 (52 Jerene Dilling                            at that time) Medicines:                Monitored Anesthesia Care Procedure:                Pre-Anesthesia Assessment:                           - Prior to the procedure, a History and Physical                            was performed, and patient medications and                            allergies were reviewed. The patient's tolerance of                            previous anesthesia was also reviewed. The risks                            and benefits of the procedure and the sedation                            options and risks were discussed with the patient.                            All questions were answered, and informed consent                            was obtained. Prior Anticoagulants: The patient has                            taken no anticoagulant or antiplatelet agents. ASA  Grade Assessment: II - A patient with mild systemic                            disease. After reviewing the risks and benefits,                            the patient was deemed in satisfactory condition to                            undergo the procedure.                           After obtaining informed consent, the endoscope was                            passed under direct vision.  Throughout the                            procedure, the patient's blood pressure, pulse, and                            oxygen saturations were monitored continuously. The                            Olympus Scope SN [id number] was introduced through the                            mouth, and advanced to the second part of duodenum.                            The upper GI endoscopy was accomplished without                            difficulty. The patient tolerated the procedure                            well. Scope In: Scope Out: Findings:                 The lower third of the esophagus was mildly                            tortuous.                           The exam of the esophagus was otherwise normal.                           No endoscopic abnormality was evident in the                            esophagus to explain the patient's complaint of                            dysphagia. It was decided, however, to proceed  with                            dilation of the entire esophagus. The scope was                            withdrawn. Dilation was performed with a Maloney                            dilator with mild resistance at 52 Fr and 54 Fr.                           The cardia and gastric fundus were normal on                            retroflexion.                           The entire examined stomach was normal.                           The examined duodenum was normal. Complications:            No immediate complications. Estimated Blood Loss:     Estimated blood loss: none. Impression:               - Tortuous esophagus.                           - No endoscopic esophageal abnormality to explain                            patient's dysphagia. Esophagus dilated with 52 and                            54 Fr Maloney.                           - Normal stomach.                           - Normal examined duodenum.                           - No specimens  collected. Recommendation:           - Patient has a contact number available for                            emergencies. The signs and symptoms of potential                            delayed complications were discussed with the                            patient. Return to normal activities tomorrow.  Written discharge instructions were provided to the                            patient.                           - Advance diet as tolerated.                           - Continue present medications including PPI and                            diltiazem 240 mg daily (long-acting).                           - If no benefit in dysphagia with dilation, query                            if BOTOX by ENT to the UES would be helpful (UES                            pressure slightly elevated at manometry). If no,                            then possible 5-PDE trial. Beverley Fiedler, MD 12/22/2023 2:30:25 PM This report has been signed electronically.

## 2023-12-22 NOTE — Patient Instructions (Addendum)
 Please read handouts provided. Continue present medications. Advance diet as tolerated. Continue Protonix and Diltiazem 240 mg daily.   YOU HAD AN ENDOSCOPIC PROCEDURE TODAY AT THE [facility]:   Refer to the procedure report that was given to you for any specific questions about what was found during the examination.  If the procedure report does not answer your questions, please call your gastroenterologist to clarify.  If you requested that your care partner not be given the details of your procedure findings, then the procedure report has been included in a sealed envelope for you to review at your convenience later.  YOU SHOULD EXPECT: Some feelings of bloating in the abdomen. Passage of more gas than usual.  Walking can help get rid of the air that was put into your GI tract during the procedure and reduce the bloating. If you had a lower endoscopy (such as a colonoscopy or flexible sigmoidoscopy) you may notice spotting of blood in your stool or on the toilet paper. If you underwent a bowel prep for your procedure, you may not have a normal bowel movement for a few days.  Please Note:  You might notice some irritation and congestion in your nose or some drainage.  This is from the oxygen used during your procedure.  There is no need for concern and it should clear up in a day or so.  SYMPTOMS TO REPORT IMMEDIATELY:   Following upper endoscopy (EGD)  Vomiting of blood or coffee ground material  New chest pain or pain under the shoulder blades  Painful or persistently difficult swallowing  New shortness of breath  Fever of 100F or higher  Black, tarry-looking stools  For urgent or emergent issues, a gastroenterologist can be reached at any hour by calling [phone]. Do not use MyChart messaging for urgent concerns.    DIET:  We do recommend a small meal at first, but then you may proceed to your regular diet.  Drink plenty of fluids but you should avoid alcoholic  beverages for 24 hours.  ACTIVITY:  You should plan to take it easy for the rest of today and you should NOT DRIVE or use heavy machinery until tomorrow (because of the sedation medicines used during the test).    FOLLOW UP: Our staff will call the number listed on your records the next business day following your procedure.  We will call around 7:15- 8:00 am to check on you and address any questions or concerns that you may have regarding the information given to you following your procedure. If we do not reach you, we will leave a message.     If any biopsies were taken you will be contacted by phone or by letter within the next 1-3 weeks.  Please call us at [phone] if you have not heard about the biopsies in 3 weeks.    SIGNATURES/CONFIDENTIALITY: You and/or your care partner have signed paperwork which will be entered into your electronic medical record.  These signatures attest to the fact that that the information above on your After Visit Summary has been reviewed and is understood.  Full responsibility of the confidentiality of this discharge information lies with you and/or your care-partner.

## 2023-12-22 NOTE — Progress Notes (Signed)
 Report to PACU, RN, vss, BBS= Clear.

## 2023-12-23 ENCOUNTER — Telehealth: Payer: Self-pay

## 2023-12-23 DIAGNOSIS — I517 Cardiomegaly: Secondary | ICD-10-CM | POA: Diagnosis not present

## 2023-12-23 DIAGNOSIS — E7800 Pure hypercholesterolemia, unspecified: Secondary | ICD-10-CM | POA: Diagnosis not present

## 2023-12-23 DIAGNOSIS — M8580 Other specified disorders of bone density and structure, unspecified site: Secondary | ICD-10-CM | POA: Diagnosis not present

## 2023-12-23 DIAGNOSIS — I119 Hypertensive heart disease without heart failure: Secondary | ICD-10-CM | POA: Diagnosis not present

## 2023-12-23 DIAGNOSIS — E119 Type 2 diabetes mellitus without complications: Secondary | ICD-10-CM | POA: Diagnosis not present

## 2023-12-23 NOTE — Telephone Encounter (Signed)
  Follow up Call-     12/22/2023    1:30 PM 04/03/2021    1:37 PM  Call back number  Post procedure Call Back phone  # [phone]  Permission to leave phone message Yes Yes     Patient questions:  Do you have a fever, pain , or abdominal swelling? No. Pain Score  0 *  Have you tolerated food without any problems? Yes.    Have you been able to return to your normal activities? Yes.    Do you have any questions about your discharge instructions: Diet   No. Medications  No. Follow up visit  No.  Do you have questions or concerns about your Care? No.  Actions: * If pain score is 4 or above: No action needed, pain <4.

## 2023-12-25 DIAGNOSIS — M62830 Muscle spasm of back: Secondary | ICD-10-CM | POA: Diagnosis not present

## 2023-12-25 DIAGNOSIS — M9913 Subluxation complex (vertebral) of lumbar region: Secondary | ICD-10-CM | POA: Diagnosis not present

## 2023-12-25 DIAGNOSIS — M9912 Subluxation complex (vertebral) of thoracic region: Secondary | ICD-10-CM | POA: Diagnosis not present

## 2023-12-25 DIAGNOSIS — M9911 Subluxation complex (vertebral) of cervical region: Secondary | ICD-10-CM | POA: Diagnosis not present

## 2023-12-28 DIAGNOSIS — Z1212 Encounter for screening for malignant neoplasm of rectum: Secondary | ICD-10-CM | POA: Diagnosis not present

## 2023-12-29 DIAGNOSIS — R32 Unspecified urinary incontinence: Secondary | ICD-10-CM | POA: Diagnosis not present

## 2023-12-29 DIAGNOSIS — Z960 Presence of urogenital implants: Secondary | ICD-10-CM | POA: Diagnosis not present

## 2023-12-30 DIAGNOSIS — Z794 Long term (current) use of insulin: Secondary | ICD-10-CM | POA: Diagnosis not present

## 2023-12-30 DIAGNOSIS — Z1331 Encounter for screening for depression: Secondary | ICD-10-CM | POA: Diagnosis not present

## 2023-12-30 DIAGNOSIS — E663 Overweight: Secondary | ICD-10-CM | POA: Diagnosis not present

## 2023-12-30 DIAGNOSIS — Z1339 Encounter for screening examination for other mental health and behavioral disorders: Secondary | ICD-10-CM | POA: Diagnosis not present

## 2023-12-30 DIAGNOSIS — I209 Angina pectoris, unspecified: Secondary | ICD-10-CM | POA: Diagnosis not present

## 2023-12-30 DIAGNOSIS — I2510 Atherosclerotic heart disease of native coronary artery without angina pectoris: Secondary | ICD-10-CM | POA: Diagnosis not present

## 2023-12-30 DIAGNOSIS — F3130 Bipolar disorder, current episode depressed, mild or moderate severity, unspecified: Secondary | ICD-10-CM | POA: Diagnosis not present

## 2023-12-30 DIAGNOSIS — E7800 Pure hypercholesterolemia, unspecified: Secondary | ICD-10-CM | POA: Diagnosis not present

## 2023-12-30 DIAGNOSIS — J302 Other seasonal allergic rhinitis: Secondary | ICD-10-CM | POA: Diagnosis not present

## 2023-12-30 DIAGNOSIS — E119 Type 2 diabetes mellitus without complications: Secondary | ICD-10-CM | POA: Diagnosis not present

## 2023-12-30 DIAGNOSIS — R82998 Other abnormal findings in urine: Secondary | ICD-10-CM | POA: Diagnosis not present

## 2023-12-30 DIAGNOSIS — I10 Essential (primary) hypertension: Secondary | ICD-10-CM | POA: Diagnosis not present

## 2023-12-30 DIAGNOSIS — Z0000 Encounter for general adult medical examination without abnormal findings: Secondary | ICD-10-CM | POA: Diagnosis not present

## 2023-12-30 DIAGNOSIS — D692 Other nonthrombocytopenic purpura: Secondary | ICD-10-CM | POA: Diagnosis not present

## 2023-12-30 DIAGNOSIS — I2584 Coronary atherosclerosis due to calcified coronary lesion: Secondary | ICD-10-CM | POA: Diagnosis not present

## 2023-12-30 DIAGNOSIS — F418 Other specified anxiety disorders: Secondary | ICD-10-CM | POA: Diagnosis not present

## 2023-12-30 DIAGNOSIS — I119 Hypertensive heart disease without heart failure: Secondary | ICD-10-CM | POA: Diagnosis not present

## 2023-12-30 DIAGNOSIS — I700 Atherosclerosis of aorta: Secondary | ICD-10-CM | POA: Diagnosis not present

## 2024-01-01 DIAGNOSIS — M9912 Subluxation complex (vertebral) of thoracic region: Secondary | ICD-10-CM | POA: Diagnosis not present

## 2024-01-01 DIAGNOSIS — M9911 Subluxation complex (vertebral) of cervical region: Secondary | ICD-10-CM | POA: Diagnosis not present

## 2024-01-01 DIAGNOSIS — M9913 Subluxation complex (vertebral) of lumbar region: Secondary | ICD-10-CM | POA: Diagnosis not present

## 2024-01-01 DIAGNOSIS — M62830 Muscle spasm of back: Secondary | ICD-10-CM | POA: Diagnosis not present

## 2024-01-04 ENCOUNTER — Other Ambulatory Visit: Payer: Self-pay | Admitting: Internal Medicine

## 2024-01-04 DIAGNOSIS — K224 Dyskinesia of esophagus: Secondary | ICD-10-CM

## 2024-01-04 DIAGNOSIS — K219 Gastro-esophageal reflux disease without esophagitis: Secondary | ICD-10-CM

## 2024-01-04 DIAGNOSIS — R1310 Dysphagia, unspecified: Secondary | ICD-10-CM

## 2024-01-08 DIAGNOSIS — M9912 Subluxation complex (vertebral) of thoracic region: Secondary | ICD-10-CM | POA: Diagnosis not present

## 2024-01-08 DIAGNOSIS — M9911 Subluxation complex (vertebral) of cervical region: Secondary | ICD-10-CM | POA: Diagnosis not present

## 2024-01-08 DIAGNOSIS — M9913 Subluxation complex (vertebral) of lumbar region: Secondary | ICD-10-CM | POA: Diagnosis not present

## 2024-01-08 DIAGNOSIS — M62830 Muscle spasm of back: Secondary | ICD-10-CM | POA: Diagnosis not present

## 2024-02-02 ENCOUNTER — Other Ambulatory Visit: Payer: Self-pay

## 2024-02-02 ENCOUNTER — Encounter: Payer: Self-pay | Admitting: Internal Medicine

## 2024-02-02 ENCOUNTER — Ambulatory Visit: Admitting: Internal Medicine

## 2024-02-02 VITALS — BP 118/64 | HR 76 | Temp 98.1°F | Ht 60.0 in | Wt 127.1 lb

## 2024-02-02 DIAGNOSIS — R1319 Other dysphagia: Secondary | ICD-10-CM

## 2024-02-02 DIAGNOSIS — K219 Gastro-esophageal reflux disease without esophagitis: Secondary | ICD-10-CM

## 2024-02-02 DIAGNOSIS — J310 Chronic rhinitis: Secondary | ICD-10-CM

## 2024-02-02 DIAGNOSIS — L308 Other specified dermatitis: Secondary | ICD-10-CM

## 2024-02-02 DIAGNOSIS — L309 Dermatitis, unspecified: Secondary | ICD-10-CM | POA: Insufficient documentation

## 2024-02-02 DIAGNOSIS — Z9889 Other specified postprocedural states: Secondary | ICD-10-CM | POA: Diagnosis not present

## 2024-02-02 DIAGNOSIS — K9049 Malabsorption due to intolerance, not elsewhere classified: Secondary | ICD-10-CM | POA: Insufficient documentation

## 2024-02-02 DIAGNOSIS — Z882 Allergy status to sulfonamides status: Secondary | ICD-10-CM | POA: Diagnosis not present

## 2024-02-02 DIAGNOSIS — I10 Essential (primary) hypertension: Secondary | ICD-10-CM | POA: Diagnosis not present

## 2024-02-02 MED ORDER — FLUTICASONE PROPIONATE 50 MCG/ACT NA SUSP: 1.0000 | Freq: Two times a day (BID) | NASAL | 5 refills | Status: AC

## 2024-02-02 MED ORDER — AZELASTINE HCL 0.1 % NA SOLN: 2.0000 | Freq: Two times a day (BID) | NASAL | 5 refills | Status: AC | PRN

## 2024-02-02 MED ORDER — LEVOCETIRIZINE DIHYDROCHLORIDE 5 MG PO TABS: 5.0000 mg | ORAL_TABLET | Freq: Every evening | ORAL | 5 refills | Status: DC

## 2024-02-02 NOTE — Patient Instructions (Signed)
 Chronic Rhinitis Chronic allergic rhinitis with persistent symptoms despite current management. Considering allergy immunotherapy re-initiation. - Schedule allergy testing for next Monday at 1:30 PM. - Instruct to stop antihistamines for three days before testing. - Start nasal saline spray - use prior to medicated nasal sprays - Continue Flonase. 1 spray in each nostril daily - Add azelasteine nasal spray 1-2 sprays up to 2 times daily as needed.  - Continue Xyzal 5 mg daily as needed.   Esophageal Dysphagia Chronic esophageal dysphagia managed with esophageal relaxation medication. No documented eosinophilic esophagitis. - Follow up with GI specialist for ongoing management.  Hypertension Hypertension well-controlled with medication. Noted importance of notifying us  of use of beta-blocker if immuontherapy is to be considered. - Ensure list of current medications is available for future visits.  Dairy intolerance:  Feels thick coating on throat if consuming a lot of dairy - reduce dairy intake.      Follow up : next Monday at 1:30 PM- stop Xyzal and azelastine nasal spray 3 days prior to visit. It was a pleasure meeting you in clinic today! Thank you for allowing me to participate in your care.  Jonathon Neighbors, MD [facility]

## 2024-02-02 NOTE — Progress Notes (Signed)
 NEW PATIENT Date of Service/Encounter:  02/02/24 Referring provider: Tisovec, Richard W, MD Primary care provider: Tisovec, Richard W, MD  Subjective:  Adrienne [name] is a 73 y.o. female with a PMHx of hypercholesterolemia, hyper tension, type 2 diabetes, esophageal dysphagia presenting today for evaluation of chronic rhinitis. History obtained from: chart review and patient.   Discussed the use of AI scribe software for clinical note transcription with the patient, who gave verbal consent to proceed.  History of Present Illness   Adrienne [name] is a 72 year old female who presents with worsening allergy symptoms.  She has a history of allergies, previously managed with allergy shots many years ago. Her symptoms were particularly severe from Thanksgiving through Christmas, with improvement by January or February. She was found to be allergic to long needle pine trees, which were prevalent around her home and used as Christmas trees, contributing to her symptoms.  Currently, she experiences symptoms primarily in her ears and face, with drainage down her throat described as 'white, creamy, thick'. Her nose drips frequently, and her ears feel 'stomped up' at times, despite recent checks of her hearing aids. She reports a sensation of pressure in her ears, particularly in the mornings, affecting her hearing.  She has a history of major sinus surgery 7 to 10 years ago due to completely blocked nasal passages from overuse of over-the-counter sprays. Post-surgery, she experienced significant improvement. An ENT doctor confirmed 1.5 years ago that her surgery results were still good. Her symptoms are now present year-round.  Her current medications for allergies include Nasacort or Flonase, and Xyzal, which she uses daily. She also has a history of eczema, though not in recent years, and no history of asthma. She suspects a milk intolerance, as dairy products cause a 'thick coating' in her throat  and mouth, but she has no known food or medication allergies requiring an EpiPen. She avoids sulfa drugs due to a rash reaction.  She has type 2 diabetes, esophageal and swallowing issues, and a history of esophageal dilation a few months ago. She takes a medication to relax the lower esophagus and reports difficulty eating. She is on a reflux medication, esomeprazole 40 mg twice daily, and follows with a GI doctor. She also has high cholesterol and high blood pressure, both under control, and takes a 5 mg blood pressure medication.      Chart Review:  Reviewed PCP notes from referral 01/19/24: allergic rhinitis, previously on AIT, taking nasacort and xyzal.   Other allergy screening: Asthma: no Hymenoptera allergy: no History of recurrent infections suggestive of immunodeficency: no Vaccinations are up to date.   Past Medical History: Past Medical History:  Diagnosis Date   Arthritis    hands/fingers   Carpal tunnel syndrome    Depression    Diabetes (HCC)    Eczema    Esophageal reflux    Esophageal stricture    Generalized anxiety disorder    GERD (gastroesophageal reflux disease)    Hiatal hernia    Hyperlipidemia    Hypertension    states under control with med., has been on med. x 30 yr.   Insulin dependent diabetes mellitus    Irritable bowel syndrome    Nutcracker esophagus    Trigger finger of right hand 09/2017   index and middle fingers   Wears hearing aid in both ears    Medication List:  Current Outpatient Medications  Medication Sig Dispense Refill   ALPRAZolam (XANAX) 0.25 MG tablet  TAKE 1 TABLET BY MOUTH ONCE DAILY AS NEEDED FOR ANXIETY OR SLEEP  0   aspirin 81 MG tablet Take 81 mg by mouth daily.     azelastine (ASTELIN) 0.1 % nasal spray Place 2 sprays into both nostrils 2 (two) times daily as needed for rhinitis. Use in each nostril as directed 30 mL 5   calcium carbonate (OS-CAL) 600 MG TABS tablet Take 500 mg by mouth 2 (two) times daily with a meal.      celecoxib (CELEBREX) 200 MG capsule Take 200 mg by mouth daily.     Continuous Glucose Sensor (FREESTYLE LIBRE 3 SENSOR) MISC Inject into the skin daily in the afternoon.     diltiazem  (CARDIZEM  LA) 240 MG 24 hr tablet TAKE 1  BY MOUTH ONCE DAILY 60 tablet 0   empagliflozin (JARDIANCE) 25 MG TABS tablet Take 25 mg by mouth daily.     Ferrous Sulfate (IRON SLOW RELEASE) 140 (45 Fe) MG TBCR Take 1 tablet by mouth daily.     fluticasone (FLONASE) 50 MCG/ACT nasal spray Place 1 spray into both nostrils in the morning and at bedtime. 16 g 5   furosemide (LASIX) 20 MG tablet furosemide 20 mg tablet     Glucagon (GVOKE HYPOPEN 2-PACK) 1 MG/0.2ML SOAJ as directed Subcutaneous for severe hypoglycemia     insulin glargine (LANTUS) 100 UNIT/ML injection Inject 17 Units into the skin daily.     isosorbide mononitrate (IMDUR) 30 MG 24 hr tablet Take 15 mg by mouth daily.     MAGNESIUM PO Take 450 mg by mouth daily.     metFORMIN (GLUCOPHAGE) 1000 MG tablet Take 1,000 mg by mouth 2 (two) times daily with a meal.      Multiple Vitamin (MULTIVITAMIN) tablet Take 1 tablet by mouth daily.     pantoprazole  (PROTONIX ) 40 MG tablet Take 1 tablet (40 mg total) by mouth 2 (two) times daily before a meal. 60 tablet 11   ramipril (ALTACE) 10 MG tablet Take 5 mg by mouth daily.     rosuvastatin (CRESTOR) 10 MG tablet Take 10 mg by mouth daily.     traMADol (ULTRAM) 50 MG tablet Take 50 mg by mouth every 6 (six) hours as needed.     TRULICITY 4.5 MG/0.5ML SOPN Inject 4.5 mg into the skin once a week. Tuesday     venlafaxine XR (EFFEXOR-XR) 37.5 MG 24 hr capsule Take 1 capsule by mouth. DAILY     Cetirizine HCl 10 MG CAPS Take 10 mg by mouth as needed. (Patient not taking: Reported on 02/02/2024)     levocetirizine (XYZAL) 5 MG tablet Take 1 tablet (5 mg total) by mouth every evening. 32 tablet 5   Current Facility-Administered Medications  Medication Dose Route Frequency Provider Last Rate Last Admin   dexamethasone   (DECADRON ) injection 2 mg  2 mg Other Once [place], Jennifer L, DPM       Known Allergies:  Allergies  Allergen Reactions   Molds & Smuts Nausea And Vomiting   Exenatide Other (See Comments)    Other Reaction(s): Injection pain   Sulfa Antibiotics Rash   Past Surgical History: Past Surgical History:  Procedure Laterality Date   ADENOIDECTOMY     CARPAL TUNNEL RELEASE Left 01/07/2014   Procedure: LEFT CARPAL TUNNEL RELEASE;  Surgeon: Milagros Alf, MD;  Location: [facility];  Service: Orthopedics;  Laterality: Left;   CARPAL TUNNEL RELEASE Right 03/06/2023   Procedure: RIGHT CARPAL TUNNEL RELEASE;  Surgeon:  Brunilda Capra, MD;  Location: [facility];  Service: Orthopedics;  Laterality: Right;   COLONOSCOPY     ESOPHAGEAL MANOMETRY  05/28/2006   ESOPHAGEAL MANOMETRY N/A 11/13/2022   Procedure: ESOPHAGEAL MANOMETRY (EM);  Surgeon: Nannette Babe, MD;  Location: [facility];  Service: Gastroenterology;  Laterality: N/A;   EYE SURGERY Right    tear duct repair   NASAL SINUS SURGERY     TONSILLECTOMY     TRIGGER FINGER RELEASE Right 11/06/2017   Procedure: RELEASE TRIGGER FINGER/A-1 PULLEY RIGHT INDEX FINGER AND RIGHT MIDDLE FINGER;  Surgeon: Brunilda Capra, MD;  Location: [facility];  Service: Orthopedics;  Laterality: Right;   UPPER GASTROINTESTINAL ENDOSCOPY  10/18/2013   with dilation; with Propofol    UPPER GASTROINTESTINAL ENDOSCOPY  10/30/2016   Family History: Family History  Problem Relation Age of Onset   Allergic rhinitis Mother    Peripheral vascular disease Mother 2   Anesthesia problems Mother        post-op N/V; hard to wake up post-op   Diabetes Father    CAD Father 38   AAA (abdominal aortic aneurysm) Father    Allergic rhinitis Sister    Diabetes Sister    Stroke Sister    AAA (abdominal aortic aneurysm) Brother    Colon cancer Maternal Grandmother    Stomach cancer Maternal Grandfather    Prostate cancer Paternal  Grandfather    Inflammatory bowel disease Grandchild    Esophageal cancer Neg Hx    Rectal cancer Neg Hx    Social History: Sharel lives in a house built 50 years ago, no water damage, carpet in the bedroom, gas heating, central AC, toy poodle indoors, no roaches, not using dust mite covers on the bed of the pillows, no smoke exposure.  She is retired.  No HEPA filter in the home.  [place]   ROS:  All other systems negative except as noted per HPI.  Objective:  Blood pressure 118/64, pulse 76, temperature 98.1 F (36.7 C), height 5' (1.524 m), weight 127 lb 1.6 oz (57.7 kg), SpO2 96%. Body mass index is 24.82 kg/m. Physical Exam:  General Appearance:  Alert, cooperative, no distress, appears stated age  Head:  Normocephalic, without obvious abnormality, atraumatic  Eyes:  Conjunctiva clear, EOM's intact  Ears Wears hearing aids bilaterally, tympanic mebranes not visible due to dry cerumen in bilateral ear canal and EACs normal bilaterally  Nose: Nares normal, normal mucosa and no visible anterior polyps  Throat: Lips, tongue normal; teeth and gums normal, normal posterior oropharynx  Neck: Supple, symmetrical  Lungs:   clear to auscultation bilaterally, Respirations unlabored, no coughing  Heart:  regular rate and rhythm and no murmur, Appears well perfused  Extremities: No edema  Skin: Skin color, texture, turgor normal and no rashes or lesions on visualized portions of skin  Neurologic: No gross deficits   Diagnostics:  Labs:  Lab Orders  No laboratory test(s) ordered today     Assessment and Plan  Assessment and Plan    Chronic Rhinitis Chronic allergic rhinitis with persistent symptoms despite current management. Considering allergy immunotherapy re-initiation. - Schedule allergy testing for next Monday at 1:30 PM. - Instruct to stop antihistamines for three days before testing. - Start nasal saline spray - use prior to medicated  nasal sprays - Continue Flonase. 1 spray in each nostril daily - Add azelasteine nasal spray 1-2 sprays up to 2 times daily as needed.  - Continue Xyzal  5 mg daily as needed.   Esophageal Dysphagia Chronic esophageal dysphagia managed with esophageal relaxation medication. No documented eosinophilic esophagitis. - Follow up with GI specialist for ongoing management.  Hypertension Hypertension well-controlled with medication. Noted importance of notifying us  of use of beta-blocker if immuontherapy is to be considered. - Ensure list of current medications is available for future visits.  Sulfa allergy:  Rash years ago - recommend avoidance; can consider challenge or desensitization if strictly necessary  Eczema:  No recent breakouts in years. - use hypoallergenic moisturizers and keep skin well hydrated.  Dairy intolerance:  Feels thick coating on throat if consuming a lot of dairy - reduce dairy intake.      Follow up : next Monday at 1:30 PM- stop Xyzal and azelastine nasal spray 3 days prior to visit. It was a pleasure meeting you in clinic today! Thank you for allowing me to participate in your care.  Jonathon Neighbors, MD [facility]    This note in its entirety was forwarded to the Provider who requested this consultation.  Other: sample of neil med sinus rinse given  Thank you for your kind referral. I appreciate the opportunity to take part in Dashanae's care. Please do not hesitate to contact me with questions.  Sincerely,  Jonathon Neighbors, MD [facility]   Addendum: patient later able to find medication list-taking lisonpiril for BP only; was previously on beta-blocker but made her BP too low.

## 2024-02-06 DIAGNOSIS — H6123 Impacted cerumen, bilateral: Secondary | ICD-10-CM | POA: Diagnosis not present

## 2024-02-09 ENCOUNTER — Ambulatory Visit: Admitting: Internal Medicine

## 2024-02-09 ENCOUNTER — Encounter: Payer: Self-pay | Admitting: Internal Medicine

## 2024-02-09 DIAGNOSIS — J310 Chronic rhinitis: Secondary | ICD-10-CM

## 2024-02-09 NOTE — Progress Notes (Signed)
 Date of Service/Encounter:  02/09/24  Allergy testing appointment   Initial visit on 02/02/24, seen for chronic rhinitis, esophageal dysphagia, dairy intolerance.  Please see that note for additional details.  Today reports for allergy diagnostic testing:    DIAGNOSTICS:  Skin Testing: Environmental allergy panel. Adequate positive and negative controls. Results discussed with patient/family.  Airborne Adult Perc - 02/09/24 1323     Time Antigen Placed 1323    Allergen Manufacturer Floyd Hutchinson    Location Back    Number of Test 55    1. Control-Buffer 50% Glycerol Negative    2. Control-Histamine 3+    3. Bahia Negative    4. French Southern Territories Negative    5. Johnson Negative    6. [place]  Blue Negative    7. Meadow Fescue Negative    8. Perennial Rye Negative    9. Timothy Negative    10. Ragweed Mix Negative    11. Cocklebur Negative    12. Plantain,  English Negative    13. Baccharis Negative    14. Dog Fennel Negative    15. Russian Thistle Negative    16. Lamb's Quarters Negative    17. Sheep Sorrell Negative    18. Rough Pigweed Negative    19. Marsh Elder, Rough Negative    20. Mugwort, Common Negative    21. Box, Elder Negative    22. Cedar, red Negative    23. Sweet Gum Negative    24. Pecan Pollen Negative    25. Pine Mix Negative    26. Walnut, Black Pollen Negative    27. Red Mulberry Negative    28. Ash Mix Negative    29. Birch Mix Negative    30. Beech American Negative    31. Cottonwood, Guinea-Bissau Negative    32. Hickory, White Negative    33. Maple Mix Negative    34. Oak, Guinea-[place] Mix Negative    35. Sycamore Eastern Negative    36. Alternaria Alternata Negative    37. Cladosporium Herbarum Negative    38. Aspergillus Mix Negative    39. Penicillium Mix Negative    40. Bipolaris Sorokiniana (Helminthosporium) Negative    41. Drechslera Spicifera (Curvularia) Negative    42. Mucor Plumbeus Negative    43. Fusarium Moniliforme Negative    44.  Aureobasidium Pullulans (pullulara) Negative    45. Rhizopus Oryzae Negative    46. Botrytis Cinera Negative    47. Epicoccum Nigrum Negative    48. Phoma Betae Negative    49. Dust Mite Mix Negative    50. Cat Hair 10,000 BAU/ml Negative    51.  Dog Epithelia Negative    52. Mixed Feathers Negative    53. Horse Epithelia Negative    54. Cockroach, German Negative    55. Tobacco Leaf Negative             Intradermal - 02/09/24 1420     Time Antigen Placed 1420    Allergen Manufacturer Greer    Location Arm    Number of Test 16    Control Negative    Bahia Negative    French Southern Territories Negative    Johnson Negative    7 Grass Negative    Ragweed Mix Negative    Weed Mix Negative    Tree Mix Negative    Mold 1 Negative    Mold 2 Negative    Mold 3 Negative    Mold 4 Negative    Mite Mix Negative  Cat Negative    Dog Negative    Cockroach Negative             Allergy testing results were read and interpreted by myself, documented by clinical staff.  Patient provided with copy of allergy testing along with avoidance measures when indicated.   Jonathon Neighbors, MD  [facility] 

## 2024-02-09 NOTE — Patient Instructions (Addendum)
 Chronic Rhinitis Chronic allergic rhinitis with persistent symptoms despite current management. Considering allergy immunotherapy re-initiation. - allergy testing via skin negative with adequate controls, ID testing borderline to mold mix 4 only (unclear if clinically relevant-would not recommend allergy injections towards only these molds), confirm with lab work -if lab work negative, will refer to ENT. - Start nasal saline spray - use prior to medicated nasal sprays - Continue Flonase . 1 spray in each nostril daily - Add azelasteine nasal spray 1-2 sprays up to 2 times daily as needed.  - Continue Xyzal  5 mg daily as needed.   Esophageal Dysphagia Chronic esophageal dysphagia managed with esophageal relaxation medication. No documented eosinophilic esophagitis. - Follow up with GI specialist for ongoing management.  Hypertension Hypertension well-controlled with medication. Noted importance of notifying us  of use of beta-blocker if immuontherapy is to be considered. - Ensure list of current medications is available for future visits.  Dairy intolerance:  Feels thick coating on throat if consuming a lot of dairy - reduce dairy intake.      Follow up : 3 months, sooner if needed.  It was a pleasure seeing you again in clinic today! Thank you for allowing me to participate in your care.  Jonathon Neighbors, MD [facility]

## 2024-02-13 LAB — ALLERGENS, ZONE 2

## 2024-02-16 ENCOUNTER — Ambulatory Visit: Payer: Self-pay | Admitting: Internal Medicine

## 2024-02-16 NOTE — Progress Notes (Signed)
 Please let her know that bloodwork is negative. She is no longer a candidate for allergy  injections. We can refer to ENT if she would like.

## 2024-02-18 NOTE — Progress Notes (Signed)
 Can we refer her to Dr. Ralston Burkes ENT at [facility] for nonallergic rhinitis? Thanks!

## 2024-03-05 ENCOUNTER — Other Ambulatory Visit: Payer: Self-pay | Admitting: Internal Medicine

## 2024-03-05 DIAGNOSIS — K224 Dyskinesia of esophagus: Secondary | ICD-10-CM

## 2024-03-05 DIAGNOSIS — R1310 Dysphagia, unspecified: Secondary | ICD-10-CM

## 2024-03-05 DIAGNOSIS — K219 Gastro-esophageal reflux disease without esophagitis: Secondary | ICD-10-CM

## 2024-03-17 DIAGNOSIS — I2510 Atherosclerotic heart disease of native coronary artery without angina pectoris: Secondary | ICD-10-CM | POA: Diagnosis not present

## 2024-03-17 DIAGNOSIS — E119 Type 2 diabetes mellitus without complications: Secondary | ICD-10-CM | POA: Diagnosis not present

## 2024-03-17 DIAGNOSIS — Z794 Long term (current) use of insulin: Secondary | ICD-10-CM | POA: Diagnosis not present

## 2024-03-17 DIAGNOSIS — I10 Essential (primary) hypertension: Secondary | ICD-10-CM | POA: Diagnosis not present

## 2024-03-31 ENCOUNTER — Other Ambulatory Visit: Payer: Self-pay | Admitting: Internal Medicine

## 2024-04-07 NOTE — Progress Notes (Unsigned)
 New Patient Evaluation and Consultation  Referring Provider: Estelle Service, MD PCP: Tisovec, Richard W, MD Date of Service: 04/08/2024  SUBJECTIVE Chief Complaint: No chief complaint on file.  History of Present Illness: Adrienne [name] is a 73 y.o. White or Caucasian female seen in consultation at the request of Dr Estelle for evaluation of cystocele.    Tried pessary   ***Review of records significant for: ***T2DM on insulin  Urinary Symptoms: Leaks urine with cough/ sneeze, laughing, exercise, lifting, going from sitting to standing, with a full bladder, with movement to the bathroom, with urgency, and while asleep Leaks *** time(s) per days.  Pad use: 2-3 pads per day.   Patient is bothered by UI symptoms.  Day time voids 6-8.  Nocturia: 1-2 times per night to void. Takes Lasix at *** Voiding dysfunction:  empties bladder well.  Patient does not use a catheter to empty bladder.  When urinating, patient feels dribbling after finishing and the need to urinate multiple times in a row Drinks: ***oz water per day  UTIs: 2 UTI's in the last year.   {ACTIONS;DENIES/REPORTS:21021675::Denies} history of blood in urine, kidney or bladder stones, pyelonephritis, bladder cancer, and kidney cancer No results found for the last 90 days.   Pelvic Organ Prolapse Symptoms:                  Patient Admits to a feeling of a bulge the vaginal area. It has been present for >10  years.  Patient Admits to seeing a bulge.  This bulge is bothersome.  Bowel Symptom: Bowel movements: *** time(s) per week with history of colitis and IBS-D Stool consistency: hard Straining: yes.  Splinting: yes.  Incomplete evacuation: yes.  Patient Admits to accidental bowel leakage / fecal incontinence  Occurs: *** time(s) per {Time; day/week/month:13537}  Consistency with leakage: liquid Bowel regimen: fiber, stool softener, miralax, and colace Last colonoscopy: Results *** [facility] Colonoscopy           Completed or No Longer Recommended     Colonoscopy  Discontinued      Frequency changed to Never automatically (Topic No Longer Applies)   04/03/2021  COLONOSCOPY   Only the first 1 history entries have been loaded, but more history exists.                Sexual Function Sexually active: no.  Sexual orientation: Straight Pain with sex: {pain with sex:24762}  Pelvic Pain Denies pelvic pain   Past Medical History:  Past Medical History:  Diagnosis Date   Arthritis    hands/fingers   Carpal tunnel syndrome    Depression    Diabetes (HCC)    Eczema    Esophageal reflux    Esophageal stricture    Generalized anxiety disorder    GERD (gastroesophageal reflux disease)    Hiatal hernia    Hyperlipidemia    Hypertension    states under control with med., has been on med. x 30 yr.   Insulin dependent diabetes mellitus    Irritable bowel syndrome    Nutcracker esophagus    Trigger finger of right hand 09/2017   index and middle fingers   Wears hearing aid in both ears      Past Surgical History:   Past Surgical History:  Procedure Laterality Date   ADENOIDECTOMY     CARPAL TUNNEL RELEASE Left 01/07/2014   Procedure: LEFT CARPAL TUNNEL RELEASE;  Surgeon: Franky JONELLE Curia, MD;  Location: [facility];  Service:  Orthopedics;  Laterality: Left;   CARPAL TUNNEL RELEASE Right 03/06/2023   Procedure: RIGHT CARPAL TUNNEL RELEASE;  Surgeon: Murrell Drivers, MD;  Location: [facility];  Service: Orthopedics;  Laterality: Right;   COLONOSCOPY     ESOPHAGEAL MANOMETRY  05/28/2006   ESOPHAGEAL MANOMETRY N/A 11/13/2022   Procedure: ESOPHAGEAL MANOMETRY (EM);  Surgeon: Albertus Gordy HERO, MD;  Location: [facility];  Service: Gastroenterology;  Laterality: N/A;   EYE SURGERY Right    tear duct repair   NASAL SINUS SURGERY     TONSILLECTOMY     TRIGGER FINGER RELEASE Right 11/06/2017   Procedure: RELEASE TRIGGER FINGER/A-1 PULLEY RIGHT INDEX FINGER AND  RIGHT MIDDLE FINGER;  Surgeon: Murrell Drivers, MD;  Location: [facility];  Service: Orthopedics;  Laterality: Right;   UPPER GASTROINTESTINAL ENDOSCOPY  10/18/2013   with dilation; with Propofol    UPPER GASTROINTESTINAL ENDOSCOPY  10/30/2016     Past OB/GYN History: OB History  No obstetric history on file.    Vaginal deliveries: ***,  Forceps/ Vacuum deliveries: ***, Cesarean section: *** Menopausal: Yes, at age ***, Denies vaginal bleeding since menopause Contraception: ***. Last pap smear was ***.  Any history of abnormal pap smears: yes. No results found for: DIAGPAP, HPVHIGH, ADEQPAP  Medications: Patient has a current medication list which includes the following prescription(s): alprazolam, aspirin, azelastine , calcium carbonate, celecoxib, cetirizine hcl, freestyle libre 3 sensor, diltiazem , empagliflozin, ferrous sulfate, fluticasone , furosemide, gvoke hypopen 2-pack, insulin glargine, isosorbide mononitrate, levocetirizine, magnesium, metformin, multivitamin, pantoprazole , ramipril, rosuvastatin, tramadol, trulicity, venlafaxine xr, [DISCONTINUED] fluoxetine, and [DISCONTINUED] rosiglitazone-metformin, and the following Facility-Administered Medications: dexamethasone .   Allergies: Patient is allergic to molds & smuts, exenatide, and sulfa antibiotics.   Social History:  Social History   Tobacco Use   Smoking status: Never    Passive exposure: Past   Smokeless tobacco: Never  Vaping Use   Vaping status: Never Used  Substance Use Topics   Alcohol  use: Yes    Alcohol /week: 0.0 standard drinks of alcohol     Comment: occasionally   Drug use: No    Relationship status: married Patient lives with her husband.   Patient is not employed. Regular exercise: Yes: exercises classes 3-4x/week History of abuse: No  Family History:   Family History  Problem Relation Age of Onset   Allergic rhinitis Mother    Peripheral vascular disease Mother 38    Anesthesia problems Mother        post-op N/V; hard to wake up post-op   Diabetes Father    CAD Father 81   AAA (abdominal aortic aneurysm) Father    Allergic rhinitis Sister    Diabetes Sister    Stroke Sister    AAA (abdominal aortic aneurysm) Brother    Colon cancer Maternal Grandmother    Stomach cancer Maternal Grandfather    Prostate cancer Paternal Grandfather    Inflammatory bowel disease Grandchild    Esophageal cancer Neg Hx    Rectal cancer Neg Hx      Review of Systems: Review of Systems  Constitutional:  Positive for malaise/fatigue and weight loss. Negative for fever.  Respiratory:  Negative for cough, shortness of breath and wheezing.   Cardiovascular:  Negative for chest pain, palpitations and leg swelling.  Gastrointestinal:  Positive for abdominal pain and diarrhea. Negative for blood in stool.       Leakage  Genitourinary:  Positive for frequency and urgency. Negative for dysuria and hematuria.       Leakage, bulge  Skin:  Negative for rash.  Neurological:  Positive for dizziness. Negative for weakness and headaches.  Endo/Heme/Allergies:  Does not bruise/bleed easily.  Psychiatric/Behavioral:  Negative for depression. The patient is not nervous/anxious.      OBJECTIVE Physical Exam: There were no vitals filed for this visit.  Physical Exam Constitutional:      General: She is not in acute distress.    Appearance: Normal appearance.  Genitourinary:     Bladder and urethral meatus normal.     No lesions in the vagina.     Right Labia: No rash, tenderness, lesions, skin changes or Bartholin's cyst.    Left Labia: No tenderness, lesions, skin changes, Bartholin's cyst or rash.    No vaginal discharge, erythema, tenderness, bleeding, ulceration or granulation tissue.      Right Adnexa: not tender, not full and no mass present.    Left Adnexa: not tender, not full and no mass present.    No cervical motion tenderness, discharge, friability, lesion,  polyp or nabothian cyst.     Uterus is not enlarged, fixed, tender or irregular.     No uterine mass detected.    Urethral meatus caruncle not present.    No urethral prolapse, tenderness, mass, hypermobility or discharge present.     Bladder is not tender, urgency on palpation not present and masses not present.      Levator ani not tender, obturator internus not tender, no asymmetrical contractions present and no pelvic spasms present.    Anal wink present and BC reflex present. Cardiovascular:     Rate and Rhythm: Normal rate.  Pulmonary:     Effort: Pulmonary effort is normal. No respiratory distress.  Abdominal:     General: There is no distension.     Palpations: There is no mass.     Tenderness: There is no abdominal tenderness.     Hernia: No hernia is present.  Neurological:     Mental Status: She is alert.  Vitals reviewed. Exam conducted with a chaperone present.     POP-Q:   POP-Q                                               Aa                                               Ba                                                 C                                                Gh                                               Pb  tvl                                                Ap                                               Bp                                                 D      Rectal Exam:  Normal sphincter tone, {rectocele:24766} distal rectocele, enterocoele {DESC; PRESENT/NOT PRESENT:21021351}, no rectal masses, {sign of:24767} dyssynergia when asking the patient to bear down.  Post-Void Residual (PVR) by Bladder Scan: In order to evaluate bladder emptying, we discussed obtaining a postvoid residual and patient agreed to this procedure.  Procedure: The ultrasound unit was placed on the patient's abdomen in the suprapubic region after the patient had voided.      Laboratory Results: No results  found for: COLORU, CLARITYU, GLUCOSEUR, BILIRUBINUR, KETONESU, SPECGRAV, RBCUR, PHUR, PROTEINUR, UROBILINOGEN, LEUKOCYTESUR  Lab Results  Component Value Date   CREATININE 0.72 03/03/2023   CREATININE 0.87 10/31/2017   CREATININE 0.80 01/07/2014    No results found for: HGBA1C  Lab Results  Component Value Date   HGB 14.3 01/07/2014     ASSESSMENT AND PLAN Ms. Novitsky is a 73 y.o. with: No diagnosis found.  There are no diagnoses linked to this encounter.   Lianne ONEIDA Gillis, MD

## 2024-04-08 ENCOUNTER — Encounter: Payer: Self-pay | Admitting: Obstetrics

## 2024-04-08 ENCOUNTER — Ambulatory Visit: Admitting: Obstetrics

## 2024-04-08 VITALS — BP 129/70 | HR 101 | Ht 60.0 in | Wt 128.8 lb

## 2024-04-08 DIAGNOSIS — N952 Postmenopausal atrophic vaginitis: Secondary | ICD-10-CM

## 2024-04-08 DIAGNOSIS — R351 Nocturia: Secondary | ICD-10-CM

## 2024-04-08 DIAGNOSIS — R159 Full incontinence of feces: Secondary | ICD-10-CM

## 2024-04-08 DIAGNOSIS — N3946 Mixed incontinence: Secondary | ICD-10-CM

## 2024-04-08 DIAGNOSIS — N8110 Cystocele, unspecified: Secondary | ICD-10-CM | POA: Diagnosis not present

## 2024-04-08 DIAGNOSIS — Z466 Encounter for fitting and adjustment of urinary device: Secondary | ICD-10-CM

## 2024-04-08 DIAGNOSIS — K5900 Constipation, unspecified: Secondary | ICD-10-CM | POA: Diagnosis not present

## 2024-04-08 DIAGNOSIS — Z960 Presence of urogenital implants: Secondary | ICD-10-CM | POA: Insufficient documentation

## 2024-04-08 LAB — POCT URINALYSIS DIP (CLINITEK)
Bilirubin, UA: NEGATIVE
Blood, UA: NEGATIVE
Glucose, UA: 500 mg/dL — AB
Ketones, POC UA: NEGATIVE mg/dL
Leukocytes, UA: NEGATIVE
Nitrite, UA: NEGATIVE
POC PROTEIN,UA: NEGATIVE
Spec Grav, UA: 1.01 (ref 1.010–1.025)
Urobilinogen, UA: 0.2 U/dL
pH, UA: 5.5 (ref 5.0–8.0)

## 2024-04-08 MED ORDER — ESTRADIOL 0.1 MG/GM VA CREA: 0.5000 g | TOPICAL_CREAM | VAGINAL | 3 refills | Status: DC

## 2024-04-08 MED ORDER — GEMTESA 75 MG PO TABS: 75.0000 mg | ORAL_TABLET | Freq: Every day | ORAL | 2 refills | Status: DC

## 2024-04-08 MED ORDER — GEMTESA 75 MG PO TABS: 75.0000 mg | ORAL_TABLET | Freq: Every day | ORAL | Status: DC

## 2024-04-08 NOTE — Patient Instructions (Addendum)
 You have a stage 2 (out of 4) prolapse.  We discussed the fact that it is not life threatening but there are several treatment options. For treatment of pelvic organ prolapse, we discussed options for management including expectant management, conservative management, and surgical management, such as Kegels, a pessary, pelvic floor physical therapy, and specific surgical procedures.     You are opting to try a size 3 pessary. This will keep the bulge inside and prevent it from getting worse. You can learn how to remove it yourself or we can do that for you every 3 months.   For treatment of stress urinary incontinence, which is leakage with physical activity/movement/strainging/coughing, we discussed expectant management versus nonsurgical options versus surgery. Nonsurgical options include weight loss, physical therapy, as well as a pessary.  Surgical options include a midurethral sling, which is a synthetic mesh sling that acts like a hammock under the urethra to prevent leakage of urine, a Burch urethropexy, and transurethral injection of a bulking agent.   We discussed the symptoms of overactive bladder (OAB), which include urinary urgency, urinary frequency, night-time urination, with or without urge incontinence.  We discussed management including behavioral therapy (decreasing bladder irritants by following a bladder diet, urge suppression strategies, timed voids, bladder retraining), physical therapy, medication; and for refractory cases posterior tibial nerve stimulation, sacral neuromodulation, and intravesical botulinum toxin injection.   Constipation: Our goal is to achieve formed bowel movements daily or every-other-day.  You may need to try different combinations of the following options to find what works best for you - everybody's body works differently so feel free to adjust the dosages as needed.  Some options to help maintain bowel health include:  Dietary changes (more leafy greens,  vegetables and fruits; less processed foods) Fiber supplementation (Benefiber, FiberCon, Metamucil or Psyllium). Start slow and increase gradually to full dose. Over-the-counter agents such as: stool softeners (Docusate or Colace) and/or laxatives (Miralax, milk of magnesia)  Power Pudding is a natural mixture that may help your constipation.  To make blend 1 cup applesauce, 1 cup wheat bran, and 3/4 cup prune juice, refrigerate and then take 1 tablespoon daily with a large glass of water as needed.   Women should try to eat at least 21 to 25 grams of fiber a day, while men should aim for 30 to 38 grams a day. You can add fiber to your diet with food or a fiber supplement such as psyllium (metamucil), benefiber, or fibercon.   Here's a look at how much dietary fiber is found in some common foods. When buying packaged foods, check the Nutrition Facts label for fiber content. It can vary among brands.  Fruits Serving size Total fiber (grams)*  Raspberries 1 cup 8.0  Pear 1 medium 5.5  Apple, with skin 1 medium 4.5  Banana 1 medium 3.0  Orange 1 medium 3.0  Strawberries 1 cup 3.0   Vegetables Serving size Total fiber (grams)*  Green peas, boiled 1 cup 9.0  Broccoli, boiled 1 cup chopped 5.0  Turnip greens, boiled 1 cup 5.0  Brussels sprouts, boiled 1 cup 4.0  Potato, with skin, baked 1 medium 4.0  Sweet corn, boiled 1 cup 3.5  Cauliflower, raw 1 cup chopped 2.0  Carrot, raw 1 medium 1.5   Grains Serving size Total fiber (grams)*  Spaghetti, whole-wheat, cooked 1 cup 6.0  Barley, pearled, cooked 1 cup 6.0  Bran flakes 3/4 cup 5.5  Quinoa, cooked 1 cup 5.0  Oat bran muffin 1  medium 5.0  Oatmeal, instant, cooked 1 cup 5.0  Popcorn, air-popped 3 cups 3.5  Brown rice, cooked 1 cup 3.5  Bread, whole-wheat 1 slice 2.0  Bread, rye 1 slice 2.0   Legumes, nuts and seeds Serving size Total fiber (grams)*  Split peas, boiled 1 cup 16.0  Lentils, boiled 1 cup 15.5  Black beans, boiled 1  cup 15.0  Baked beans, canned 1 cup 10.0  Chia seeds 1 ounce 10.0  Almonds 1 ounce (23 nuts) 3.5  Pistachios 1 ounce (49 nuts) 3.0  Sunflower kernels 1 ounce 3.0  *Rounded to nearest 0.5 gram. Source: [employer] for [employer], Legacy Release    Accidental Bowel Leakage:  - Treatment options include anti-diarrhea medication (loperamide/ Imodium OTC or prescription lomotil), fiber supplements, physical therapy, and possible sacral neuromodulation or surgery.     For vaginal atrophy (thinning of the vaginal tissue that can cause dryness and burning) and UTI prevention we discussed estrogen replacement in the form of vaginal cream.   Start vaginal estrogen therapy nightly for two weeks then 2 times weekly at night. This can be placed with your finger or an applicator inside the vagina and around the urethra.  Please let us  know if the prescription is too expensive and we can look for alternative options.   Is vaginal estrogen therapy safe for me? Vaginal estrogen preparations act on the vaginal skin, and only a very tiny amount is absorbed into the bloodstream (0.01%).  They work in a similar way to hand or face cream.  There is minimal absorption and they are therefore perfectly safe. If you have had breast cancer and have persistent troublesome symptoms which aren't settling with vaginal moisturisers and lubricants, local estrogen treatment may be a possibility, but consultation with your oncologist should take place first.

## 2024-04-08 NOTE — Assessment & Plan Note (Addendum)
-   history of IBS-D trigger by stress, recently with constipation managed with colace - Treatment options include anti-diarrhea medication (loperamide/ Imodium OTC or prescription lomotil), fiber supplements, physical therapy, and possible sacral neuromodulation or surgery.   - denies blood in stool, unintended weight loss - encouraged Kegel exercises, caffeine reduction, titration of fiber supplementation and squatting position for defecation

## 2024-04-08 NOTE — Assessment & Plan Note (Addendum)
-   avoid fluid intake 3 hours before bedtime - switch diuretic (e.g. furosemide) dosing to 2-6pm - reduce caffeine intake - trial of Gemtesa

## 2024-04-08 NOTE — Assessment & Plan Note (Signed)
-   worsened SUI after size 3 ring with support pessary - discussed risk of change in urinary or bowel symptoms, vaginal ulceration, discharge, bleeding, fistula formation. Explained that pt may require multiple sizes and types for fitting.  - trial of size 3 incontinence dish pessary, declines trial of size 4 incontinence dish pessary due to discomfort at the time of pessary removal. Pt to return in 3-4 weeks after estrogen use and topical lidocaine  to reassess symptoms.

## 2024-04-08 NOTE — Assessment & Plan Note (Addendum)
-   size #3 ring with support pessary in place since 12/09/23, declines self management - For treatment of pelvic organ prolapse, we discussed options for management including expectant management, conservative management, and surgical management, such as Kegels, a pessary, pelvic floor physical therapy, and specific surgical procedures. - size 3 ring with support pessary with relief, worsened SUI symptoms - We discussed 3 options for prolapse repair:  1) vaginal repair without mesh - Pros - safer, no mesh complications - Cons - not as strong as mesh repair, higher risk of recurrence  2) laparoscopic repair with mesh - Pros - stronger, better long-term success - Cons - risks of mesh implant (erosion into vagina or bladder, adhering to the rectum, pain) - these risks are lower than with a vaginal mesh but still exist  3) Vaginal closure procedure - pros - strong, good long-term success - cons - unable to have penetrative intercourse - considering vaginal suspension, discussed risks and benefits of uterine preservation and encouraged pt to consider options - denies h/o PMB, CKC, or LEEP

## 2024-04-08 NOTE — Assessment & Plan Note (Addendum)
-   superficial abrasion with bleeding with pessary removal - For symptomatic vaginal atrophy options include lubrication with a water-based lubricant, personal hygiene measures and barrier protection against wetness, and estrogen replacement in the form of vaginal cream, vaginal tablets, or a time-released vaginal ring.   - encouraged to resume low dose vaginal atrophy

## 2024-04-08 NOTE — Assessment & Plan Note (Signed)
-   For constipation, we reviewed the importance of a better bowel regimen.  We also discussed the importance of avoiding chronic straining, as it can exacerbate her pelvic floor symptoms; we discussed treating constipation and straining prior to surgery, as postoperative straining can lead to damage to the repair and recurrence of symptoms. We discussed initiating therapy with increasing fluid intake, fiber supplementation, stool softeners, and laxatives such as miralax.  - encouraged squatting position for defecation, fiber supplementation

## 2024-04-08 NOTE — Assessment & Plan Note (Addendum)
-   POCT UA + glucose only due to T2DM, PVR 16mL - worsened after pessary use, stress > urgency - For treatment of stress urinary incontinence,  non-surgical options include expectant management, weight loss, physical therapy, as well as a pessary.  Surgical options include a midurethral sling, Burch urethropexy, and transurethral injection of a bulking agent. - trial of size 3 incontinence dish pessary, consider size 4 incontinence dish pessary if no relief - We discussed the symptoms of overactive bladder (OAB), which include urinary urgency, urinary frequency, nocturia, with or without urge incontinence.  While we do not know the exact etiology of OAB, several treatment options exist. We discussed management including behavioral therapy (decreasing bladder irritants, urge suppression strategies, timed voids, bladder retraining), physical therapy, medication; for refractory cases posterior tibial nerve stimulation, sacral neuromodulation, and intravesical botulinum toxin injection.  For anticholinergic medications, we discussed the potential side effects of anticholinergics including dry eyes, dry mouth, constipation, cognitive impairment and urinary retention. For Beta-3 agonist medication, we discussed the potential side effect of elevated blood pressure which is more likely to occur in individuals with uncontrolled hypertension. - samples and Rx for Gemtesa  provided, avoid anti-cholinergics due to dry mouth and constipation - discussed fluid management and reduction of caffeine use - encouraged and reviewed Kegel exercises

## 2024-04-27 NOTE — Telephone Encounter (Signed)
 Dr. Luciano office will be contacted by Nat Glatter

## 2024-05-04 DIAGNOSIS — K219 Gastro-esophageal reflux disease without esophagitis: Secondary | ICD-10-CM | POA: Diagnosis not present

## 2024-05-04 DIAGNOSIS — J320 Chronic maxillary sinusitis: Secondary | ICD-10-CM | POA: Diagnosis not present

## 2024-05-04 DIAGNOSIS — J3489 Other specified disorders of nose and nasal sinuses: Secondary | ICD-10-CM | POA: Diagnosis not present

## 2024-05-04 DIAGNOSIS — R0982 Postnasal drip: Secondary | ICD-10-CM | POA: Diagnosis not present

## 2024-05-04 DIAGNOSIS — Z9889 Other specified postprocedural states: Secondary | ICD-10-CM | POA: Diagnosis not present

## 2024-05-06 ENCOUNTER — Other Ambulatory Visit: Payer: Self-pay | Admitting: Obstetrics

## 2024-05-06 ENCOUNTER — Ambulatory Visit: Admitting: Obstetrics and Gynecology

## 2024-05-06 VITALS — BP 136/80 | HR 79

## 2024-05-06 DIAGNOSIS — N8110 Cystocele, unspecified: Secondary | ICD-10-CM

## 2024-05-06 DIAGNOSIS — N393 Stress incontinence (female) (male): Secondary | ICD-10-CM

## 2024-05-06 DIAGNOSIS — N3946 Mixed incontinence: Secondary | ICD-10-CM

## 2024-05-06 DIAGNOSIS — N952 Postmenopausal atrophic vaginitis: Secondary | ICD-10-CM | POA: Diagnosis not present

## 2024-05-06 DIAGNOSIS — N813 Complete uterovaginal prolapse: Secondary | ICD-10-CM

## 2024-05-06 NOTE — Patient Instructions (Signed)
 We will plan for a hysteropexy with a sling   Please use the miralax daily for your bowels.   Continue to use the estrogen cream x2 weekly.

## 2024-05-06 NOTE — Progress Notes (Signed)
 [facility]   Subjective:     Chief Complaint:  Chief Complaint  Patient presents with   Follow-up    Adrienne [name] is a 73 y.o. female s here for pessary check.   History of Present Illness: Adrienne [name] is a 73 y.o. female with stage III pelvic organ prolapse and stress incontinence who presents for a pessary check. She is using a size #3 incontinence dish pessary. The pessary has been working well and she has no complaints. She is using vaginal estrogen. She denies vaginal bleeding.  Past Medical History: Patient  has a past medical history of Allergies, Arthritis, Carpal tunnel syndrome, Depression, Diabetes (HCC), Eczema, Esophageal reflux, Esophageal stricture, Generalized anxiety disorder, GERD (gastroesophageal reflux disease), Hiatal hernia, Hyperlipidemia, Hypertension, Insulin dependent diabetes mellitus, Irritable bowel syndrome, Nutcracker esophagus, Trigger finger of right hand (09/2017), Vertigo, and Wears hearing aid in both ears.   Past Surgical History: She  has a past surgical history that includes Tonsillectomy; Nasal sinus surgery; Colonoscopy; Upper gastrointestinal endoscopy (10/18/2013); Carpal tunnel release (Left, 01/07/2014); Esophageal manometry (05/28/2006); Upper gastrointestinal endoscopy (10/30/2016); Eye surgery (Right); Trigger finger release (Right, 11/06/2017); Esophageal manometry (N/A, 11/13/2022); Carpal tunnel release (Right, 03/06/2023); and Adenoidectomy.   Medications: She has a current medication list which includes the following prescription(s): alprazolam, aspirin, azelastine , calcium carbonate, celecoxib, cetirizine hcl, freestyle libre 3 sensor, diltiazem , empagliflozin, estradiol , ferrous sulfate, fluticasone , furosemide, gvoke hypopen 2-pack, insulin glargine, isosorbide mononitrate, levocetirizine, metformin, multivitamin, pantoprazole , ramipril, rosuvastatin, tramadol, trulicity, venlafaxine xr, gemtesa , gemtesa ,  [DISCONTINUED] fluoxetine, and [DISCONTINUED] rosiglitazone-metformin, and the following Facility-Administered Medications: dexamethasone .   Allergies: Patient is allergic to molds & smuts, exenatide, and sulfa antibiotics.   Social History: Patient  reports that she has never smoked. She has been exposed to tobacco smoke. She has never used smokeless tobacco. She reports current alcohol  use. She reports that she does not use drugs.      Objective:    Physical Exam: There were no vitals taken for this visit. Gen: No apparent distress, A&O x 3. Detailed Urogynecologic Evaluation:   Apical prolapse not seen on vaginal exam. With cough and valsalva, no obvious cervical decent.   Pelvic Exam: Normal external female genitalia; Bartholin's and Skene's glands normal in appearance; urethral meatus normal in appearance, no urethral masses or discharge. The pessary was noted to be in place. It was removed and cleaned. Speculum exam revealed no lesions in the vagina. The pessary was replaced. It was comfortable to the patient and fit well.   Verbal consent was obtained to perform simple CMG procedure:   Prolapse was reduced using 2 large cotton swabs. Urethra was prepped with betadine and a 63F catheter was placed and bladder was drained completely. The bladder was then backfilled with sterile water by gravity.  First sensation: 120 First Desire: 220 Strong Desire: 250 Capacity: 300 Cough stress test was positive. Valsalva stress test was positive.  She was was allowed to void on her own.   Interpretation: CMG showed within normal limits sensation, and decreased cystometric capacity. Findings positive for stress incontinence, negative for detrusor overactivity.      Assessment/Plan:    Assessment: Adrienne [name] is a 73 y.o. with stage III pelvic organ prolapse and stress incontinence here for a pessary check. She is doing well.  Plan: She will keep the pessary in place until next visit.  She will continue to use estrogen. She will follow-up in 3 months for a pessary check or sooner as needed.  Patient and I had a long discussion of her options and longevity of her care. She does not like the pessary as she reports it causes her significant pain being removed and she does not want to manage it herself.   She wants to proceed with surgical repair. We discussed with surgical mesh there are 2-4% mesh erosion/exposure, 0-7% new onset pain with intercourse and less than 1% of infection. Handouts given on anterior repair and vaginal mesh slings to review. She is actively working to decrease consumption of irritative substances and is working on her bowel movements (currently constipated). She will increase miralax use.   All questions were answered.

## 2024-05-06 NOTE — Progress Notes (Signed)
 Referral for surgery.  Desires to proceed with anterior/posterior repair, midurethral sling, cystoscopy.  Letter sent to PCP for perioperative risk stratification due to T2DM.

## 2024-05-09 ENCOUNTER — Other Ambulatory Visit: Payer: Self-pay | Admitting: Internal Medicine

## 2024-05-09 DIAGNOSIS — K224 Dyskinesia of esophagus: Secondary | ICD-10-CM

## 2024-05-09 DIAGNOSIS — K219 Gastro-esophageal reflux disease without esophagitis: Secondary | ICD-10-CM

## 2024-05-09 DIAGNOSIS — R1310 Dysphagia, unspecified: Secondary | ICD-10-CM

## 2024-05-11 DIAGNOSIS — Z0189 Encounter for other specified special examinations: Secondary | ICD-10-CM | POA: Diagnosis not present

## 2024-05-13 ENCOUNTER — Other Ambulatory Visit: Payer: Self-pay | Admitting: Internal Medicine

## 2024-05-14 DIAGNOSIS — E7800 Pure hypercholesterolemia, unspecified: Secondary | ICD-10-CM | POA: Diagnosis not present

## 2024-05-14 DIAGNOSIS — F418 Other specified anxiety disorders: Secondary | ICD-10-CM | POA: Diagnosis not present

## 2024-05-14 DIAGNOSIS — I700 Atherosclerosis of aorta: Secondary | ICD-10-CM | POA: Diagnosis not present

## 2024-05-14 DIAGNOSIS — Z794 Long term (current) use of insulin: Secondary | ICD-10-CM | POA: Diagnosis not present

## 2024-05-14 DIAGNOSIS — K589 Irritable bowel syndrome without diarrhea: Secondary | ICD-10-CM | POA: Diagnosis not present

## 2024-05-14 DIAGNOSIS — I119 Hypertensive heart disease without heart failure: Secondary | ICD-10-CM | POA: Diagnosis not present

## 2024-05-14 DIAGNOSIS — F3130 Bipolar disorder, current episode depressed, mild or moderate severity, unspecified: Secondary | ICD-10-CM | POA: Diagnosis not present

## 2024-05-14 DIAGNOSIS — I209 Angina pectoris, unspecified: Secondary | ICD-10-CM | POA: Diagnosis not present

## 2024-05-14 DIAGNOSIS — I2584 Coronary atherosclerosis due to calcified coronary lesion: Secondary | ICD-10-CM | POA: Diagnosis not present

## 2024-05-14 DIAGNOSIS — M5416 Radiculopathy, lumbar region: Secondary | ICD-10-CM | POA: Diagnosis not present

## 2024-05-14 DIAGNOSIS — I517 Cardiomegaly: Secondary | ICD-10-CM | POA: Diagnosis not present

## 2024-05-14 DIAGNOSIS — E119 Type 2 diabetes mellitus without complications: Secondary | ICD-10-CM | POA: Diagnosis not present

## 2024-05-14 DIAGNOSIS — I2510 Atherosclerotic heart disease of native coronary artery without angina pectoris: Secondary | ICD-10-CM | POA: Diagnosis not present

## 2024-05-14 DIAGNOSIS — I10 Essential (primary) hypertension: Secondary | ICD-10-CM | POA: Diagnosis not present

## 2024-06-09 ENCOUNTER — Other Ambulatory Visit: Payer: Self-pay | Admitting: Internal Medicine

## 2024-06-16 DIAGNOSIS — M79642 Pain in left hand: Secondary | ICD-10-CM | POA: Diagnosis not present

## 2024-06-16 DIAGNOSIS — M65342 Trigger finger, left ring finger: Secondary | ICD-10-CM | POA: Diagnosis not present

## 2024-06-16 DIAGNOSIS — M65311 Trigger thumb, right thumb: Secondary | ICD-10-CM | POA: Diagnosis not present

## 2024-06-23 DIAGNOSIS — M5416 Radiculopathy, lumbar region: Secondary | ICD-10-CM | POA: Diagnosis not present

## 2024-06-29 ENCOUNTER — Encounter: Payer: Self-pay | Admitting: Internal Medicine

## 2024-06-29 ENCOUNTER — Encounter: Payer: Self-pay | Admitting: Obstetrics

## 2024-06-30 ENCOUNTER — Encounter: Payer: Self-pay | Admitting: Internal Medicine

## 2024-06-30 ENCOUNTER — Ambulatory Visit (INDEPENDENT_AMBULATORY_CARE_PROVIDER_SITE_OTHER): Admitting: Internal Medicine

## 2024-06-30 VITALS — BP 118/60 | HR 52 | Ht 60.0 in | Wt 129.0 lb

## 2024-06-30 DIAGNOSIS — Z860101 Personal history of adenomatous and serrated colon polyps: Secondary | ICD-10-CM | POA: Diagnosis not present

## 2024-06-30 DIAGNOSIS — R1310 Dysphagia, unspecified: Secondary | ICD-10-CM | POA: Diagnosis not present

## 2024-06-30 DIAGNOSIS — K5900 Constipation, unspecified: Secondary | ICD-10-CM

## 2024-06-30 DIAGNOSIS — K219 Gastro-esophageal reflux disease without esophagitis: Secondary | ICD-10-CM | POA: Diagnosis not present

## 2024-06-30 DIAGNOSIS — R1319 Other dysphagia: Secondary | ICD-10-CM

## 2024-06-30 DIAGNOSIS — K224 Dyskinesia of esophagus: Secondary | ICD-10-CM

## 2024-06-30 MED ORDER — VOQUEZNA 20 MG PO TABS: 1.0000 | ORAL_TABLET | Freq: Every day | ORAL | 5 refills | Status: AC

## 2024-06-30 NOTE — Progress Notes (Signed)
 Subjective:    Patient ID: Adrienne [name], female    DOB: 30-Jun-1951, 73 y.o.   MRN: [id number]  HPI Adrienne [name] is a 73 year old female with hypercontractile esophagus, GERD, history of IBS with loose stools, anxiety, diabetes, hyperlipidemia who presents with dysphagia and throat clearing.  She is here alone today..  She has a history of hypercontractile esophagus and GERD. Her dysphagia has shown some improvement with prior dilation and the use of diltiazem  and twice-daily PPI.  Despite these treatments, she continues to experience throat clearing, hoarseness, and occasional acid reflux into her mouth. Her symptoms include chest discomfort, nausea, and a sensation of throat clogging, particularly at night.  An upper endoscopy on December 22, 2023, showed a mildly tortuous lower third of the esophagus but was otherwise normal. Manometry previously showed slightly elevated UES pressure.  Her distal contractile interval was elevated at over 9000 mmHg.  Her last colonoscopy on April 03, 2021, revealed a 3mm adenomatous polyp.  Her current medications include pantoprazole  40 mg twice daily, venlafaxine 37.5 mg daily, isosorbide mononitrate 30 mg, diltiazem  240 mg every 24 hours, and slow-release iron. She has been using a product called Reflux Gourmet, which contains calcium, sodium, and sodium bicarbonate, to help manage her symptoms, though she notes only slight improvement.  This product was recommended by her ENT.  She reports a history of stress, particularly in March and May, due to family events, which she believes may have exacerbated her symptoms. She also experienced sciatica and constipation, which she attributes to tramadol use and a pessary for bladder prolapse. She has been managing constipation with daily Miralax, which has improved her bowel movements.  Her blood sugar drops at night, requiring her to eat, which complicates her reflux management. She has been adjusting her insulin  dosage in response.   Review of Systems As per HPI, otherwise negative  Current Medications, Allergies, Past Medical History, Past Surgical History, Family History and Social History were reviewed in [employer] record.    Objective:   Physical Exam BP 118/60   Pulse (!) 52   Ht 5' (1.524 m)   Wt 129 lb (58.5 kg)   BMI 25.19 kg/m  Gen: awake, alert, NAD HEENT: anicteric  Abd: soft, NT/ND, +BS throughout Ext: no c/c/e Neuro: nonfocal  DIAGNOSTIC Upper endoscopy: Mildly tortuous lower third of esophagus, otherwise normal esophagus, normal stomach and duodenum (12/22/2023)  Colonoscopy: 3 mm adenomatous polyp (04/03/2021)  Esophageal manometry: Slightly elevated UES pressure, normal LES relaxation, hiatal hernia, hypercontractile esophagus (10/2022)      Assessment & Plan:  Hypercontractile esophagus with dysphagia Known hypercontractile esophagus with dysphagia, improved but not resolved with diltiazem  and BID PPI.  She is also on a long-acting nitrate.  Mildly tortuous lower esophagus, elevated UES pressure. Symptoms likely due to esophageal spasm. Recent dilation may have provided some relief, but symptoms persist. - Continue diltiazem  240 mg every 24 hours. - Consider Botox injection to the UES if symptoms persist. - Consider PDE5 inhibitor trial if no improvement.  Gastroesophageal reflux disease (GERD) refractory to PPI therapy GERD with persistent symptoms despite pantoprazole  40 mg BID. Proposed switch to vonoprazan for stronger acid reduction. Symptoms include heartburn and esophageal discomfort. - Discontinue pantoprazole . - Start vonoprazan (Voquezna) 20 mg once daily. - Advise to lay down with an empty stomach to reduce reflux. - Discuss with primary care physician about adjusting insulin regimen due to nocturnal hypoglycemia.  Constipation Constipation exacerbated by tramadol and  pessary use, improved with daily Miralax. IBS with  diarrhea, but bowel movements better managed. - Continue daily Miralax. - Avoid using a pessary if it worsens constipation.  History of small adenomatous colon polyp -- Consideration of recall colonoscopy July 2029  30 minutes total spent today including patient facing time, coordination of care, reviewing medical history/procedures/pertinent radiology studies, and documentation of the encounter.

## 2024-06-30 NOTE — Patient Instructions (Signed)
 Discontinue pantoprazole .   We have given you samples of Voquezna to start once daily. We have sent a prescription to [facility] which is a mail order pharmacy to get it the cheapest possible price. They will mail you the prescription.   Continue diltiazem  and Miralax daily.   Follow up with one of our PA's in 8-12 weeks. We do not have an schedule out for December/January. Please call our office back.  _______________________________________________________  If your blood pressure at your visit was 140/90 or greater, please contact your primary care physician to follow up on this.  _______________________________________________________  If you are age 74 or older, your body mass index should be between 23-30. Your Body mass index is 25.19 kg/m. If this is out of the aforementioned range listed, please consider follow up with your Primary Care Provider.  If you are age 90 or younger, your body mass index should be between 19-25. Your Body mass index is 25.19 kg/m. If this is out of the aformentioned range listed, please consider follow up with your Primary Care Provider.   ________________________________________________________  The [facility] providers would like to encourage you to use MYCHART to communicate with providers for non-urgent requests or questions.  Due to long hold times on the telephone, sending your provider a message by [facility] may be a faster and more efficient way to get a response.  Please allow 48 business hours for a response.  Please remember that this is for non-urgent requests.  _______________________________________________________  [facility] is using a team-based approach to care.  Your team is made up of your doctor and two to three APPS. Our APPS (Nurse Practitioners and Physician Assistants) work with your physician to ensure care continuity for you. They are fully qualified to address your health concerns and develop a treatment plan. They  communicate directly with your gastroenterologist to care for you. Seeing the Advanced Practice Practitioners on your physician's team can help you by facilitating care more promptly, often allowing for earlier appointments, access to diagnostic testing, procedures, and other specialty referrals.

## 2024-07-02 ENCOUNTER — Other Ambulatory Visit: Payer: Self-pay | Admitting: Obstetrics

## 2024-07-02 DIAGNOSIS — N3946 Mixed incontinence: Secondary | ICD-10-CM

## 2024-07-05 ENCOUNTER — Encounter: Payer: Self-pay | Admitting: *Deleted

## 2024-07-07 ENCOUNTER — Other Ambulatory Visit: Payer: Self-pay | Admitting: Internal Medicine

## 2024-07-07 DIAGNOSIS — K219 Gastro-esophageal reflux disease without esophagitis: Secondary | ICD-10-CM

## 2024-07-07 DIAGNOSIS — Z23 Encounter for immunization: Secondary | ICD-10-CM | POA: Diagnosis not present

## 2024-07-07 DIAGNOSIS — J302 Other seasonal allergic rhinitis: Secondary | ICD-10-CM | POA: Diagnosis not present

## 2024-07-07 DIAGNOSIS — K224 Dyskinesia of esophagus: Secondary | ICD-10-CM

## 2024-07-07 DIAGNOSIS — Z794 Long term (current) use of insulin: Secondary | ICD-10-CM | POA: Diagnosis not present

## 2024-07-07 DIAGNOSIS — D692 Other nonthrombocytopenic purpura: Secondary | ICD-10-CM | POA: Diagnosis not present

## 2024-07-07 DIAGNOSIS — I119 Hypertensive heart disease without heart failure: Secondary | ICD-10-CM | POA: Diagnosis not present

## 2024-07-07 DIAGNOSIS — I2584 Coronary atherosclerosis due to calcified coronary lesion: Secondary | ICD-10-CM | POA: Diagnosis not present

## 2024-07-07 DIAGNOSIS — E663 Overweight: Secondary | ICD-10-CM | POA: Diagnosis not present

## 2024-07-07 DIAGNOSIS — E7800 Pure hypercholesterolemia, unspecified: Secondary | ICD-10-CM | POA: Diagnosis not present

## 2024-07-07 DIAGNOSIS — M8580 Other specified disorders of bone density and structure, unspecified site: Secondary | ICD-10-CM | POA: Diagnosis not present

## 2024-07-07 DIAGNOSIS — R1310 Dysphagia, unspecified: Secondary | ICD-10-CM

## 2024-07-07 DIAGNOSIS — E119 Type 2 diabetes mellitus without complications: Secondary | ICD-10-CM | POA: Diagnosis not present

## 2024-07-07 DIAGNOSIS — I209 Angina pectoris, unspecified: Secondary | ICD-10-CM | POA: Diagnosis not present

## 2024-07-13 NOTE — Telephone Encounter (Signed)
 Patient scheduled for surgery. Pt informed KD

## 2024-07-26 ENCOUNTER — Encounter: Payer: Self-pay | Admitting: *Deleted

## 2024-07-26 ENCOUNTER — Other Ambulatory Visit: Payer: Self-pay | Admitting: Internal Medicine

## 2024-07-26 NOTE — Progress Notes (Signed)
 Note letter created KD

## 2024-07-27 DIAGNOSIS — M65342 Trigger finger, left ring finger: Secondary | ICD-10-CM | POA: Diagnosis not present

## 2024-07-27 DIAGNOSIS — M65311 Trigger thumb, right thumb: Secondary | ICD-10-CM | POA: Diagnosis not present

## 2024-08-02 ENCOUNTER — Other Ambulatory Visit: Payer: Self-pay | Admitting: Obstetrics

## 2024-08-02 DIAGNOSIS — N3946 Mixed incontinence: Secondary | ICD-10-CM

## 2024-08-02 DIAGNOSIS — N952 Postmenopausal atrophic vaginitis: Secondary | ICD-10-CM

## 2024-08-04 ENCOUNTER — Encounter: Payer: Self-pay | Admitting: Obstetrics

## 2024-08-04 ENCOUNTER — Ambulatory Visit (INDEPENDENT_AMBULATORY_CARE_PROVIDER_SITE_OTHER): Admitting: Obstetrics

## 2024-08-04 VITALS — BP 110/62 | HR 55 | Ht 60.0 in | Wt 129.0 lb

## 2024-08-04 DIAGNOSIS — R300 Dysuria: Secondary | ICD-10-CM | POA: Diagnosis not present

## 2024-08-04 DIAGNOSIS — K5900 Constipation, unspecified: Secondary | ICD-10-CM

## 2024-08-04 DIAGNOSIS — R159 Full incontinence of feces: Secondary | ICD-10-CM

## 2024-08-04 DIAGNOSIS — N3946 Mixed incontinence: Secondary | ICD-10-CM

## 2024-08-04 DIAGNOSIS — N952 Postmenopausal atrophic vaginitis: Secondary | ICD-10-CM

## 2024-08-04 DIAGNOSIS — N8110 Cystocele, unspecified: Secondary | ICD-10-CM | POA: Diagnosis not present

## 2024-08-04 MED ORDER — NITROFURANTOIN MONOHYD MACRO 100 MG PO CAPS: 100.0000 mg | ORAL_CAPSULE | Freq: Two times a day (BID) | ORAL | 0 refills | Status: AC

## 2024-08-04 NOTE — Progress Notes (Signed)
 [facility] Pre-Operative H&P  Subjective Chief Complaint: Adrienne [name] presents for a preoperative encounter.   History of Present Illness: IMBERLY [name] is a 73 y.o. female who presents for preoperative visit.  She is scheduled to undergo anterior/posterior repair, perineorrhaphy, midurethral sling, cystoscopy on 08/18/24.  Her symptoms include stage II pelvic organ prolapse, mixed urinary incontinence, constipation, fecal incontinence, pessary use, nocturia, vaginal atrophy, and she was was found to have Stage III pelvic organ prolapse.   Friend present for appointment to review surgical options, desires to discuss risks and benefits of hysterectomy in addition to options for anti-incontinence procedure. T2DM with HbA1C 6.7 on 07/07/24 on insulin and holding lasix and ASA 1 week preop per PCP pre-operative risk assessment. Reports intermittent glucose in the 200s with insulin use. Trial of size 3 incontinence dish pessary, expelled around 4 weeks ago and unable to replace. Desires replacement due to discomfort with vaginal bulge. Gemtesa  samples with relief, however not covered by insurance Reports burning for urination for 1 month, denies blood in urine, fever/chills.  Using vaginal estrogen daily for 1 month Titration of miralax, using 1 pack/day with 2-3 Bristol I stool/day  Simple CMG 05/06/24 showed: First sensation: 120 First Desire: 220 Strong Desire: 250 Capacity: 300 Cough stress test was positive. Valsalva stress test was positive.  She was was allowed to void on her own.   Interpretation: CMG showed within normal limits sensation, and decreased cystometric capacity. Findings positive for stress incontinence, negative for detrusor overactivity.  Past Medical History:  Diagnosis Date   Allergies    Arthritis    hands/fingers   Carpal tunnel syndrome    Depression    Diabetes (HCC)    Eczema    Esophageal reflux    Esophageal stricture    Female  bladder prolapse    Generalized anxiety disorder    GERD (gastroesophageal reflux disease)    Hiatal hernia    Hyperlipidemia    Hypertension    states under control with med., has been on med. x 30 yr.   Insulin dependent diabetes mellitus    Irritable bowel syndrome    Nutcracker esophagus    Trigger finger of right hand 09/2017   index and middle fingers   Vertigo    Wears hearing aid in both ears      Past Surgical History:  Procedure Laterality Date   ADENOIDECTOMY     CARPAL TUNNEL RELEASE Left 01/07/2014   Procedure: LEFT CARPAL TUNNEL RELEASE;  Surgeon: Franky JONELLE Curia, MD;  Location: [facility];  Service: Orthopedics;  Laterality: Left;   CARPAL TUNNEL RELEASE Right 03/06/2023   Procedure: RIGHT CARPAL TUNNEL RELEASE;  Surgeon: Curia Franky, MD;  Location: [facility];  Service: Orthopedics;  Laterality: Right;   COLONOSCOPY     ESOPHAGEAL MANOMETRY  05/28/2006   ESOPHAGEAL MANOMETRY N/A 11/13/2022   Procedure: ESOPHAGEAL MANOMETRY (EM);  Surgeon: Albertus Gordy HERO, MD;  Location: [facility];  Service: Gastroenterology;  Laterality: N/A;   EYE SURGERY Right    tear duct repair   NASAL SINUS SURGERY     TONSILLECTOMY     TRIGGER FINGER RELEASE Right 11/06/2017   Procedure: RELEASE TRIGGER FINGER/A-1 PULLEY RIGHT INDEX FINGER AND RIGHT MIDDLE FINGER;  Surgeon: Curia Franky, MD;  Location: [facility];  Service: Orthopedics;  Laterality: Right;   UPPER GASTROINTESTINAL ENDOSCOPY  10/18/2013   with dilation; with Propofol    UPPER GASTROINTESTINAL ENDOSCOPY  10/30/2016  is allergic to molds & smuts, exenatide, and sulfa antibiotics.   Family History  Problem Relation Age of Onset   Allergic rhinitis Mother        mets- unknown source   Peripheral vascular disease Mother 11   Anesthesia problems Mother        post-op N/V; hard to wake up post-op   Cancer Mother    Diabetes Father    CAD Father 58   AAA (abdominal aortic  aneurysm) Father    Allergic rhinitis Sister    Diabetes Sister        type 1   Stroke Sister        x 2   AAA (abdominal aortic aneurysm) Brother    Colon cancer Maternal Grandmother    Stomach cancer Maternal Grandfather    Prostate cancer Paternal Grandfather    Inflammatory bowel disease Grandchild    Esophageal cancer Neg Hx    Rectal cancer Neg Hx    Uterine cancer Neg Hx    Bladder Cancer Neg Hx    Renal cancer Neg Hx     Social History   Tobacco Use   Smoking status: Never    Passive exposure: Past   Smokeless tobacco: Never  Vaping Use   Vaping status: Never Used  Substance Use Topics   Alcohol  use: Yes    Comment: rarely   Drug use: Never     Review of Systems was negative for a full 10 system review except as noted in the History of Present Illness.   Current Outpatient Medications:    ALPRAZolam (XANAX) 0.25 MG tablet, TAKE 1 TABLET BY MOUTH ONCE DAILY AS NEEDED FOR ANXIETY OR SLEEP, Disp: , Rfl: 0   aspirin 81 MG tablet, Take 81 mg by mouth daily., Disp: , Rfl:    azelastine  (ASTELIN ) 0.1 % nasal spray, Place 2 sprays into both nostrils 2 (two) times daily as needed for rhinitis. Use in each nostril as directed, Disp: 30 mL, Rfl: 5   calcium carbonate (OS-CAL) 600 MG TABS tablet, Take 500 mg by mouth 2 (two) times daily with a meal., Disp: , Rfl:    celecoxib (CELEBREX) 200 MG capsule, Take 200 mg by mouth daily., Disp: , Rfl:    Cetirizine HCl 10 MG CAPS, Take 10 mg by mouth as needed., Disp: , Rfl:    Continuous Glucose Sensor (FREESTYLE LIBRE 3 SENSOR) MISC, Inject into the skin daily in the afternoon., Disp: , Rfl:    diltiazem  (CARDIZEM  LA) 240 MG 24 hr tablet, Take 1 tablet by mouth once daily, Disp: 30 tablet, Rfl: 4   empagliflozin (JARDIANCE) 25 MG TABS tablet, Take 25 mg by mouth daily., Disp: , Rfl:    estradiol  (ESTRACE ) 0.01 % CREA vaginal cream, PLACE 0.5 G VAGINALLY 2 (TWO) TIMES A WEEK. PLACE 0.5 G NIGHTLY FOR TWO WEEKS THEN TWICE A WEEK  AFTER, Disp: 43 g, Rfl: 2   Ferrous Sulfate (IRON SLOW RELEASE) 140 (45 Fe) MG TBCR, Take 1 tablet by mouth daily., Disp: , Rfl:    fluticasone  (FLONASE ) 50 MCG/ACT nasal spray, Place 1 spray into both nostrils in the morning and at bedtime., Disp: 16 g, Rfl: 5   furosemide (LASIX) 20 MG tablet, furosemide 20 mg tablet, Disp: , Rfl:    Glucagon (GVOKE HYPOPEN 2-PACK) 1 MG/0.2ML SOAJ, as directed Subcutaneous for severe hypoglycemia, Disp: , Rfl:    insulin glargine (LANTUS) 100 UNIT/ML injection, Inject 17 Units into the skin daily., Disp: ,  Rfl:    isosorbide mononitrate (IMDUR) 30 MG 24 hr tablet, Take 15 mg by mouth daily., Disp: , Rfl:    levocetirizine (XYZAL ) 5 MG tablet, TAKE 1 TABLET BY MOUTH ONCE DAILY IN THE EVENING, Disp: 32 tablet, Rfl: 0   metFORMIN (GLUCOPHAGE) 1000 MG tablet, Take 1,000 mg by mouth 2 (two) times daily with a meal. , Disp: , Rfl:    Multiple Vitamin (MULTIVITAMIN) tablet, Take 1 tablet by mouth daily., Disp: , Rfl:    nitrofurantoin, macrocrystal-monohydrate, (MACROBID) 100 MG capsule, Take 1 capsule (100 mg total) by mouth 2 (two) times daily for 5 days., Disp: 10 capsule, Rfl: 0   ramipril (ALTACE) 10 MG tablet, Take 5 mg by mouth daily., Disp: , Rfl:    rosuvastatin (CRESTOR) 10 MG tablet, Take 10 mg by mouth daily., Disp: , Rfl:    traMADol (ULTRAM) 50 MG tablet, Take 50 mg by mouth every 6 (six) hours as needed., Disp: , Rfl:    TRULICITY 4.5 MG/0.5ML SOPN, Inject 4.5 mg into the skin once a week. Tuesday, Disp: , Rfl:    venlafaxine XR (EFFEXOR-XR) 37.5 MG 24 hr capsule, Take 1 capsule by mouth. DAILY, Disp: , Rfl:    Vibegron  (GEMTESA ) 75 MG TABS, Take 1 tablet (75 mg total) by mouth daily., Disp: 14 tablet, Rfl:    Vonoprazan Fumarate (VOQUEZNA) 20 MG TABS, Take 1 tablet by mouth daily., Disp: 30 tablet, Rfl: 5  Current Facility-Administered Medications:    dexamethasone  (DECADRON ) injection 2 mg, 2 mg, Other, Once, Gaynel Nest L, DPM    Objective Vitals:   08/05/24 0930  BP: 110/62  Pulse: (!) 55    Gen: NAD CV: S1 S2 RRR Lungs: Clear to auscultation bilaterally Abd: soft, nontender  Straight Catheterization Procedure for PVR: After verbal consent was obtained from the patient for catheterization to assess bladder emptying and residual volume the urethra and surrounding tissues were prepped with betadine and an in and out catheterization was performed.  PVR was 80mL.  Urine appeared clear yellow. The patient tolerated the procedure well.   POP-Q  2                                            Aa   2                                           Ba  -4                                              C   3                                            Gh  3                                            Pb  7  tvl   -2                                            Ap  -2                                            Bp  -5                                              D   Pelvic Exam: Normal external female genitalia; Bartholin's and Skene's glands normal in appearance; urethral meatus normal in appearance, no urethral masses or discharge. Speculum exam revealed no lesions in the vagina. The #3 incontinence dish pessary was replaced. It was comfortable to the patient and fit well.   Lab Results  Component Value Date   COLORU yellow 04/08/2024   CLARITYU clear 04/08/2024   GLUCOSEUR =500 (A) 04/08/2024   BILIRUBINUR negative 04/08/2024   SPECGRAV 1.010 04/08/2024   RBCUR negative 04/08/2024   PHUR 5.5 04/08/2024   UROBILINOGEN 0.2 04/08/2024   LEUKOCYTESUR Negative 04/08/2024   Assessment/ Plan  Assessment: The patient is a 73 y.o. year old scheduled to undergo sacrospinous ligament hysteropexy, anterior/posterior repair, perineorrhaphy, urethral bulking, cystoscopy. Verbal consent was obtained for these procedures.  Urinary incontinence, mixed Assessment & Plan: -  04/08/24 POCT UA + glucose only due to T2DM, PVR 16mL - worsened after ring with support pessary use, stress > urgency. Unchanged with incontinence pessary - For treatment of stress urinary incontinence,  non-surgical options include expectant management, weight loss, physical therapy, as well as a pessary.  Surgical options include a midurethral sling, Burch urethropexy, and transurethral injection of a bulking agent. - replaced size 3 incontinence dish pessary until surgery - reviewed urethral bulking (Bulkamid). We discussed success rate of approximately 70-80% and possible need for second injection. We reviewed that this is not a permanent procedure and the Bulkamid does become less effective over time. Risks reviewed including injury to bladder or urethra, UTI, urinary retention and hematuria.  Sling: The effectiveness of a midurethral vaginal mesh sling is approximately 85%, and thus, there will be times when you may leak urine after surgery, especially if your bladder is full or if you have a strong cough. There is a balance between making the sling tight enough to treat your leakage but not too tight so that you have long-term difficulty emptying your bladder. A mesh sling will not directly treat overactive bladder/urge incontinence and may worsen it.  There is an FDA safety notification on vaginal mesh procedures for prolapse but NOT mesh slings. We have extensive experience and training with mesh placement and we have close postoperative follow up to identify any potential complications from mesh. It is important to realize that this mesh is a permanent implant that cannot be easily removed. There are rare risks of mesh exposure (2-4%), pain with intercourse (0-7%), and infection (<1%). The risk of mesh exposure if more likely in a woman with risks for poor healing (prior radiation, poorly controlled diabetes, or immunocompromised). The risk of new or worsened chronic pain after mesh implant is more  common in  women with baseline chronic pain and/or poorly controlled anxiety or depression. Approximately 2-4% of patients will experience longer-term post-operative voiding dysfunction that may require surgical revision of the sling. We also reviewed that postoperatively, her stream may not be as strong as before surgery.  - pt desires to proceed with urethral bulking - We discussed the symptoms of overactive bladder (OAB), which include urinary urgency, urinary frequency, nocturia, with or without urge incontinence.  While we do not know the exact etiology of OAB, several treatment options exist. We discussed management including behavioral therapy (decreasing bladder irritants, urge suppression strategies, timed voids, bladder retraining), physical therapy, medication; for refractory cases posterior tibial nerve stimulation, sacral neuromodulation, and intravesical botulinum toxin injection.  For anticholinergic medications, we discussed the potential side effects of anticholinergics including dry eyes, dry mouth, constipation, cognitive impairment and urinary retention. For Beta-3 agonist medication, we discussed the potential side effect of elevated blood pressure which is more likely to occur in individuals with uncontrolled hypertension. - samples of Gemtesa  with relief, avoid anti-cholinergics due to dry mouth and constipation. Will reassess postop to avoid exacerbation of postoperative constipation, discussed possible need for additional treatment postop - discussed fluid management and reduction of caffeine use - encouraged and reviewed Kegel exercises  Orders: -     Ambulatory Referral For Surgery Scheduling -     POCT urinalysis dipstick  Pelvic organ prolapse quantification stage 3 cystocele Assessment & Plan: - size #3 ring with support pessary in place since 12/09/23, declines self management. Size 3 incontinence dish pessary replacement per pt request until surgery - For treatment of  pelvic organ prolapse, we discussed options for management including expectant management, conservative management, and surgical management, such as Kegels, a pessary, pelvic floor physical therapy, and specific surgical procedures. - size 3 ring with support pessary with relief, worsened SUI symptoms and changed to #3 incontinence dish - reviewed risks and benefits of uterine preservation. Denies history of AUB/PMB or LEEP/CKC. Through joint decision making, pt desires to proceed with uterine preservation - We discussed 3 options for prolapse repair:  1) vaginal repair without mesh - Pros - safer, no mesh complications - Cons - not as strong as mesh repair, higher risk of recurrence  2) laparoscopic repair with mesh - Pros - stronger, better long-term success - Cons - risks of mesh implant (erosion into vagina or bladder, adhering to the rectum, pain) - these risks are lower than with a vaginal mesh but still exist  3) Vaginal closure procedure - pros - strong, good long-term success - cons - unable to have penetrative intercourse - desires to proceed with sacrospinous ligament hysteropexy, anterior/posterior repair, perineorrhaphy  Orders: -     Ambulatory Referral For Surgery Scheduling  Dysuria Assessment & Plan: - intermittently for 4 weeks - denies hematuria, frequency/urgency or increased leakage - Rx macrobid for presumed UTI due to pending surgery - pending UA culture  Orders: -     Nitrofurantoin Monohyd Macro; Take 1 capsule (100 mg total) by mouth 2 (two) times daily for 5 days.  Dispense: 10 capsule; Refill: 0 -     POCT urinalysis dipstick  Constipation, unspecified constipation type Assessment & Plan: - For constipation, we reviewed the importance of a better bowel regimen.  We also discussed the importance of avoiding chronic straining, as it can exacerbate her pelvic floor symptoms; we discussed treating constipation and straining prior to surgery, as postoperative  straining can lead to damage to the repair and recurrence of symptoms.  We discussed initiating therapy with increasing fluid intake, fiber supplementation, stool softeners, and laxatives such as miralax.  - encouraged squatting position for defecation and continue to titration miralax or fiber supplementation due to type I stool 2-3x/day. Discussed bowel regimen postop to avoid straining   Incontinence of feces, unspecified fecal incontinence type Assessment & Plan: - history of IBS-D trigger by stress, recently with constipation managed with colace - Treatment options include anti-diarrhea medication (loperamide/ Imodium OTC or prescription lomotil), fiber supplements, physical therapy, and possible sacral neuromodulation or surgery.   - denies blood in stool, unintended weight loss - encouraged Kegel exercises, caffeine reduction, titration of fiber supplementation and squatting position for defecation - We discussed the fact that surgery for the rectocele may address bowel leakage but we cannot predict how much it may improve. She will still need to work on keeping a good stool consistency (well-formed, not hard or loose).   Vaginal atrophy Assessment & Plan: - prior superficial abrasion with bleeding with pessary removal - For symptomatic vaginal atrophy options include lubrication with a water-based lubricant, personal hygiene measures and barrier protection against wetness, and estrogen replacement in the form of vaginal cream, vaginal tablets, or a time-released vaginal ring.   - reduce low dose vaginal atrophy to 1g twice a week      Plan: General Surgical Consent: The patient has previously been counseled on alternative treatments, and the decision by the patient and provider was to proceed with the procedure listed above.  For all procedures, there are risks of bleeding, infection, damage to surrounding organs including but not limited to bowel, bladder, blood vessels, ureters  and nerves, and need for further surgery if an injury were to occur. These risks are all low with minimally invasive surgery.   There are risks of numbness and weakness at any body site or buttock/rectal pain.  It is possible that baseline pain can be worsened by surgery, either with or without mesh. If surgery is vaginal, there is also a low risk of possible conversion to laparoscopy or open abdominal incision where indicated. Very rare risks include blood transfusion, blood clot, heart attack, pneumonia, or death.   There is also a risk of short-term postoperative urinary retention with need to use a catheter. About half of patients need to go home from surgery with a catheter, which is then later removed in the office. The risk of long-term need for a catheter is very low. There is also a risk of worsening of overactive bladder.   Prolapse (with or without mesh): Risk factors for surgical failure  include things that put pressure on your pelvis and the surgical repair, including obesity, chronic cough, and heavy lifting or straining (including lifting children or adults, straining on the toilet, or lifting heavy objects such as furniture or anything weighing >25 lbs. Risks of recurrence is 20-30% with vaginal native tissue repair and a less than 10% with sacrocolpopexy with mesh.    We discussed consent for blood products. Risks for blood transfusion include allergic reactions, other reactions that can affect different body organs and managed accordingly, transmission of infectious diseases such as HIV or Hepatitis. However, the blood is screened. Patient consents for blood products.  Pre-operative instructions:  She was instructed to not take Aspirin/NSAIDs x 7days prior to surgery. She may continue her 81mg  ASA. Antibiotic prophylaxis was ordered as indicated.  Catheter use: Patient will go home with foley if needed after post-operative voiding trial.  Post-operative instructions:  She was  provided with specific post-operative instructions, including precautions and signs/symptoms for which we would recommend contacting us , in addition to daytime and after-hours contact phone numbers. This was provided on a handout.   Post-operative medications: Prescriptions for motrin, tylenol , miralax, and oxycodone  were sent to her pharmacy. Discussed using ibuprofen and tylenol  on a schedule to limit use of narcotics.   Laboratory testing:  We will check labs: per anesthesia. Day of surgery UPT n/a  Preoperative clearance:  She underwent surgical clearance by PCP.   - T2DM: Recommended to hold Trulicity before surgery and resume 1 week post surgery, discussed need to continue to monitor glycemic control and adjust medications as needed postop if persistently > 200.  - Hold supplements 1 week preop  Post-operative follow-up:  A post-operative appointment will be made for 6 weeks from the date of surgery. If she needs a post-operative nurse visit for a voiding trial, that will be set up after she leaves the hospital.    Patient will call the clinic or use MyChart should anything change or any new issues arise.  Time spent: I spent 61 minutes dedicated to the care of this patient on the date of this encounter to include pre-visit review of records, face-to-face time with the patient discussing stage III pelvic organ prolapse, mixed urinary incontinence, fecal incontinence, nocturia, and post visit documentation and ordering medication/ testing.   Lianne ONEIDA Gillis, MD

## 2024-08-04 NOTE — Assessment & Plan Note (Signed)
-   size #3 ring with support pessary in place since 12/09/23, declines self management. Size 3 incontinence dish pessary replacement per pt request until surgery - For treatment of pelvic organ prolapse, we discussed options for management including expectant management, conservative management, and surgical management, such as Kegels, a pessary, pelvic floor physical therapy, and specific surgical procedures. - size 3 ring with support pessary with relief, worsened SUI symptoms and changed to #3 incontinence dish - reviewed risks and benefits of uterine preservation. Denies history of AUB/PMB or LEEP/CKC. Through joint decision making, pt desires to proceed with uterine preservation - We discussed 3 options for prolapse repair:  1) vaginal repair without mesh - Pros - safer, no mesh complications - Cons - not as strong as mesh repair, higher risk of recurrence  2) laparoscopic repair with mesh - Pros - stronger, better long-term success - Cons - risks of mesh implant (erosion into vagina or bladder, adhering to the rectum, pain) - these risks are lower than with a vaginal mesh but still exist  3) Vaginal closure procedure - pros - strong, good long-term success - cons - unable to have penetrative intercourse - desires to proceed with sacrospinous ligament hysteropexy, anterior/posterior repair, perineorrhaphy

## 2024-08-04 NOTE — Assessment & Plan Note (Signed)
-   history of IBS-D trigger by stress, recently with constipation managed with colace - Treatment options include anti-diarrhea medication (loperamide/ Imodium OTC or prescription lomotil), fiber supplements, physical therapy, and possible sacral neuromodulation or surgery.   - denies blood in stool, unintended weight loss - encouraged Kegel exercises, caffeine reduction, titration of fiber supplementation and squatting position for defecation - We discussed the fact that surgery for the rectocele may address bowel leakage but we cannot predict how much it may improve. She will still need to work on keeping a good stool consistency (well-formed, not hard or loose).

## 2024-08-04 NOTE — Assessment & Plan Note (Signed)
-   04/08/24 POCT UA + glucose only due to T2DM, PVR 16mL - worsened after ring with support pessary use, stress > urgency. Unchanged with incontinence pessary - For treatment of stress urinary incontinence,  non-surgical options include expectant management, weight loss, physical therapy, as well as a pessary.  Surgical options include a midurethral sling, Burch urethropexy, and transurethral injection of a bulking agent. - replaced size 3 incontinence dish pessary until surgery - reviewed urethral bulking (Bulkamid). We discussed success rate of approximately 70-80% and possible need for second injection. We reviewed that this is not a permanent procedure and the Bulkamid does become less effective over time. Risks reviewed including injury to bladder or urethra, UTI, urinary retention and hematuria.  Sling: The effectiveness of a midurethral vaginal mesh sling is approximately 85%, and thus, there will be times when you may leak urine after surgery, especially if your bladder is full or if you have a strong cough. There is a balance between making the sling tight enough to treat your leakage but not too tight so that you have long-term difficulty emptying your bladder. A mesh sling will not directly treat overactive bladder/urge incontinence and may worsen it.  There is an FDA safety notification on vaginal mesh procedures for prolapse but NOT mesh slings. We have extensive experience and training with mesh placement and we have close postoperative follow up to identify any potential complications from mesh. It is important to realize that this mesh is a permanent implant that cannot be easily removed. There are rare risks of mesh exposure (2-4%), pain with intercourse (0-7%), and infection (<1%). The risk of mesh exposure if more likely in a woman with risks for poor healing (prior radiation, poorly controlled diabetes, or immunocompromised). The risk of new or worsened chronic pain after mesh implant is more  common in women with baseline chronic pain and/or poorly controlled anxiety or depression. Approximately 2-4% of patients will experience longer-term post-operative voiding dysfunction that may require surgical revision of the sling. We also reviewed that postoperatively, her stream may not be as strong as before surgery.  - pt desires to proceed with urethral bulking - We discussed the symptoms of overactive bladder (OAB), which include urinary urgency, urinary frequency, nocturia, with or without urge incontinence.  While we do not know the exact etiology of OAB, several treatment options exist. We discussed management including behavioral therapy (decreasing bladder irritants, urge suppression strategies, timed voids, bladder retraining), physical therapy, medication; for refractory cases posterior tibial nerve stimulation, sacral neuromodulation, and intravesical botulinum toxin injection.  For anticholinergic medications, we discussed the potential side effects of anticholinergics including dry eyes, dry mouth, constipation, cognitive impairment and urinary retention. For Beta-3 agonist medication, we discussed the potential side effect of elevated blood pressure which is more likely to occur in individuals with uncontrolled hypertension. - samples of Gemtesa  with relief, avoid anti-cholinergics due to dry mouth and constipation. Will reassess postop to avoid exacerbation of postoperative constipation, discussed possible need for additional treatment postop - discussed fluid management and reduction of caffeine use - encouraged and reviewed Kegel exercises

## 2024-08-04 NOTE — Assessment & Plan Note (Signed)
-   prior superficial abrasion with bleeding with pessary removal - For symptomatic vaginal atrophy options include lubrication with a water-based lubricant, personal hygiene measures and barrier protection against wetness, and estrogen replacement in the form of vaginal cream, vaginal tablets, or a time-released vaginal ring.   - reduce low dose vaginal atrophy to 1g twice a week

## 2024-08-04 NOTE — Assessment & Plan Note (Signed)
-   For constipation, we reviewed the importance of a better bowel regimen.  We also discussed the importance of avoiding chronic straining, as it can exacerbate her pelvic floor symptoms; we discussed treating constipation and straining prior to surgery, as postoperative straining can lead to damage to the repair and recurrence of symptoms. We discussed initiating therapy with increasing fluid intake, fiber supplementation, stool softeners, and laxatives such as miralax.  - encouraged squatting position for defecation and continue to titration miralax or fiber supplementation due to type I stool 2-3x/day. Discussed bowel regimen postop to avoid straining

## 2024-08-04 NOTE — Patient Instructions (Addendum)
 Reduce vaginal estrogen 1g twice a week.   Continue to increase your miralax dosing prior to surgery.  Start macrobid for presumed UTI.  Please call if you continue to experience persistent or worsening urinary symptoms such as fever > 100.4, nausea/vomiting, one sided back pain or blood in your urine.   Stop aspirin 1 week prior to your surgery.   Continue to hold Lasix.

## 2024-08-04 NOTE — Assessment & Plan Note (Signed)
-   intermittently for 4 weeks - denies hematuria, frequency/urgency or increased leakage - Rx macrobid for presumed UTI due to pending surgery - pending UA culture

## 2024-08-06 ENCOUNTER — Telehealth: Payer: Self-pay

## 2024-08-06 NOTE — Telephone Encounter (Signed)
 Patient called stating she had a pessary placed by Dr. Guadlupe but the pessary has now popped out and she want to know if she needs to get it placed back in or can she leave it out since surgery date is 11/19.

## 2024-08-10 ENCOUNTER — Telehealth: Payer: Self-pay | Admitting: Cardiology

## 2024-08-10 NOTE — Telephone Encounter (Signed)
   Pre-operative Risk Assessment    Patient Name: Adrienne [name]  DOB: 1951/03/23 MRN: [id number]      Request for Surgical Clearance    Procedure:  Pelvic Organ prolaspse and Stress Urinary incompanse  Date of Surgery:  Clearance 08/18/24                                 Surgeon: Dr. Guadlupe  Surgeon's Group or Practice Name: Urogynecology  Phone number: [phone] Fax number: [phone]   Type of Clearance Requested:   - Medical  - Pharmacy:  Hold        Type of Anesthesia:  General    Additional requests/questions:  Please advise surgeon/provider what medications should be held.  SignedWillie Daring   08/10/2024, 2:22 PM

## 2024-08-10 NOTE — Telephone Encounter (Signed)
   Name: Adrienne [name]  DOB: [birth year]  MRN: [id number]  Primary Cardiologist: None  Chart reviewed as part of pre-operative protocol coverage. Because of Adrienne [name] past medical history and time since last visit, she will require a follow-up in-office visit in order to better assess preoperative cardiovascular risk.  Pre-op covering staff: - Please schedule appointment and call patient to inform them. If patient already had an upcoming appointment within acceptable timeframe, please add pre-op clearance to the appointment notes so provider is aware. - Please contact requesting surgeon's office via preferred method (i.e, phone, fax) to inform them of need for appointment prior to surgery.   Barnie Hila, NP  08/10/2024, 2:32 PM

## 2024-08-10 NOTE — Telephone Encounter (Signed)
 S/W pt and scheduled IN OFFICE preop appt 08/16/24 with Rosaline Bane, NP   Will update the surgeons office

## 2024-08-13 ENCOUNTER — Other Ambulatory Visit: Payer: Self-pay | Admitting: Obstetrics

## 2024-08-13 ENCOUNTER — Telehealth: Payer: Self-pay

## 2024-08-13 DIAGNOSIS — G8918 Other acute postprocedural pain: Secondary | ICD-10-CM

## 2024-08-13 MED ORDER — IBUPROFEN 600 MG PO TABS: 600.0000 mg | ORAL_TABLET | Freq: Four times a day (QID) | ORAL | 0 refills | Status: AC | PRN

## 2024-08-13 MED ORDER — ACETAMINOPHEN 500 MG PO TABS: 500.0000 mg | ORAL_TABLET | Freq: Four times a day (QID) | ORAL | 0 refills | Status: AC | PRN

## 2024-08-13 NOTE — Telephone Encounter (Signed)
 Patient called wanting to know when her medication for surgery will be sent in to her pharmacy.

## 2024-08-15 NOTE — Progress Notes (Signed)
 " Cardiology Office Note   Date:  08/16/2024  ID:  Adrienne [name], DOB [birth year], MRN [id number] PCP: Vernadine Charlie ORN, MD  [facility] Providers Cardiologist:  None     PMH Coronary artery calcification on CT Aortic atherosclerosis Hyperlipidemia Hypertension Insulin dependent diabetes Carotid bruit  Seen by Dr. Lavona 02/12/2023 for elevation of coronary artery calcium.  She had been seen in 2017 with nonspecific changes on her EKG and had a negative ETT.  She also had unremarkable echo.  She had a CT to evaluate for swallowing issues and esophageal problems.  CT demonstrated aortic atherosclerosis and coronary calcium.  She reported some chest discomfort but had not participated in regular activity, primarily doing household chores and taking care of her husband who is disabled.  Carotid duplex was obtained due to carotid bruit and revealed only minimal bilateral thickening or plaque.  Primary risk reduction was pursued for coronary calcification.  LDL was 116, A1c 6.3, BP at goal.  No changes were made to medical therapy and she was advised to follow-up as needed.  History of Present Illness Discussed the use of AI scribe software for clinical note transcription with the patient, who gave verbal consent to proceed.  History of Present Illness Adrienne [name] is a very pleasant 73 year old female who presents for a cardiology consultation prior to surgery. She experiences occasional chest discomfort which she states she is on clear whether it is her esophagus or heart related. No associated symptoms such as shortness of breath,  n/v, palpitations, diaphoresis. She is active with exercise classes 4 days a week as well as taking care of her husband and sister who both have disabilities.  She denies worsening chest pain, dyspnea, palpitations, or other symptoms concerning for angina with increased exertion.  She is on isosorbide mononitrate and diltiazem  for her esophageal  condition. She experiences balance issues, which have led to a recent fall, but she was able to catch herself before hitting the ground.  She participates in therapy and exercise classes but does not walk with an assistive device.  Generally starts to feel off balance while walking, no leg weakness, or dizziness. She manages diabetes with insulin, with occasional hypoglycemic episodes, with last A1c was 6.4%. She experiences hypotension at times and takes ramipril for blood pressure management.  Her diltiazem  was recently increased for management of esophageal discomfort.   ROS: See HPI  Studies Reviewed EKG Interpretation Date/Time:  Monday August 16 2024 08:49:10 EST Ventricular Rate:  67 PR Interval:  130 QRS Duration:  84 QT Interval:  410 QTC Calculation: 433 R Axis:   57  Text Interpretation: Normal sinus rhythm Nonspecific T wave abnormality When compared with ECG of 30-Apr-2020 14:56, PREVIOUS ECG IS PRESENT Confirmed by Percy Browning [phone]) on 08/16/2024 8:59:17 AM     No results found for: LIPOA  Risk Assessment/Calculations           Physical Exam VS:  BP 96/62 (BP Location: Right Arm, Patient Position: Sitting, Cuff Size: Normal)   Pulse 67   Ht 5' (1.524 m)   Wt 129 lb (58.5 kg)   SpO2 99%   BMI 25.19 kg/m    Wt Readings from Last 3 Encounters:  08/16/24 129 lb (58.5 kg)  08/05/24 129 lb (58.5 kg)  06/30/24 129 lb (58.5 kg)    GEN: Well nourished, well developed in no acute distress NECK: No JVD; No carotid bruits CARDIAC: RRR, no murmurs, rubs, gallops RESPIRATORY:  Clear  to auscultation without rales, wheezing or rhonchi  ABDOMEN: Soft, non-tender, non-distended EXTREMITIES:  No edema; No deformity   Assessment & Plan Preoperative cardiac evaluation According to the Revised Cardiac Risk Index (RCRI), her Perioperative Risk of Major Cardiac Event is (%): 0.9. Her Functional Capacity in METs is: 7.04 according to the Duke Activity Status Index  (DASI). The patient is doing well from a cardiac perspective. Therefore, based on ACC/AHA guidelines, the patient would be at acceptable risk for the planned procedure without further cardiovascular testing. Per office protocol, she may hold aspirin for 7 days prior to procedure and should resume as soon as hemodynamically stable postoperatively.  I will forward clearance to requesting provider.  Coronary artery disease  Hyperlipidemia LDL goal < 70 Chest pain Referred to cardiology for evidence of coronary artery calcification and aortic atherosclerosis on CT.  Prior ETT, AAA duplex and echo 2017 was negative, no acute concerns on echo, and mild bilateral iliac stenosis, no AAA on duplex. She has occasional chest pain that does not worsen with exertion that she attributes to esophageal issues.  She is on Imdur and diltiazem  for management of esophageal spasm.  She is active with exercise classes IV days a week as well as primary caregiver for her husband and sister. She denies chest pain, dyspnea, or other symptoms concerning for angina.  No acute concern on EKG today.  No indication for further ischemic evaluation at this time.  Lipid panel completed 12/23/2023 with PCP with LDL 53, triglycerides 94, HDL 63, lipids well-controlled. BP is well controlled.  -Report any changes in chest pain, especially with exertion - Restart aspirin post-surgery - Continue rosuvastatin, ramipril, Imdur, diltiazem   Hypotension   Gait instability Reports BP is soft at times. Also has issues with gait instability.  She is careful with position changes and with walking.  She continues to be active with regular exercise and caregiving with no significant concerns and no presyncope or syncope.  Understands that hypotension is likely caused by ramipril, Imdur and diltiazem , but continues these medications primarily for management of esophageal spasm. No change in anti-hypertensive therapy today.  - Continue to monitor blood  pressure regularly  - Management per GI and PCP  Type 2 diabetes mellitus, on insulin A1C of 6.4% on 03/17/2024.  She is on insulin for management of diabetes and reports occasional hypoglycemia.  No history of passing out or coma.  She monitors blood sugar closely.  No acute concerns today. - Continue current regimen for management of T2DM - Monitor blood sugar levels regularly        Disposition: 1 year with Dr. Lavona or APP  Signed, Rosaline Bane, NP-C "

## 2024-08-16 ENCOUNTER — Ambulatory Visit (HOSPITAL_BASED_OUTPATIENT_CLINIC_OR_DEPARTMENT_OTHER): Admitting: Nurse Practitioner

## 2024-08-16 ENCOUNTER — Encounter (HOSPITAL_BASED_OUTPATIENT_CLINIC_OR_DEPARTMENT_OTHER): Payer: Self-pay | Admitting: Nurse Practitioner

## 2024-08-16 ENCOUNTER — Encounter (HOSPITAL_COMMUNITY): Payer: Self-pay | Admitting: Obstetrics

## 2024-08-16 VITALS — BP 96/62 | HR 67 | Ht 60.0 in | Wt 129.0 lb

## 2024-08-16 DIAGNOSIS — Z01810 Encounter for preprocedural cardiovascular examination: Secondary | ICD-10-CM

## 2024-08-16 DIAGNOSIS — Z794 Long term (current) use of insulin: Secondary | ICD-10-CM

## 2024-08-16 DIAGNOSIS — R072 Precordial pain: Secondary | ICD-10-CM

## 2024-08-16 DIAGNOSIS — I2510 Atherosclerotic heart disease of native coronary artery without angina pectoris: Secondary | ICD-10-CM | POA: Diagnosis not present

## 2024-08-16 DIAGNOSIS — R2681 Unsteadiness on feet: Secondary | ICD-10-CM

## 2024-08-16 DIAGNOSIS — I952 Hypotension due to drugs: Secondary | ICD-10-CM

## 2024-08-16 DIAGNOSIS — E11649 Type 2 diabetes mellitus with hypoglycemia without coma: Secondary | ICD-10-CM | POA: Diagnosis not present

## 2024-08-16 DIAGNOSIS — E785 Hyperlipidemia, unspecified: Secondary | ICD-10-CM | POA: Diagnosis not present

## 2024-08-16 NOTE — Patient Instructions (Signed)
 Medication Instructions:   Your physician recommends that you continue on your current medications as directed. Please refer to the Current Medication list given to you today.   *If you need a refill on your cardiac medications before your next appointment, please call your pharmacy*  Lab Work:  None ordered.  If you have labs (blood work) drawn today and your tests are completely normal, you will receive your results only by: MyChart Message (if you have MyChart) OR A paper copy in the mail If you have any lab test that is abnormal or we need to change your treatment, we will call you to review the results.  Testing/Procedures:  None ordered.  Follow-Up: At [facility], you and your health needs are our priority.  As part of our continuing mission to provide you with exceptional heart care, our providers are all part of one team.  This team includes your primary Cardiologist (physician) and Advanced Practice Providers or APPs (Physician Assistants and Nurse Practitioners) who all work together to provide you with the care you need, when you need it.  Your next appointment:   1 year(s)  Provider:   Ethan Schilling, MD   We recommend signing up for the patient portal called MyChart.  Sign up information is provided on this After Visit Summary.  MyChart is used to connect with patients for Virtual Visits (Telemedicine).  Patients are able to view lab/test results, encounter notes, upcoming appointments, etc.  Non-urgent messages can be sent to your provider as well.   To learn more about what you can do with MyChart, go to [email or web address].   Other Instructions  Your physician wants you to follow-up in: 1 year.  You will receive a reminder letter in the mail two months in advance. If you don't receive a letter, please call our office to schedule the follow-up appointment.

## 2024-08-16 NOTE — Progress Notes (Signed)
 Cardiac clearance dated 08/16/24 by EMERSON Bane, NP in Epic.    Spoke w/ via phone for pre-op interview--- Adrienne [name] needs dos----  Surgeon orders requested 08/16/24. BMP, CBG and A1C per anesthesia.      [name] results------Current EKG in Epic dated 08/16/24 COVID test -----patient states asymptomatic no test needed Arrive at -------0630 NPO after MN NO Solid Food.  Pre-Surgery Ensure or G2:  Med rec completed Medications to take morning of surgery ----- Xanax-PRN, Cardizem , Effexor, and Voquenza Diabetic medication -----NONE AM of surgery. Hold Jardiance from today until after surgery.  GLP1 agonist last dose: Trulicity weekly injections. Last dose 08/09/24. GLP1 instructions: verbalized understanding to hold any further doses until after surgery.  Patient instructed no nail polish to be worn day of surgery Patient instructed to bring photo id and insurance card day of surgery Patient aware to have Driver (ride ) / caregiver    for 24 hours after surgery - Daughter Adrienne [name] Patient Special Instructions ----- Bring extra supplies for [name] in case needed. Pre-Op special Instructions -----  Patient verbalized understanding of instructions that were given at this phone interview. Patient denies chest pain, sob, fever, cough at the interview.

## 2024-08-17 ENCOUNTER — Other Ambulatory Visit: Payer: Self-pay | Admitting: Obstetrics

## 2024-08-17 DIAGNOSIS — G8918 Other acute postprocedural pain: Secondary | ICD-10-CM

## 2024-08-17 MED ORDER — OXYCODONE HCL 5 MG PO TABS: 5.0000 mg | ORAL_TABLET | ORAL | 0 refills | Status: AC | PRN

## 2024-08-17 NOTE — Progress Notes (Signed)
 Rx oxycodone  sent to [facility] pharmacy

## 2024-08-17 NOTE — Progress Notes (Signed)
------------------------------------------  [facility] PROCEDURAL EXPEDITER NOTE-------------------------------------------------  Patient Name: Adrienne [name] Patient DOB: 01-18-1951 Today's Date: @TODAY @   Chart reviewed:  Yes  Documentation gaps: n/a Orders in place:  Yes  Communication with surgical team if no orders: n/s Labs, test, and orders reviewed: yes Requires surgical clearance:  Yes What type of clearance: cardiac clearance on chart 08/16/2024 Clearance received: cardiac clearance Patient status:pre op   Barriers noted:n/a  Intervention provided by [facility] team: n/a Barrier resolved:  Yes   Ronal Bald, RN [employer]

## 2024-08-17 NOTE — H&P (View-Only) (Signed)
 Rx oxycodone  sent to [facility] pharmacy

## 2024-08-18 ENCOUNTER — Encounter (HOSPITAL_COMMUNITY)
Admission: RE | Disposition: A | Discharge status: Home / Self Care | Payer: Self-pay | Source: Home / Self Care | Attending: Obstetrics

## 2024-08-18 ENCOUNTER — Inpatient Hospital Stay (HOSPITAL_COMMUNITY): Payer: Self-pay

## 2024-08-18 ENCOUNTER — Inpatient Hospital Stay (HOSPITAL_COMMUNITY)
Admission: RE | Admit: 2024-08-18 | Discharge: 2024-08-18 | DRG: 747 | Disposition: A | Discharge status: Home / Self Care | Attending: Obstetrics | Admitting: Obstetrics

## 2024-08-18 ENCOUNTER — Encounter (HOSPITAL_COMMUNITY): Payer: Self-pay | Admitting: Obstetrics

## 2024-08-18 ENCOUNTER — Other Ambulatory Visit: Payer: Self-pay

## 2024-08-18 DIAGNOSIS — I10 Essential (primary) hypertension: Secondary | ICD-10-CM | POA: Diagnosis present

## 2024-08-18 DIAGNOSIS — Z794 Long term (current) use of insulin: Secondary | ICD-10-CM | POA: Diagnosis not present

## 2024-08-18 DIAGNOSIS — F32A Depression, unspecified: Secondary | ICD-10-CM | POA: Diagnosis not present

## 2024-08-18 DIAGNOSIS — N3946 Mixed incontinence: Secondary | ICD-10-CM | POA: Diagnosis not present

## 2024-08-18 DIAGNOSIS — M1990 Unspecified osteoarthritis, unspecified site: Secondary | ICD-10-CM | POA: Diagnosis not present

## 2024-08-18 DIAGNOSIS — K219 Gastro-esophageal reflux disease without esophagitis: Secondary | ICD-10-CM | POA: Diagnosis not present

## 2024-08-18 DIAGNOSIS — Z888 Allergy status to other drugs, medicaments and biological substances status: Secondary | ICD-10-CM

## 2024-08-18 DIAGNOSIS — E119 Type 2 diabetes mellitus without complications: Secondary | ICD-10-CM | POA: Diagnosis not present

## 2024-08-18 DIAGNOSIS — N8110 Cystocele, unspecified: Secondary | ICD-10-CM | POA: Diagnosis present

## 2024-08-18 DIAGNOSIS — Z7984 Long term (current) use of oral hypoglycemic drugs: Secondary | ICD-10-CM

## 2024-08-18 DIAGNOSIS — Z882 Allergy status to sulfonamides status: Secondary | ICD-10-CM | POA: Diagnosis not present

## 2024-08-18 DIAGNOSIS — Z79899 Other long term (current) drug therapy: Secondary | ICD-10-CM | POA: Diagnosis not present

## 2024-08-18 DIAGNOSIS — R159 Full incontinence of feces: Secondary | ICD-10-CM | POA: Diagnosis not present

## 2024-08-18 DIAGNOSIS — F419 Anxiety disorder, unspecified: Secondary | ICD-10-CM | POA: Diagnosis not present

## 2024-08-18 DIAGNOSIS — I2510 Atherosclerotic heart disease of native coronary artery without angina pectoris: Secondary | ICD-10-CM | POA: Diagnosis not present

## 2024-08-18 DIAGNOSIS — N812 Incomplete uterovaginal prolapse: Principal | ICD-10-CM | POA: Diagnosis present

## 2024-08-18 DIAGNOSIS — Z9109 Other allergy status, other than to drugs and biological substances: Secondary | ICD-10-CM

## 2024-08-18 DIAGNOSIS — N819 Female genital prolapse, unspecified: Secondary | ICD-10-CM | POA: Diagnosis not present

## 2024-08-18 HISTORY — PX: INJECTION, BULKING AGENT, URETHRA: SHX7596

## 2024-08-18 HISTORY — PX: PERINEOPLASTY: SHX2218

## 2024-08-18 HISTORY — PX: ANTERIOR (CYSTOCELE) AND POSTERIOR REPAIR (RECTOCELE) WITH XENFORM GRAFT AND SACROSPINOUS FIXATION: SHX6492

## 2024-08-18 HISTORY — PX: CYSTOSCOPY: SHX5120

## 2024-08-18 LAB — BASIC METABOLIC PANEL WITH GFR
Anion gap: 9 (ref 5–15)
BUN: 11 mg/dL (ref 8–23)
CO2: 25 mmol/L (ref 22–32)
Calcium: 9.7 mg/dL (ref 8.9–10.3)
Chloride: 103 mmol/L (ref 98–111)
Creatinine, Ser: 0.62 mg/dL (ref 0.44–1.00)
GFR, Estimated: >60 mL/min (ref >60)
Glucose, Bld: 112 mg/dL — ABNORMAL HIGH (ref 70–99)
Potassium: 4.1 mmol/L (ref 3.5–5.1)
Sodium: 137 mmol/L (ref 135–145)

## 2024-08-18 LAB — TYPE AND SCREEN
ABO/RH(D): O POS
Antibody Screen: NEGATIVE

## 2024-08-18 LAB — ABO/RH: ABO/RH(D): O POS

## 2024-08-18 LAB — HEMOGLOBIN A1C
Hgb A1c MFr Bld: 6.8 % — ABNORMAL HIGH (ref 4.8–5.6)
Mean Plasma Glucose: 148.46 mg/dL

## 2024-08-18 LAB — GLUCOSE, CAPILLARY
Glucose-Capillary: 106 mg/dL — ABNORMAL HIGH (ref 70–99)
Glucose-Capillary: 142 mg/dL — ABNORMAL HIGH (ref 70–99)

## 2024-08-18 SURGERY — ANTERIOR (CYSTOCELE) AND POSTERIOR REPAIR (RECTOCELE) WITH XENFORM GRAFT AND SACROSPINOUS FIXATION
Anesthesia: General | Site: Vagina

## 2024-08-18 MED ORDER — DEXAMETHASONE SOD PHOSPHATE PF 10 MG/ML IJ SOLN
INTRAMUSCULAR | Status: DC | PRN
  Administered 2024-08-18: 4 mg via INTRAVENOUS

## 2024-08-18 MED ORDER — CEFAZOLIN SODIUM-DEXTROSE 2-4 GM/100ML-% IV SOLN
2.0000 g | INTRAVENOUS | Status: AC
  Administered 2024-08-18: 2 g via INTRAVENOUS

## 2024-08-18 MED ORDER — SUGAMMADEX SODIUM 200 MG/2ML IV SOLN
INTRAVENOUS | Status: DC | PRN
  Administered 2024-08-18: 200 mg via INTRAVENOUS

## 2024-08-18 MED ORDER — CEFAZOLIN SODIUM-DEXTROSE 2-4 GM/100ML-% IV SOLN
INTRAVENOUS | Status: AC
  Filled 2024-08-18: qty 100

## 2024-08-18 MED ORDER — HEPARIN SODIUM (PORCINE) 5000 UNIT/ML IJ SOLN
5000.0000 [IU] | INTRAMUSCULAR | Status: AC
  Administered 2024-08-18: 5000 [IU] via SUBCUTANEOUS

## 2024-08-18 MED ORDER — ACETAMINOPHEN 500 MG PO TABS
1000.0000 mg | ORAL_TABLET | ORAL | Status: AC
  Administered 2024-08-18: 1000 mg via ORAL

## 2024-08-18 MED ORDER — LIDOCAINE-EPINEPHRINE 1 %-1:100000 IJ SOLN
INTRAMUSCULAR | Status: AC
  Filled 2024-08-18: qty 2

## 2024-08-18 MED ORDER — ROCURONIUM BROMIDE 10 MG/ML (PF) SYRINGE
PREFILLED_SYRINGE | INTRAVENOUS | Status: DC | PRN
  Administered 2024-08-18: 10 mg via INTRAVENOUS
  Administered 2024-08-18: 50 mg via INTRAVENOUS

## 2024-08-18 MED ORDER — PHENAZOPYRIDINE HCL 100 MG PO TABS
ORAL_TABLET | ORAL | Status: AC
  Filled 2024-08-18: qty 2

## 2024-08-18 MED ORDER — POVIDONE-IODINE 10 % EX SWAB: 2.0000 | Freq: Once | CUTANEOUS | Status: DC

## 2024-08-18 MED ORDER — CHLORHEXIDINE GLUCONATE 0.12 % MT SOLN
OROMUCOSAL | Status: AC
  Filled 2024-08-18: qty 15

## 2024-08-18 MED ORDER — MIDAZOLAM HCL (PF) 2 MG/2ML IJ SOLN
INTRAMUSCULAR | Status: DC | PRN
  Administered 2024-08-18: 1 mg via INTRAVENOUS

## 2024-08-18 MED ORDER — OXYCODONE HCL 5 MG PO TABS
5.0000 mg | ORAL_TABLET | Freq: Once | ORAL | Status: AC
  Administered 2024-08-18: 5 mg via ORAL

## 2024-08-18 MED ORDER — ACETAMINOPHEN 500 MG PO TABS
ORAL_TABLET | ORAL | Status: AC
  Filled 2024-08-18: qty 2

## 2024-08-18 MED ORDER — FENTANYL CITRATE (PF) 100 MCG/2ML IJ SOLN: 25.0000 ug | INTRAMUSCULAR | Status: DC | PRN

## 2024-08-18 MED ORDER — SODIUM CHLORIDE (PF) 0.9 % IJ SOLN
INTRAMUSCULAR | Status: AC
Start: 2024-08-18 — End: 2024-08-18
  Filled 2024-08-18: qty 10

## 2024-08-18 MED ORDER — DROPERIDOL 2.5 MG/ML IJ SOLN: 0.6250 mg | Freq: Once | INTRAMUSCULAR | Status: DC | PRN

## 2024-08-18 MED ORDER — CHLORHEXIDINE GLUCONATE 0.12 % MT SOLN
15.0000 mL | Freq: Once | OROMUCOSAL | Status: AC
  Administered 2024-08-18: 15 mL via OROMUCOSAL

## 2024-08-18 MED ORDER — GABAPENTIN 300 MG PO CAPS
300.0000 mg | ORAL_CAPSULE | ORAL | Status: AC
  Administered 2024-08-18: 300 mg via ORAL

## 2024-08-18 MED ORDER — MIDAZOLAM HCL 2 MG/2ML IJ SOLN
INTRAMUSCULAR | Status: AC
  Filled 2024-08-18: qty 2

## 2024-08-18 MED ORDER — FENTANYL CITRATE (PF) 250 MCG/5ML IJ SOLN
INTRAMUSCULAR | Status: DC | PRN
  Administered 2024-08-18: 50 ug via INTRAVENOUS
  Administered 2024-08-18: 100 ug via INTRAVENOUS

## 2024-08-18 MED ORDER — EPHEDRINE SULFATE-NACL 50-0.9 MG/10ML-% IV SOSY
PREFILLED_SYRINGE | INTRAVENOUS | Status: DC | PRN
Start: 2024-08-18 — End: 2024-08-18
  Administered 2024-08-18 (×2): 5 mg via INTRAVENOUS

## 2024-08-18 MED ORDER — SODIUM CHLORIDE 0.9 % IR SOLN
Status: DC | PRN
  Administered 2024-08-18: 1000 mL via INTRAVESICAL

## 2024-08-18 MED ORDER — PROPOFOL 10 MG/ML IV BOLUS
INTRAVENOUS | Status: DC | PRN
  Administered 2024-08-18: 120 mg via INTRAVENOUS

## 2024-08-18 MED ORDER — PROPOFOL 10 MG/ML IV BOLUS
INTRAVENOUS | Status: AC
Start: 2024-08-18 — End: 2024-08-18
  Filled 2024-08-18: qty 20

## 2024-08-18 MED ORDER — SODIUM CHLORIDE (PF) 0.9 % IJ SOLN
INTRAMUSCULAR | Status: DC | PRN
  Administered 2024-08-18: 20 mL

## 2024-08-18 MED ORDER — LIDOCAINE 2% (20 MG/ML) 5 ML SYRINGE
INTRAMUSCULAR | Status: AC
  Filled 2024-08-18: qty 5

## 2024-08-18 MED ORDER — LACTATED RINGERS IV SOLN: INTRAVENOUS | Status: DC

## 2024-08-18 MED ORDER — LIDOCAINE 2% (20 MG/ML) 5 ML SYRINGE
INTRAMUSCULAR | Status: DC | PRN
Start: 2024-08-18 — End: 2024-08-18
  Administered 2024-08-18: 40 mg via INTRAVENOUS

## 2024-08-18 MED ORDER — ROCURONIUM BROMIDE 10 MG/ML (PF) SYRINGE
PREFILLED_SYRINGE | INTRAVENOUS | Status: AC
  Filled 2024-08-18: qty 10

## 2024-08-18 MED ORDER — ONDANSETRON HCL 4 MG/2ML IJ SOLN
INTRAMUSCULAR | Status: DC | PRN
Start: 2024-08-18 — End: 2024-08-18
  Administered 2024-08-18: 4 mg via INTRAVENOUS

## 2024-08-18 MED ORDER — OXYCODONE HCL 5 MG PO TABS
ORAL_TABLET | ORAL | Status: AC
  Filled 2024-08-18: qty 1

## 2024-08-18 MED ORDER — GABAPENTIN 300 MG PO CAPS
ORAL_CAPSULE | ORAL | Status: AC
  Filled 2024-08-18: qty 1

## 2024-08-18 MED ORDER — ONDANSETRON HCL 4 MG/2ML IJ SOLN
INTRAMUSCULAR | Status: AC
  Filled 2024-08-18: qty 2

## 2024-08-18 MED ORDER — FENTANYL CITRATE (PF) 100 MCG/2ML IJ SOLN
INTRAMUSCULAR | Status: AC
  Filled 2024-08-18: qty 2

## 2024-08-18 MED ORDER — ORAL CARE MOUTH RINSE: 15.0000 mL | Freq: Once | OROMUCOSAL | Status: AC

## 2024-08-18 MED ORDER — HEPARIN SODIUM (PORCINE) 5000 UNIT/ML IJ SOLN
INTRAMUSCULAR | Status: AC
  Filled 2024-08-18: qty 1

## 2024-08-18 MED ORDER — PHENAZOPYRIDINE HCL 200 MG PO TABS
200.0000 mg | ORAL_TABLET | ORAL | Status: AC
  Administered 2024-08-18: 200 mg via ORAL
  Filled 2024-08-18: qty 1

## 2024-08-18 SURGICAL SUPPLY — 41 items
BAG DRAIN URO-CYSTO SKYTR STRL (DRAIN) ×4 IMPLANT
BLADE SURG 15 STRL LF DISP TIS (BLADE) ×4 IMPLANT
CATH FOLEY 2WAY SLVR 5CC 12FR (CATHETERS) ×4 IMPLANT
CATH ROBINSON RED A/P 12FR (CATHETERS) IMPLANT
DEVICE CAPIO SLIM SINGLE (INSTRUMENTS) ×4 IMPLANT
DILATOR CANAL MILEX (MISCELLANEOUS) IMPLANT
ELECTRODE REM PT RTRN 9FT ADLT (ELECTROSURGICAL) IMPLANT
GAUZE 4X4 16PLY ~~LOC~~+RFID DBL (SPONGE) IMPLANT
GLOVE BIOGEL PI IND STRL 6 (GLOVE) ×4 IMPLANT
GLOVE BIOGEL PI MICRO STRL 5.5 (GLOVE) ×4 IMPLANT
GLOVE SS PI 5.5 STRL (GLOVE) ×4 IMPLANT
GOWN STRL REUS W/ TWL LRG LVL3 (GOWN DISPOSABLE) ×4 IMPLANT
HIBICLENS CHG 4% 4OZ BTL (MISCELLANEOUS) ×4 IMPLANT
KIT TURNOVER KIT B (KITS) ×4 IMPLANT
MANIFOLD NEPTUNE II (INSTRUMENTS) ×4 IMPLANT
NDL ASPIRATION 22 (NEEDLE) IMPLANT
NDL HYPO 22X1.5 SAFETY MO (MISCELLANEOUS) ×4 IMPLANT
NEEDLE ASPIRATION 22 (NEEDLE) IMPLANT
NEEDLE HYPO 22X1.5 SAFETY MO (MISCELLANEOUS) ×4 IMPLANT
PACK CYSTO (CUSTOM PROCEDURE TRAY) ×4 IMPLANT
PACK VAGINAL WOMENS (CUSTOM PROCEDURE TRAY) ×4 IMPLANT
RETRACTOR LONE STAR DISPOSABLE (INSTRUMENTS) ×4 IMPLANT
RETRACTOR STAY HOOK 5MM (MISCELLANEOUS) ×4 IMPLANT
SET CYSTO IRRIGATION (SET/KITS/TRAYS/PACK) ×4 IMPLANT
SLEEVE SCD COMPRESS KNEE MED (STOCKING) ×4 IMPLANT
SOLN 0.9% NACL POUR BTL 1000ML (IV SOLUTION) ×4 IMPLANT
SPIKE FLUID TRANSFER (MISCELLANEOUS) IMPLANT
SUCTION TUBE FRAZIER 10FR DISP (SUCTIONS) ×4 IMPLANT
SURGIFLO W/THROMBIN 8M KIT (HEMOSTASIS) IMPLANT
SUT ABS MONO DBL WITH NDL 48IN (SUTURE) ×8 IMPLANT
SUT MON AB 2-0 SH27 (SUTURE) IMPLANT
SUT PDS AB 2-0 CT2 27 (SUTURE) ×8 IMPLANT
SUT PDS PLUS AB 0 CT-2 (SUTURE) IMPLANT
SUT VIC AB 2-0 SH 27XBRD (SUTURE) ×4 IMPLANT
SUT VIC AB 3-0 SH 18 (SUTURE) IMPLANT
SUT VICRYL 2-0 SH 8X27 (SUTURE) IMPLANT
SYR BULB EAR ULCER 3OZ GRN STR (SYRINGE) ×4 IMPLANT
SYSTEM URETHRAL BULK BULKAMID (Female Continence) IMPLANT
TOWEL GREEN STERILE (TOWEL DISPOSABLE) ×4 IMPLANT
TRAY FOL W/BAG SLVR 16FR STRL (SET/KITS/TRAYS/PACK) ×4 IMPLANT
TUBE CONNECTING 12X1/4 (SUCTIONS) IMPLANT

## 2024-08-18 NOTE — Discharge Instructions (Addendum)
 POST OPERATIVE INSTRUCTIONS  General Instructions Recovery (not bed rest) will last approximately 6 weeks Walking is encouraged, but refrain from strenuous exercise/ housework/ heavy lifting. No lifting >10lbs  Nothing in the vagina- NO intercourse, tampons or douching Bathing:  Do not submerge in water (NO swimming, bath, hot tub, etc) until after your postop visit. You can shower starting the day after surgery.  No driving until you are not taking narcotic pain medicine and until your pain is well enough controlled that you can slam on the breaks or make sudden movements if needed.   Taking your medications Please take your acetaminophen  and ibuprofen on a schedule for the first 48 hours. Take 600mg  ibuprofen, then take 500mg  acetaminophen  3 hours later, then continue to alternate ibuprofen and acetaminophen . That way you are taking each type of medication every 6 hours. Take the prescribed narcotic (oxycodone , tramadol, etc) as needed, with a maximum being every 4 hours.  Take a stool softener daily to keep your stools soft and preventing you from straining. If you have diarrhea, you decrease your stool softener. This is explained more below. We have prescribed you Miralax.  Reasons to Call the Nurse (see last page for phone numbers) Heavy Bleeding (changing your pad every 1-2 hours) Persistent nausea/vomiting Fever (100.4 degrees or more) Incision problems (pus or other fluid coming out, redness, warmth, increased pain)  Things to Expect After Surgery Mild to Moderate pain is normal during the first day or two after surgery. If prescribed, take Ibuprofen or Tylenol  first and use the stronger medicine for "break-through" pain. You can overlap these medicines because they work differently.   Constipation   To Prevent Constipation:  Eat a well-balanced diet including protein, grains, fresh fruit and vegetables.  Drink plenty of fluids. Walk regularly.  Depending on specific instructions  from your physician: take Miralax daily and additionally you can add a stool softener (colace/ docusate) and fiber supplement. Continue as long as you're on pain medications.   To Treat Constipation:  If you do not have a bowel movement in 2 days after surgery, you can take 2 Tbs of Milk of Magnesia 1-2 times a day until you have a bowel movement. If diarrhea occurs, decrease the amount or stop the laxative. If no results with Milk of Magnesia, you can drink a bottle of magnesium citrate which you can purchase over the counter.  Fatigue:  This is a normal response to surgery and will improve with time.  Plan frequent rest periods throughout the day.  Gas Pain:  This is very common but can also be very painful! Drink warm liquids such as herbal teas, bouillon or soup. Walking will help you pass more gas.  Mylicon or Gas-X can be taken over the counter.  Leaking Urine:  Varying amounts of leakage may occur after surgery.  This should improve with time. Your bladder needs at least 3 months to recover from surgery. If you leak after surgery, be sure to mention this to your doctor at your post-op visit. If you were taking medications for overactive bladder prior to surgery, be sure to restart the medications immediately after surgery.  Incisions: If you have incisions on your abdomen, the skin glue will dissolve on its own over time. It is ok to gently rinse with soap and water over these incisions but do not scrub.  Catheter Approximately 50% of patients are unable to urinate after surgery and need to go home with a catheter. This allows your bladder to  rest so it can return to full function. If you go home with a catheter, the office will call to set up a voiding trial a few days after surgery. For most patients, by this visit, they are able to urinate on their own. Long term catheter use is rare.   Return to Work  As work demands and recovery times vary widely, it is hard to predict when you will want  to return to work. If you have a desk job with no strenuous physical activity, and if you would like to return sooner than generally recommended, discuss this with your provider or call our office.   Post op concerns  For non-emergent issues, please call the Urogynecology Nurse. Please leave a message and someone will contact you within one business day.  You can also send a message through MyChart.   AFTER HOURS (After 5:00 PM and on weekends):  For urgent matters that cannot wait until the next business day. Call our office [phone] and connect to the doctor on call.  Please reserve this for important issues.   **FOR ANY TRUE EMERGENCY ISSUES CALL 911 OR GO TO THE NEAREST EMERGENCY ROOM.** Please inform our office or the doctor on call of any emergency.     APPOINTMENTS: Call [phone]  Please monitor your blood sugar levels and call your doctor if you experience persistent blood sugar levels > 200.    Post Anesthesia Home Care Instructions  Activity: Get plenty of rest for the remainder of the day. A responsible individual must stay with you for 24 hours following the procedure.  For the next 24 hours, DO NOT: -Drive a car -Advertising copywriter -Drink alcoholic beverages -Take any medication unless instructed by your physician -Make any legal decisions or sign important papers.  Meals: Start with liquid foods such as gelatin or soup. Progress to regular foods as tolerated. Avoid greasy, spicy, heavy foods. If nausea and/or vomiting occur, drink only clear liquids until the nausea and/or vomiting subsides. Call your physician if vomiting continues.  Special Instructions/Symptoms: Your throat may feel dry or sore from the anesthesia or the breathing tube placed in your throat during surgery. If this causes discomfort, gargle with warm salt water. The discomfort should disappear within 24 hours.

## 2024-08-18 NOTE — Anesthesia Postprocedure Evaluation (Signed)
 Anesthesia Post Note  Patient: Adrienne [name]  Procedure(s) Performed: ANTERIOR (CYSTOCELE) AND POSTERIOR REPAIR (RECTOCELE)  GRAFT AND SACROSPINOUS FIXATION (Vagina ) INJECTION, BULKING AGENT, URETHRA (Urethra) CYSTOSCOPY (Bladder) PERINEOPLASTY (Perineum)     Patient location during evaluation: PACU Anesthesia Type: General Level of consciousness: awake and alert Pain management: pain level controlled Vital Signs Assessment: post-procedure vital signs reviewed and stable Respiratory status: spontaneous breathing, nonlabored ventilation, respiratory function stable and patient connected to nasal cannula oxygen Cardiovascular status: blood pressure returned to baseline and stable Postop Assessment: no apparent nausea or vomiting Anesthetic complications: no   There were no known notable events for this encounter.  Last Vitals:  Vitals:   08/18/24 1156 08/18/24 1200  BP:  (!) 125/49  Pulse: 68 75  Resp: 17 18  Temp:  36.7 C  SpO2: 91% 94%    Last Pain:  Vitals:   08/18/24 1156  TempSrc:   PainSc: 7                  Juventino Pavone P Trustin Chapa

## 2024-08-18 NOTE — Transfer of Care (Signed)
 Immediate Anesthesia Transfer of Care Note  Patient: Adrienne [name]  Procedure(s) Performed: ANTERIOR (CYSTOCELE) AND POSTERIOR REPAIR (RECTOCELE)  GRAFT AND SACROSPINOUS FIXATION (Vagina ) INJECTION, BULKING AGENT, URETHRA (Urethra) CYSTOSCOPY (Bladder) PERINEOPLASTY (Perineum)  Patient Location: PACU  Anesthesia Type:General  Level of Consciousness: awake, alert , oriented, and patient cooperative  Airway & Oxygen Therapy: Patient Spontanous Breathing and Patient connected to face mask oxygen  Post-op Assessment: Report given to RN and Post -op Vital signs reviewed and stable  Post vital signs: Reviewed and stable  Last Vitals:  Vitals Value Taken Time  BP 134/52 08/18/24 11:06  Temp    Pulse 66 08/18/24 11:10  Resp 12 08/18/24 11:10  SpO2 99 % 08/18/24 11:10  Vitals shown include unfiled device data.  Last Pain:  Vitals:   08/18/24 0643  TempSrc: Oral  PainSc: 5       Patients Stated Pain Goal: 6 (08/18/24 9356)  Complications: There were no known notable events for this encounter.

## 2024-08-18 NOTE — Op Note (Signed)
 Operative Note  Preoperative Diagnosis: Stage II pelvic organ prolapse, mixed urinary incontinence, fecal incontinence, Type II diabetes  Postoperative Diagnosis: Stage II pelvic organ prolapse, mixed urinary incontinence, fecal incontinence, Type II diabetes, urethral dilation  Procedures performed:   Sacrospinous ligament hysteropexy, anterior/posterior repair, perineorrhaphy, urethral bulking, cystoscopy   Implants:  Implant Name Type Inv. Item Serial No. Manufacturer Lot No. LRB No. Used Action  Bulkamid    [employer] [id number] N/A 1 Implanted    Attending Surgeon: Lianne Leila Gillis, MD  Assistant Surgeon: n/a  Assistant: Jorene Moats, PA  Anesthesia: General endotracheal  Findings: 1. On vaginal exam, stage II prolapse present  2. On cystoscopy, normal bladder and urethral mucosa without injury or lesion. Brisk bilateral ureteral efflux present.    Specimens: * No specimens in log *  Estimated blood loss: 50 mL  IV fluids: 900 mL  Urine output: 100 mL  Complications: none  Procedure in Detail: After informed consent was obtained, the patient was taken to the operating room where anesthesia was induced and found to be adequate. She was placed in dorsal lithotomy position, taking care to avoid any traction on the extremities, and then prepped and draped in the usual sterile fashion. A self-retaining lonestar retractor was placed using four elastic blue stays.  After a foley catheter was inserted into the urethra, the location of the midurethra was palpated. Three Allis clamps were along the anterior vaginal wall defect with two along the cervicovaginal function. 1% lidocaine  with epinephrine  was injected into the vaginal mucosa.  A triangular incision was made between these three Allis clamps with a 15 blade scalpel.  Allis clamps were placed along this incision and Metzenbaum scissors were used to undermine the vaginal mucosa along the incision.  The vaginal mucosa was then  sharply dissected off to the vesicovaginal septum bilaterally to the level of the pubic rami.  Anterior plication of the vesicovaginal septum was then performed using 2-0 PDS. They foley catheter was removed. A 70-degree cystoscope was introduced, and 360-degree inspection revealed no trauma in the bladder, with bilateral ureteral efflux. The cystoscope was removed. The medial uterosacral sutures were then tied down. Cystoscopy was repeated and brisk bilateral ureteral efflux was noted. The bladder was drained and the cystoscope was removed.  The Foley catheter was reinserted.  For the sacrospinous ligament fixation (SSLF), the ischial spine was accessed on the right side via dissection with Metzenbaum scissors and blunt dissection.  The sacrospinous ligament was palpated. Two 0 PDS suture was then placed at the sacrospinous ligament two fingerbreadths medial to the ischial spine, in order to avoid the pudendal neurovascular bundle, using a Capio needle driver.  The PDS suture was attached to the vaginal epithelium on the ipsilateral side of the vaginal apex and held. The vaginal mucosal edges were trimmed and the incision reapproximated with 2-0 Vicryl in a running fashion. The SSLF suture was then tied down with excellent support of the anterior and apical vagina. The foley catheter was removed. A 70-degree cystoscope was introduced, and 360-degree inspection revealed no trauma in the bladder, with bilateral ureteral efflux. The cystoscope was removed. The medial uterosacral sutures were then tied down. Cystoscopy was repeated and brisk bilateral ureteral efflux was noted. The bladder was drained and the cystoscope was removed.     A 0 degree urethroscope was attempted to pass the urethral meatus with resistance noted.  Gentle urethral dilation was performed sequentially to #9 urethral dilator. The needle was primed.  The cystoscope  was inserted under direct visualization at the urethral meatus to the level of  the bladder neck.  The needle was inserted 2 cm and the scope was pulled back into the urethra 2 cm.  The needle was inserted bevel up at the 5 o'clock position and the Bulkamid was injected to obtain coaptation.  This was repeated at the 12 o'clock,  10 o'clock and 7 o'clock positions.   A total of 2 1ml syringes were used and good circumferential coaptation was noted. A Crede maneuver showed no leakage from the urethra meatus and a 12Fr foley catheter was replaced.  Attention was then turned to the posterior vagina.  Two Allis clamps were placed at the introitus approximately 3cm from the urethra meatus. 1% lidocaine  with epinephrine  was injected into the vaginal mucosa in the posterior vaginal wall and perineum for hydrodissection and hemostasis. A midline incision over the posterior vaginal wall was made with a 15 blade scalpel.  The rectovaginal septum was then dissected off the vaginal mucosa bilaterally. The rectovaginal septum was then plicated in a continuous running fashion with 2-0 PDS while one finger was placed in the rectum to prevent rectal penetration.  After placement of the first plication stitch two fingers were inserted into the vaginal to confirm adequate caliber.  The suture incorporated the perineal body in a U stitch fashion and the bulbocavernosus muscles. A 2-0 Vicryl was used in a subcuticular fashion to re-approximate the hymenal ring. After plication, the excess vaginal mucosa was trimmed and the vaginal mucosa was reapproximated using 2-0 Vicryl suture in a continuous fashion.  The vagina was copiously irrigated.  Hemostasis was noted. A rectal examination was normal and confirmed no sutures within the rectum. Three fingers passed through the vaginal opening without difficulty.  The patient tolerated the procedure well.  She was awakened from anesthesia and transferred to the recovery room in stable condition. All counts were correct x 2.

## 2024-08-18 NOTE — Anesthesia Procedure Notes (Signed)
 Procedure Name: Intubation Date/Time: 08/18/2024 8:37 AM  Performed by: Erick Fitz, CRNAPre-anesthesia Checklist: Patient identified, Emergency Drugs available, Suction available, Patient being monitored and Timeout performed Patient Re-evaluated:Patient Re-evaluated prior to induction Oxygen Delivery Method: Circle system utilized Preoxygenation: Pre-oxygenation with 100% oxygen Induction Type: IV induction Ventilation: Mask ventilation without difficulty Laryngoscope Size: Mac and 3 Grade View: Grade II Tube type: Oral Tube size: 7.0 mm Number of attempts: 1 Airway Equipment and Method: Stylet Placement Confirmation: ETT inserted through vocal cords under direct vision, positive ETCO2, CO2 detector and breath sounds checked- equal and bilateral Secured at: 22 cm Tube secured with: Tape (secured with 1/2 white silk tape) Dental Injury: Teeth and Oropharynx as per pre-operative assessment

## 2024-08-18 NOTE — Research (Signed)
 Chart was open for review due to be assigned to this patient by charge nurse in [facility] to be admin to [facility] on 08/18/2024 after surgery.

## 2024-08-18 NOTE — Anesthesia Preprocedure Evaluation (Signed)
 Anesthesia Evaluation  Patient identified by MRN, date of birth, ID band Patient awake    Reviewed: Allergy  & Precautions, NPO status , Patient's Chart, lab work & pertinent test results  Airway Mallampati: II  TM Distance: >3 FB Neck ROM: Full    Dental no notable dental hx.    Pulmonary    Pulmonary exam normal        Cardiovascular hypertension, Pt. on medications + CAD   Rhythm:Regular Rate:Normal     Neuro/Psych   Anxiety Depression    negative neurological ROS     GI/Hepatic Neg liver ROS, hiatal hernia, PUD,GERD  ,,  Endo/Other  diabetes, Type 2, Insulin Dependent, Oral Hypoglycemic Agents    Renal/GU   Female GU complaint     Musculoskeletal  (+) Arthritis , Osteoarthritis,    Abdominal Normal abdominal exam  (+)   Peds  Hematology Lab Results      Component                Value               Date                      WBC                      11.5 (H)            08/16/2010                HGB                      14.3                01/07/2014                HCT                      42.0                01/07/2014                MCV                      88.1                08/16/2010                PLT                      286.0               08/16/2010             Lab Results      Component                Value               Date                      NA                       140                 03/03/2023                K  4.4                 03/03/2023                CO2                      29                  03/03/2023                GLUCOSE                  75                  03/03/2023                BUN                      12                  03/03/2023                CREATININE               0.72                03/03/2023                CALCIUM                  9.5                 03/03/2023                GFRNONAA                 >60                 03/03/2023               Anesthesia Other Findings   Reproductive/Obstetrics                              Anesthesia Physical Anesthesia Plan  ASA: 2  Anesthesia Plan: General   Post-op Pain Management: Tylenol  PO (pre-op)*, Gabapentin PO (pre-op)* and Toradol IV (intra-op)*   Induction: Intravenous  PONV Risk Score and Plan: 3 and Ondansetron , Dexamethasone , Midazolam  and Treatment may vary due to age or medical condition  Airway Management Planned: Mask and Oral ETT  Additional Equipment: None  Intra-op Plan:   Post-operative Plan: Extubation in OR  Informed Consent: I have reviewed the patients History and Physical, chart, labs and discussed the procedure including the risks, benefits and alternatives for the proposed anesthesia with the patient or authorized representative who has indicated his/her understanding and acceptance.     Dental advisory given  Plan Discussed with: CRNA  Anesthesia Plan Comments:         Anesthesia Quick Evaluation

## 2024-08-18 NOTE — Interval H&P Note (Signed)
 History and Physical Interval Note:  08/18/2024 8:09 AM  Adrienne [name]  has presented today for surgery, with the diagnosis of mixed incontinence; POP stage 3 cycocele.  The various methods of treatment have been discussed with the patient and family. After consideration of risks, benefits and other options for treatment, the patient has consented to  Procedure(s) with comments: ANTERIOR (CYSTOCELE) AND POSTERIOR REPAIR (RECTOCELE)  GRAFT AND SACROSPINOUS FIXATION (N/A) INJECTION, BULKING AGENT, URETHRA (N/A) CYSTOSCOPY (N/A) PERINEOPLASTY (N/A) - possible perineorrhaphy as a surgical intervention.  The patient's history has been reviewed, patient examined, no change in status, stable for surgery.  I have reviewed the patient's chart and labs.  Questions were answered to the patient's satisfaction.     [name] ONEIDA Gillis

## 2024-08-18 NOTE — Progress Notes (Signed)
 Foley catheter reinserted using sterile technique by Morna Fila, RN with Rosaline Dines, RN as observer / assist. Patient education provided and verbalized understanding. Patient tolerated procedure well, and instructions for home care provided with discharge instructions.

## 2024-08-19 ENCOUNTER — Telehealth: Payer: Self-pay | Admitting: *Deleted

## 2024-08-19 ENCOUNTER — Encounter (HOSPITAL_COMMUNITY): Payer: Self-pay | Admitting: Obstetrics

## 2024-08-19 NOTE — Discharge Summary (Signed)
 Physician Discharge Summary   Patient: Adrienne [name] MRN: [id number] DOB: 29-Mar-1951  Admit date:     08/18/2024  Discharge date: 08/18/2024  Discharge Physician: Lianne ONEIDA Gillis   PCP: Tisovec, Richard W, MD   Recommendations at discharge:   Void trial in the office in 1-2 days Postop Follow-up 09/27/24  Discharge Diagnoses: Principal Problem: Stage II pelvic organ prolapse, mixed urinary incontinence, fecal incontinence, Type II diabetes   Hospital Course:   Assessment and Plan: No notes have been filed under this hospital service. Service: Hospitalist   Pain control - [place]  Controlled Substance Reporting System database was reviewed. and patient was instructed, not to drive, operate heavy machinery, perform activities at heights, swimming or participation in water activities or provide baby-sitting services while on Pain, Sleep and Anxiety Medications; until their outpatient Physician has advised to do so again. Also recommended to not to take more than prescribed Pain, Sleep and Anxiety Medications.   Procedures performed:  Sacrospinous ligament hysteropexy, anterior/posterior repair, perineorrhaphy, urethral bulking, cystoscopy   Disposition: Home Diet recommendation:  Discharge Diet Orders (From admission, onward)     Start     Ordered   08/18/24 0000  Diet Carb Modified        08/18/24 1127           Carb modified diet  DISCHARGE MEDICATION: Allergies as of 08/18/2024       Reactions   Molds & Smuts    Congestion    Exenatide Other (See Comments)   Other Reaction(s): Injection pain   Sulfa Antibiotics Rash        Medication List     STOP taking these medications    celecoxib 200 MG capsule Commonly known as: CELEBREX       TAKE these medications    acetaminophen  500 MG tablet Commonly known as: TYLENOL  Take 1 tablet (500 mg total) by mouth every 6 (six) hours as needed (pain). Notes to patient: Received Tylenol  1,000 mg at 6:49  am.   ALPRAZolam 0.25 MG tablet Commonly known as: XANAX TAKE 1 TABLET BY MOUTH ONCE DAILY AS NEEDED FOR ANXIETY OR SLEEP   aspirin 81 MG tablet Take 81 mg by mouth daily.   azelastine  0.1 % nasal spray Commonly known as: ASTELIN  Place 2 sprays into both nostrils 2 (two) times daily as needed for rhinitis. Use in each nostril as directed   calcium carbonate 600 MG Tabs tablet Commonly known as: OS-CAL Take 500 mg by mouth 2 (two) times daily with a meal.   cyclobenzaprine 10 MG tablet Commonly known as: FLEXERIL Take 10 mg by mouth 2 (two) times daily as needed for muscle spasms.   diltiazem  240 MG 24 hr tablet Commonly known as: CARDIZEM  LA Take 1 tablet by mouth once daily   empagliflozin 25 MG Tabs tablet Commonly known as: JARDIANCE Take 25 mg by mouth daily.   estradiol  0.01 % Crea vaginal cream Commonly known as: ESTRACE  PLACE 0.5 G VAGINALLY 2 (TWO) TIMES A WEEK. PLACE 0.5 G NIGHTLY FOR TWO WEEKS THEN TWICE A WEEK AFTER   fluticasone  50 MCG/ACT nasal spray Commonly known as: FLONASE  Place 1 spray into both nostrils in the morning and at bedtime.   FreeStyle Libre 3 Sensor Misc Inject into the skin daily in the afternoon.   furosemide 20 MG tablet Commonly known as: LASIX Take 20 mg by mouth daily.   Gvoke HypoPen 2-Pack 1 MG/0.2ML Soaj Generic drug: Glucagon as directed Subcutaneous for severe hypoglycemia  ibuprofen 600 MG tablet Commonly known as: ADVIL Take 1 tablet (600 mg total) by mouth every 6 (six) hours as needed.   insulin glargine 100 UNIT/ML injection Commonly known as: LANTUS Inject 17 Units into the skin daily. What changed: how much to take   Iron Slow Release 140 (45 Fe) MG Tbcr Generic drug: Ferrous Sulfate Take 1 tablet by mouth daily.   isosorbide mononitrate 30 MG 24 hr tablet Commonly known as: IMDUR Take 15 mg by mouth daily.   levocetirizine 5 MG tablet Commonly known as: XYZAL  TAKE 1 TABLET BY MOUTH ONCE DAILY IN THE  EVENING   metFORMIN 1000 MG tablet Commonly known as: GLUCOPHAGE Take 1,000 mg by mouth 2 (two) times daily with a meal.   multivitamin tablet Take 1 tablet by mouth daily.   oxyCODONE  5 MG immediate release tablet Commonly known as: Oxy IR/ROXICODONE  Take 1 tablet (5 mg total) by mouth every 4 (four) hours as needed for severe pain (pain score 7-10). What changed: additional instructions   ramipril 10 MG tablet Commonly known as: ALTACE Take 5 mg by mouth daily.   rosuvastatin 10 MG tablet Commonly known as: CRESTOR Take 10 mg by mouth daily.   Trulicity 4.5 MG/0.5ML Soaj Generic drug: Dulaglutide Inject 4.5 mg into the skin once a week. Tuesday   venlafaxine XR 37.5 MG 24 hr capsule Commonly known as: EFFEXOR-XR Take 1 capsule by mouth. DAILY   Voquezna 20 MG Tabs Generic drug: Vonoprazan Fumarate Take 1 tablet by mouth daily.        Follow-up Information     Guadlupe Lianne DASEN, MD Follow up on 09/27/2024.   Specialties: Obstetrics, Gynecology Why: Call the office and return in 1-2 days if you are discharged with a foley catheter Contact information: [address] [place] [address] [phone]                 Discharge Exam: Fredricka Weights   08/18/24 0643  Weight: 58.1 kg    Condition at discharge: good  The results of significant diagnostics from this hospitalization (including imaging, microbiology, ancillary and laboratory) are listed below for reference.   Imaging Studies: No results found.  Microbiology: Results for orders placed or performed in visit on 10/18/13  Helicobacter pylori screen-biopsy     Status: None   Collection Time: 10/18/13  3:45 PM   Specimen: Stomach; Tissue  Result Value Ref Range Status   UREASE Negative Negative Final    Labs: CBC: No results for input(s): WBC, NEUTROABS, HGB, HCT, MCV, PLT in the last 168 hours. Basic Metabolic Panel: Recent Labs  Lab 08/18/24 0705  NA 137  K 4.1  CL 103   CO2 25  GLUCOSE 112*  BUN 11  CREATININE 0.62  CALCIUM 9.7   Liver Function Tests: No results for input(s): AST, ALT, ALKPHOS, BILITOT, PROT, ALBUMIN in the last 168 hours. CBG: Recent Labs  Lab 08/18/24 0704 08/18/24 1123  GLUCAP 106* 142*    Discharge time spent: less than 30 minutes.  Signed: Lianne DASEN Guadlupe, MD 08/19/2024

## 2024-08-19 NOTE — Telephone Encounter (Signed)
 Adrienne [name]  underwent Anterior (cystocele) And Posterior Repair (rectocele)  Graft And Sacrospinous Fixation, Injection, Bulking Agent, Urethra, Cystoscopy, and Perineoplasty  on 08/18/2024  with [] Dr Marilynne [x] Dr Guadlupe.  The patient reports that her pain is not controlled.  She is taking [] No Medication [x] Acetaminophen  500mg  every 6 hours [x] Ibuprofen 600mg  every 6 hours or []  Prescribed Narcotic.  Her pain level is 4[x] with medication [] Without medication is.   She is having very slight vaginal bleeding.  The patient is tolerating PO fluids and solids. She has not had a bowel movement and is taking Miralax for a bowel regimen. She is not passing gas.  She was discharged with a catheter.    [x] Discharged with a catheter, the patient is having any concerns with her catheter.  She will return for a voiding trial. [x] Verified scheduled date and time with patient.  She does not having any additional questions.  Reviewed Post operative instructions as needed to answer additional questions.   CC'd note to patient's provider.

## 2024-08-20 ENCOUNTER — Ambulatory Visit

## 2024-08-20 NOTE — Progress Notes (Signed)
 Adrienne [name]  underwent Anterior (cystocele) And Posterior Repair (rectocele)  Graft And Sacrospinous Fixation, Injection, Bulking Agent, Urethra, Cystoscopy, and Perineoplasty  on 08/18/2024  with [] Dr Marilynne [] Dr Guadlupe.  The patient reports that her pain is controlled.  She is taking [] No Medication [x] Acetaminophen  500mg  every 6 hours [x] Ibuprofen  600mg  every 6 hours or [x]  Prescribed Narcotic.  Her pain level is 2[] with medication [] Without medication is.   She reports vaginal bleeding.  The patient is tolerating PO fluids and solids. She has had a bowel movement and is taking Miralax for a bowel regimen. She is not passing gas.  She was discharged with a catheter.   []  Discharged without a catheter, the patient does feel as if she is emptying her bladder.  [] Discharged with a catheter, the patient is not having any concerns with her catheter.  She will return for a voiding trial. [] Verified scheduled date and time with patient.  She does not having any additional questions.  Reviewed Post operative instructions as needed to answer additional questions.   CC'd note to patient's provider.  Adrienne [name] underwent Anterior (cystocele) And Posterior Repair (rectocele)  Graft And Sacrospinous Fixation, Injection, Bulking Agent, Urethra, Cystoscopy, and Perineoplasty on 08/18/2024  She presents for a voiding trial.   Patient was identified with 2 identifiers.  The patient states she does not have any concerns with the foley placed.  200 mL of NS was instilled into the bladder via a catheter.  The catheter was removed and patient was instructed to void into the urinary hat.  She voided 300 mL.  The post void residual measured by bladder scan was 21 mL.  She did pass the voiding trial.  The patient was not sent home with a catheter.    The patient received aftercare instructions and will follow up as scheduled.

## 2024-08-20 NOTE — Patient Instructions (Signed)
  Please drink lots of water  and try to expel as much urine as you are inputting. If you are unable to urinate, give the office a call before 3 pm.     Please keep all future appointments and if you have any questions or concerns please feel free to contact our office at [phone].

## 2024-08-27 ENCOUNTER — Other Ambulatory Visit: Payer: Self-pay | Admitting: Internal Medicine

## 2024-09-01 ENCOUNTER — Other Ambulatory Visit: Payer: Self-pay | Admitting: Obstetrics and Gynecology

## 2024-09-01 DIAGNOSIS — Z1231 Encounter for screening mammogram for malignant neoplasm of breast: Secondary | ICD-10-CM

## 2024-09-21 ENCOUNTER — Ambulatory Visit: Admitting: Internal Medicine

## 2024-09-27 ENCOUNTER — Ambulatory Visit: Admitting: Obstetrics

## 2024-09-27 ENCOUNTER — Other Ambulatory Visit (HOSPITAL_COMMUNITY)
Admission: RE | Admit: 2024-09-27 | Discharge: 2024-09-27 | Disposition: A | Discharge status: Home / Self Care | Source: Ambulatory Visit | Attending: Obstetrics | Admitting: Obstetrics

## 2024-09-27 ENCOUNTER — Encounter: Payer: Self-pay | Admitting: Obstetrics

## 2024-09-27 VITALS — BP 112/70 | HR 80

## 2024-09-27 DIAGNOSIS — N952 Postmenopausal atrophic vaginitis: Secondary | ICD-10-CM

## 2024-09-27 DIAGNOSIS — R159 Full incontinence of feces: Secondary | ICD-10-CM

## 2024-09-27 DIAGNOSIS — N898 Other specified noninflammatory disorders of vagina: Secondary | ICD-10-CM | POA: Insufficient documentation

## 2024-09-27 DIAGNOSIS — Z48816 Encounter for surgical aftercare following surgery on the genitourinary system: Secondary | ICD-10-CM

## 2024-09-27 DIAGNOSIS — N3946 Mixed incontinence: Secondary | ICD-10-CM

## 2024-09-27 MED ORDER — TROSPIUM CHLORIDE ER 60 MG PO CP24: 1.0000 | ORAL_CAPSULE | Freq: Every day | ORAL | 2 refills | Status: AC

## 2024-09-27 NOTE — Assessment & Plan Note (Signed)
-   prior superficial abrasion with bleeding with pessary removal - For symptomatic vaginal atrophy options include lubrication with a water-based lubricant, personal hygiene measures and barrier protection against wetness, and estrogen replacement in the form of vaginal cream, vaginal tablets, or a time-released vaginal ring.   - resume low dose vaginal atrophy to 1g twice a week - nuswab pending to r/o infectious etiology due to vaginal discharge

## 2024-09-27 NOTE — Assessment & Plan Note (Signed)
-   history of IBS-D trigger by stress, recently with constipation managed with colace - Treatment options include anti-diarrhea medication (loperamide/ Imodium OTC or prescription lomotil), fiber supplements, physical therapy, and possible sacral neuromodulation or surgery.   - denies blood in stool, unintended weight loss - encouraged Kegel exercises, caffeine reduction, and squatting position for defecation - encouraged to resume titration of fiber supplementation and reduce miralax dosing if it exacerbates loose stool - trial of trospium after optimization of stool consistency with Rx sent - We discussed the fact that surgery for the rectocele may address bowel leakage but we cannot predict how much it may improve. She will still need to work on keeping a good stool consistency (well-formed, not hard or loose).

## 2024-09-27 NOTE — Assessment & Plan Note (Signed)
-   pending Nuswab to r/o infectious etiology - resume low dose vaginal estrogen

## 2024-09-27 NOTE — Assessment & Plan Note (Signed)
-   04/08/24 POCT UA + glucose only due to T2DM, PVR 16mL - s/p urethral bulking 08/18/24 - We discussed the symptoms of overactive bladder (OAB), which include urinary urgency, urinary frequency, nocturia, with or without urge incontinence.  While we do not know the exact etiology of OAB, several treatment options exist. We discussed management including behavioral therapy (decreasing bladder irritants, urge suppression strategies, timed voids, bladder retraining), physical therapy, medication; for refractory cases posterior tibial nerve stimulation, sacral neuromodulation, and intravesical botulinum toxin injection.  For anticholinergic medications, we discussed the potential side effects of anticholinergics including dry eyes, dry mouth, constipation, cognitive impairment and urinary retention. For Beta-3 agonist medication, we discussed the potential side effect of elevated blood pressure which is more likely to occur in individuals with uncontrolled hypertension. - prior samples of Gemtesa  with relief - continue fluid management and reduction of caffeine use - encouraged and reviewed Kegel exercises - trial of Trospium after optimization of stool consistency. Please review side effects of anticholinergic medications and the potential side effects of anticholinergics including dry eyes, dry mouth, constipation, cognitive impairment and urinary retention. Stop medication and seek care immediately if she experiences visual changes or inability to void

## 2024-09-27 NOTE — Progress Notes (Signed)
 Adrienne [name]  Date of Visit: 09/27/2024  History of Present Illness: Adrienne [name] is a 73 y.o. female scheduled today for a post-operative visit.   Surgery: s/p Sacrospinous ligament hysteropexy, anterior/posterior repair, perineorrhaphy, urethral bulking, cystoscopy  on 08/18/24  She passed her postoperative void trial.   Postoperative course was uncomplicated.   Today she reports changing pad 3x/day due to vaginal discharge Denies bleeding, itching or odor.  T2DM with interrmittent glucose in the 200s  UTI in the last 6 weeks? No  Pain? Yes from sciatica and intermittent back pain 1-2x/week She has returned to her normal activity (except for postop restrictions) Vaginal bulge? No  Stress incontinence: No  Urgency/frequency: No  Day time voids 5-8 from 6-8.  Nocturia: 0-2 times per night resolved to 0x/night Drinks: 32oz water per day, 40oz diet caffeine free mountain dew (cutdown from 3-4 bottles/day), 8oz coffee  Urge incontinence: Yes  1x/day with 1st void Voiding dysfunction: No  Bowel issues: Yes Reports FI x 4 since Christmas with miralax, reduce dose to 1/2 capful since Friday with Type I and Type VII stool. Baseline 1x/wk with IBS-D. Reports dietary changes during the holidays  Subjective Success: Do you usually have a bulge or something falling out that you can see or feel in the vaginal area? No  Retreatment Success: Any retreatment with surgery or pessary for any compartment? No   Pathology results: n/a  Medications: She has a current medication list which includes the following prescription(s): acetaminophen , alprazolam, aspirin, azelastine , calcium carbonate, freestyle libre 3 sensor, cyclobenzaprine, diltiazem , empagliflozin, estradiol , ferrous sulfate, fluticasone , furosemide, gvoke hypopen 2-pack, ibuprofen , insulin glargine, isosorbide mononitrate, levocetirizine, metformin, multivitamin, oxycodone , ramipril, rosuvastatin, trospium chloride, trulicity,  venlafaxine xr, voquezna , [DISCONTINUED] fluoxetine, and [DISCONTINUED] rosiglitazone-metformin, and the following Facility-Administered Medications: dexamethasone .   Allergies: Patient is allergic to molds & smuts, exenatide, and sulfa antibiotics.   Physical Exam: BP 112/70   Pulse 80   Abdomen: soft, non-tender, without masses or organomegaly Pelvic Examination: Vagina: Incisions healing well, no significant drainage, no dehiscence, no significant erythema. Sutures are present at incision line and there is not granulation tissue. No tenderness along the anterior or posterior vagina. No apical tenderness. No pelvic masses. Yellow/white discharge noted with sutures removed from anterior vaginal wall  POP-Q: POP-Q  -3                                            Aa   -3                                           Ba  -6                                              C   2                                            Gh  3  Pb  7                                            tvl   -3                                            Ap  -3                                            Bp                                                 D    ---------------------------------------------------------  Assessment and Plan:  1. Urinary incontinence, mixed   2. Incontinence of feces, unspecified fecal incontinence type   3. Vaginal atrophy   4. Vaginal discharge    Urinary incontinence, mixed Assessment & Plan: - 04/08/24 POCT UA + glucose only due to T2DM, PVR 16mL - s/p urethral bulking 08/18/24 - We discussed the symptoms of overactive bladder (OAB), which include urinary urgency, urinary frequency, nocturia, with or without urge incontinence.  While we do not know the exact etiology of OAB, several treatment options exist. We discussed management including behavioral therapy (decreasing bladder irritants, urge suppression strategies, timed voids, bladder  retraining), physical therapy, medication; for refractory cases posterior tibial nerve stimulation, sacral neuromodulation, and intravesical botulinum toxin injection.  For anticholinergic medications, we discussed the potential side effects of anticholinergics including dry eyes, dry mouth, constipation, cognitive impairment and urinary retention. For Beta-3 agonist medication, we discussed the potential side effect of elevated blood pressure which is more likely to occur in individuals with uncontrolled hypertension. - prior samples of Gemtesa  with relief - continue fluid management and reduction of caffeine use - encouraged and reviewed Kegel exercises - trial of Trospium after optimization of stool consistency. Please review side effects of anticholinergic medications and the potential side effects of anticholinergics including dry eyes, dry mouth, constipation, cognitive impairment and urinary retention. Stop medication and seek care immediately if she experiences visual changes or inability to void  Orders: -     Trospium Chloride ER; Take 1 capsule (60 mg total) by mouth daily.  Dispense: 30 capsule; Refill: 2  Incontinence of feces, unspecified fecal incontinence type Assessment & Plan: - history of IBS-D trigger by stress, recently with constipation managed with colace - Treatment options include anti-diarrhea medication (loperamide/ Imodium OTC or prescription lomotil), fiber supplements, physical therapy, and possible sacral neuromodulation or surgery.   - denies blood in stool, unintended weight loss - encouraged Kegel exercises, caffeine reduction, and squatting position for defecation - encouraged to resume titration of fiber supplementation and reduce miralax dosing if it exacerbates loose stool - trial of trospium after optimization of stool consistency with Rx sent - We discussed the fact that surgery for the rectocele may address bowel leakage but we cannot predict how much it  may improve. She will still need to work on keeping a good stool consistency (well-formed, not hard or loose).  Vaginal atrophy Assessment & Plan: - prior superficial abrasion with bleeding with pessary removal - For symptomatic vaginal atrophy options include lubrication with a water-based lubricant, personal hygiene measures and barrier protection against wetness, and estrogen replacement in the form of vaginal cream, vaginal tablets, or a time-released vaginal ring.   - resume low dose vaginal atrophy to 1g twice a week - nuswab pending to r/o infectious etiology due to vaginal discharge   Vaginal discharge Assessment & Plan: - pending Nuswab to r/o infectious etiology - resume low dose vaginal estrogen  Orders: -     Cervicovaginal ancillary only  - Can resume regular activity including exercise and intercourse,  if desired.  - Discussed avoidance of heavy lifting and straining long term to reduce the risk of recurrence.   All questions answered.   Return in about 6 weeks (around 11/08/2024) for postop visit.

## 2024-09-27 NOTE — Patient Instructions (Addendum)
 Continue to titrate miralax dosing, resume fiber supplementation   Once your bowel movement is optimized, start Trospium nightly.  We discussed side effects of anticholinergic medications and the potential side effects of anticholinergics including dry eyes, dry mouth, constipation, cognitive impairment and urinary retention. Stop medication and seek care immediately if you experiences visual changes or inability to void  Resume vaginal estrogen 1g twice a week.

## 2024-09-28 DIAGNOSIS — N898 Other specified noninflammatory disorders of vagina: Secondary | ICD-10-CM

## 2024-09-28 LAB — CERVICOVAGINAL ANCILLARY ONLY
Bacterial Vaginitis (gardnerella): NEGATIVE
Candida Glabrata: NEGATIVE
Candida Vaginitis: POSITIVE — AB
Comment: NEGATIVE
Comment: NEGATIVE
Comment: NEGATIVE

## 2024-09-28 MED ORDER — FLUCONAZOLE 150 MG PO TABS: 150.0000 mg | ORAL_TABLET | Freq: Once | ORAL | 1 refills | Status: AC

## 2024-09-29 ENCOUNTER — Ambulatory Visit
Admission: RE | Admit: 2024-09-29 | Discharge: 2024-09-29 | Disposition: A | Discharge status: Home / Self Care | Source: Ambulatory Visit | Attending: Obstetrics and Gynecology | Admitting: Obstetrics and Gynecology

## 2024-09-29 DIAGNOSIS — Z1231 Encounter for screening mammogram for malignant neoplasm of breast: Secondary | ICD-10-CM

## 2024-10-12 ENCOUNTER — Ambulatory Visit: Admitting: Nurse Practitioner

## 2024-10-12 ENCOUNTER — Encounter: Payer: Self-pay | Admitting: Nurse Practitioner

## 2024-10-12 VITALS — BP 120/62 | HR 80 | Ht 60.0 in | Wt 129.0 lb

## 2024-10-12 DIAGNOSIS — Z860101 Personal history of adenomatous and serrated colon polyps: Secondary | ICD-10-CM | POA: Diagnosis not present

## 2024-10-12 DIAGNOSIS — K219 Gastro-esophageal reflux disease without esophagitis: Secondary | ICD-10-CM

## 2024-10-12 DIAGNOSIS — R1310 Dysphagia, unspecified: Secondary | ICD-10-CM

## 2024-10-12 DIAGNOSIS — K224 Dyskinesia of esophagus: Secondary | ICD-10-CM | POA: Diagnosis not present

## 2024-10-12 DIAGNOSIS — R0789 Other chest pain: Secondary | ICD-10-CM | POA: Diagnosis not present

## 2024-10-12 MED ORDER — FAMOTIDINE 20 MG PO TABS: 20.0000 mg | ORAL_TABLET | Freq: Every day | ORAL | 1 refills | Status: AC

## 2024-10-12 NOTE — Patient Instructions (Addendum)
 _______________________________________________________  If your blood pressure at your visit was 140/90 or greater, please contact your primary care physician to follow up on this.  _______________________________________________________  If you are age 75 or older, your body mass index should be between 23-30. Your Body mass index is 25.19 kg/m. If this is out of the aforementioned range listed, please consider follow up with your Primary Care Provider.  If you are age 32 or younger, your body mass index should be between 19-25. Your Body mass index is 25.19 kg/m. If this is out of the aformentioned range listed, please consider follow up with your Primary Care Provider.   ________________________________________________________  The [facility] providers would like to encourage you to use MYCHART to communicate with providers for non-urgent requests or questions.  Due to long hold times on the telephone, sending your provider a message by [facility] may be a faster and more efficient way to get a response.  Please allow 48 business hours for a response.  Please remember that this is for non-urgent requests.  _______________________________________________________  [facility] is using a team-based approach to care.  Your team is made up of your doctor and two to three APPS. Our APPS (Nurse Practitioners and Physician Assistants) work with your physician to ensure care continuity for you. They are fully qualified to address your health concerns and develop a treatment plan. They communicate directly with your gastroenterologist to care for you. Seeing the Advanced Practice Practitioners on your physician's team can help you by facilitating care more promptly, often allowing for earlier appointments, access to diagnostic testing, procedures, and other specialty referrals.   Drink 8 glasses of water daily  We have sent the following medications to your pharmacy for you to pick up at  your convenience: Pepcid  20mg  daily at bedtime  Please call Colleen's nurse in 2 weeks at [phone] if you haven't heard anything from our office  Continue with cardiology regarding chest pain   Thank you for trusting me with your gastrointestinal care!   Adrienne [name], CRNP

## 2024-10-12 NOTE — Progress Notes (Signed)
 "    10/12/2024 Adrienne [name] [id number] 06/27/1951  Primary Gastroenterologist: Dr. Albertus    Chief Complaint: Thick phlegm in throat at night  History of Present Illness: Adrienne [name] is a 74 year old female with a past medial history of anxiety, DM II, GERD, esophageal dysmotility (hypercontractile esophagus) and IBS-D.  Discussed the use of AI scribe software for clinical note transcription with the patient, who gave verbal consent to proceed.  History of Present Illness Adrienne [name] is a 74 year old female with esophageal dysmotility and dysphagia who presents for follow-up of persistent throat mucus and dysphagia.  Following initiation of Voquenza 20mg  daily after her last visit, she experienced significant improvement in throat mucus and reduced need for throat clearing. The patient reports that the ENT performed a CT scan and laryngoscopy, and told her that her symptoms were likely related to her esophagus.  Over the past month, thick mucus in the throat has recurred, especially at night, requiring frequent throat clearing. Vilquinza is taken in the mornings, but she is unsure if its effect persists throughout the day. The patient reports that the ENT told her it was not a sinus issue.  Dysphagia persists, particularly with solid foods such as meat and bread, while softer foods are generally tolerated. Symptoms occur frequently, sometimes nightly, and are similar to those present in October 2023. The patient recalls being told by her doctor that she was probably having more esophageal spasms after her swallow study in October 2023.  Her most recent EGD was 12/22/2023 which showed a tortuous esophagus, no endoscopic esophageal abnormality to explain dysphagia, esophagus was dilated.  PPI and diltiazem  240 mg extended release was continued and if dysphagia persisted to consider Botox to the UES per ENT.  She has considered but not pursued Botox treatment for the esophagus due  to uncertainty and nervousness.  Intermittent sharp pain in the upper chest occurs at night, does not awaken her from sleep, and is not present every day.  She denies chest pain associated with eating or activity.  Constipation is managed with Miralax as needed to avoid diarrhea. Tramadol for back pain has contributed to constipation. She has a history of irritable bowel syndrome with prior episodes of diarrhea. No bloody or black stools reported.  Diabetes is managed with multiple medications, some of which cause dryness. She is increasing water intake, currently at least 16 ounces daily, but acknowledges this is insufficient.  Weight has been stable, with prior weight loss attributed to a higher dose of Trulicity. No fevers, night sweats, or unintentional weight loss.     Latest Ref Rng & Units 01/07/2014   12:35 PM 08/16/2010   11:48 AM  CBC  WBC 4.5 - 10.5 10*3/microliter  11.5   Hemoglobin 12.0 - 15.0 g/dL 85.6  87.3   Hematocrit 36.0 - 46.0 % 42.0  37.8   Platelets 150.0 - 400.0 K/uL  286.0         Latest Ref Rng & Units 08/18/2024    7:05 AM 03/03/2023    2:00 PM 10/31/2017    2:00 PM  CMP  Glucose 70 - 99 mg/dL 887  75  859   BUN 8 - 23 mg/dL 11  12  13    Creatinine 0.44 - 1.00 mg/dL 9.37  9.27  9.12   Sodium 135 - 145 mmol/L 137  140  139   Potassium 3.5 - 5.1 mmol/L 4.1  4.4  4.1   Chloride 98 - 111  mmol/L 103  101  103   CO2 22 - 32 mmol/L 25  29  25    Calcium 8.9 - 10.3 mg/dL 9.7  9.5  9.4     GI PROCEDURES:  EGD 12/22/2023: - Tortuous esophagus.  - No endoscopic esophageal abnormality to explain patient's dysphagia. Esophagus dilated with 52 and 54 Fr Maloney. - Normal stomach.  - Normal examined duodenum.  - No specimens collected.  Esophageal manometry 11/22/2022: - Normal relaxation of the EG junction - Hiatal hernia - Hypercontractile esophagus - Consider CT chest or EUS to exclude any esophageal infiltrative process and may use calcium channel blockers or 5  phosphodiesterase inhibitors for symptom relief (chest CT 12/16/2022 showed a normal esophagus without adenopathy).  EGD 04/03/2021: - Normal esophagus. - No endoscopic esophageal abnormality to explain patient's dysphagia. Esophagus dilated with 52 Fr Maloney. - Normal stomach. - Normal examined duodenum. - No specimens collected.   Colonoscopy 04/03/2021: - One 3 mm polyp in the descending colon, removed with a cold snare. Resected and retrieved. - The examination was otherwise normal. -No further colon polyp surveillance colonoscopies unless recommended by her PCP - TUBULAR ADENOMA (1 OF 1 FRAGMENTS) - NO HIGH-GRADE DYSPLASIA OR MALIGNANCY IDENTIFIED   Past Medical History:  Diagnosis Date   Allergies    Arthritis    hands/fingers   Carpal tunnel syndrome    Depression    Diabetes (HCC)    Eczema    Esophageal reflux    Esophageal stricture    Female bladder prolapse    Generalized anxiety disorder    GERD (gastroesophageal reflux disease)    Hiatal hernia    Hyperlipidemia    Hypertension    states under control with med., has been on med. x 30 yr.   Insulin dependent diabetes mellitus    Irritable bowel syndrome    Nutcracker esophagus    Trigger finger of right hand 09/2017   index and middle fingers   Vertigo    Wears hearing aid in both ears    Past Surgical History:  Procedure Laterality Date   ADENOIDECTOMY     ANTERIOR (CYSTOCELE) AND POSTERIOR REPAIR (RECTOCELE) WITH XENFORM GRAFT AND SACROSPINOUS FIXATION N/A 08/18/2024   Procedure: ANTERIOR (CYSTOCELE) AND POSTERIOR REPAIR (RECTOCELE)  GRAFT AND SACROSPINOUS FIXATION;  Surgeon: Guadlupe Lianne DASEN, MD;  Location: [facility] OR;  Service: Gynecology;  Laterality: N/A;   CARPAL TUNNEL RELEASE Left 01/07/2014   Procedure: LEFT CARPAL TUNNEL RELEASE;  Surgeon: Franky JONELLE Curia, MD;  Location: [facility];  Service: Orthopedics;  Laterality: Left;   CARPAL TUNNEL RELEASE Right 03/06/2023   Procedure: RIGHT CARPAL  TUNNEL RELEASE;  Surgeon: Curia Franky, MD;  Location: [facility];  Service: Orthopedics;  Laterality: Right;   COLONOSCOPY     CYSTOSCOPY N/A 08/18/2024   Procedure: CYSTOSCOPY;  Surgeon: Guadlupe Lianne DASEN, MD;  Location: [facility] OR;  Service: Gynecology;  Laterality: N/A;   ESOPHAGEAL MANOMETRY  05/28/2006   ESOPHAGEAL MANOMETRY N/A 11/13/2022   Procedure: ESOPHAGEAL MANOMETRY (EM);  Surgeon: Albertus Gordy HERO, MD;  Location: [facility];  Service: Gastroenterology;  Laterality: N/A;   EYE SURGERY Right    tear duct repair   INJECTION, BULKING AGENT, URETHRA N/A 08/18/2024   Procedure: INJECTION, BULKING AGENT, URETHRA;  Surgeon: Guadlupe Lianne DASEN, MD;  Location: [facility] OR;  Service: Gynecology;  Laterality: N/A;   NASAL SINUS SURGERY     PERINEOPLASTY N/A 08/18/2024   Procedure: PERINEOPLASTY;  Surgeon: Guadlupe Lianne DASEN, MD;  Location: [facility] OR;  Service: Gynecology;  Laterality: N/A;  possible perineorrhaphy   TONSILLECTOMY     TRIGGER FINGER RELEASE Right 11/06/2017   Procedure: RELEASE TRIGGER FINGER/A-1 PULLEY RIGHT INDEX FINGER AND RIGHT MIDDLE FINGER;  Surgeon: Murrell Drivers, MD;  Location: [facility];  Service: Orthopedics;  Laterality: Right;   UPPER GASTROINTESTINAL ENDOSCOPY  10/18/2013   with dilation; with Propofol    UPPER GASTROINTESTINAL ENDOSCOPY  10/30/2016   Medications Ordered Prior to Encounter[1] Allergies[2]  Current Medications, Allergies, Past Medical History, Past Surgical History, Family History and Social History were reviewed in [employer] record.  Review of Systems:   Constitutional: Negative for fever, sweats, chills or weight loss.  Respiratory: Negative for shortness of breath.   Cardiovascular: Negative for chest pain, palpitations and leg swelling.  Gastrointestinal: See HPI.  Musculoskeletal: Negative for back pain or muscle aches.  Neurological: Negative for dizziness, headaches or paresthesias.   Physical Exam: BP 120/62    Pulse 80   Ht 5' (1.524 m)   Wt 129 lb (58.5 kg)   BMI 25.19 kg/m  Wt Readings from Last 3 Encounters:  10/12/24 129 lb (58.5 kg)  08/18/24 128 lb (58.1 kg)  08/16/24 129 lb (58.5 kg)    General: 74 year old female in no acute distress. Head: Normocephalic and atraumatic. Eyes: No scleral icterus. Conjunctiva pink . Ears: Normal auditory acuity. Mouth: Dentition intact. No ulcers or lesions.  Lungs: Clear throughout to auscultation. Heart: Regular rate and rhythm, no murmur. Abdomen: Soft, nontender and nondistended. No masses or hepatomegaly. Normal bowel sounds x 4 quadrants.  Rectal: Deferred. Musculoskeletal: Symmetrical with no gross deformities. Extremities: No edema. Neurological: Alert oriented x 4. No focal deficits.  Psychological: Alert and cooperative. Normal mood and affect  Assessment and Recommendations:  GERD, esophageal dysmotility (hypercontractile esophagus) with recurrent dysphagia and mucus production in throat.  Remains on Voquenza 20mg  daily.  - Continue Voquenza 20 mg 1 tab p.o. daily - Add Famotidine  20 mg 1 tab p.o. nightly - Patient is considering Botox injections to the upper esophageal sphincter with ENT, await further recommendations per Dr. Albertus - Increase water intake to 8 glasses equal to 64 ounces once daily  History of colon polyps.  Colonoscopy 03/2021 identified one 3 mm tubular adenomatous polyp removed from the colon. - No further colon polyp surveillance colonoscopies recommended due to age  Atypical chest pain.  No chest pain at this time. - Patient instructed to follow-up with cardiology     [1]  Current Outpatient Medications on File Prior to Visit  Medication Sig Dispense Refill   ALPRAZolam (XANAX) 0.25 MG tablet TAKE 1 TABLET BY MOUTH ONCE DAILY AS NEEDED FOR ANXIETY OR SLEEP  0   aspirin 81 MG tablet Take 81 mg by mouth daily.     azelastine  (ASTELIN ) 0.1 % nasal spray Place 2 sprays into both nostrils 2 (two) times daily  as needed for rhinitis. Use in each nostril as directed 30 mL 5   calcium carbonate (OS-CAL) 600 MG TABS tablet Take 500 mg by mouth 2 (two) times daily with a meal.     Continuous Glucose Sensor (FREESTYLE LIBRE 3 SENSOR) MISC Inject into the skin daily in the afternoon.     cyclobenzaprine (FLEXERIL) 10 MG tablet Take 10 mg by mouth 2 (two) times daily as needed for muscle spasms.     diltiazem  (CARDIZEM  LA) 240 MG 24 hr tablet Take 1 tablet by mouth once daily 30 tablet 4  empagliflozin (JARDIANCE) 25 MG TABS tablet Take 25 mg by mouth daily.     estradiol  (ESTRACE ) 0.01 % CREA vaginal cream PLACE 0.5 G VAGINALLY 2 (TWO) TIMES A WEEK. PLACE 0.5 G NIGHTLY FOR TWO WEEKS THEN TWICE A WEEK AFTER 43 g 2   Ferrous Sulfate (IRON SLOW RELEASE) 140 (45 Fe) MG TBCR Take 1 tablet by mouth daily.     fluticasone  (FLONASE ) 50 MCG/ACT nasal spray Place 1 spray into both nostrils in the morning and at bedtime. 16 g 5   furosemide (LASIX) 20 MG tablet Take 20 mg by mouth daily.     Glucagon (GVOKE HYPOPEN 2-PACK) 1 MG/0.2ML SOAJ as directed Subcutaneous for severe hypoglycemia     ibuprofen  (ADVIL ) 600 MG tablet Take 1 tablet (600 mg total) by mouth every 6 (six) hours as needed. 30 tablet 0   insulin glargine (LANTUS) 100 UNIT/ML injection Inject 17 Units into the skin daily. (Patient taking differently: Inject 11 Units into the skin daily.)     isosorbide mononitrate (IMDUR) 30 MG 24 hr tablet Take 15 mg by mouth daily.     levocetirizine (XYZAL ) 5 MG tablet TAKE 1 TABLET BY MOUTH ONCE DAILY IN THE EVENING 32 tablet 5   metFORMIN (GLUCOPHAGE) 1000 MG tablet Take 1,000 mg by mouth 2 (two) times daily with a meal.      Multiple Vitamin (MULTIVITAMIN) tablet Take 1 tablet by mouth daily.     oxyCODONE  (OXY IR/ROXICODONE ) 5 MG immediate release tablet Take 1 tablet (5 mg total) by mouth every 4 (four) hours as needed for severe pain (pain score 7-10). (Patient taking differently: Take 5 mg by mouth every 4 (four)  hours as needed for severe pain (pain score 7-10). Post-op) 10 tablet 0   ramipril (ALTACE) 10 MG tablet Take 5 mg by mouth daily.     rosuvastatin (CRESTOR) 10 MG tablet Take 10 mg by mouth daily.     Trospium  Chloride 60 MG CP24 Take 1 capsule (60 mg total) by mouth daily. 30 capsule 2   TRULICITY 4.5 MG/0.5ML SOPN Inject 4.5 mg into the skin once a week. Tuesday     venlafaxine XR (EFFEXOR-XR) 37.5 MG 24 hr capsule Take 1 capsule by mouth. DAILY     Vonoprazan Fumarate  (VOQUEZNA ) 20 MG TABS Take 1 tablet by mouth daily. 30 tablet 5   acetaminophen  (TYLENOL ) 500 MG tablet Take 1 tablet (500 mg total) by mouth every 6 (six) hours as needed (pain). (Patient not taking: Reported on 10/12/2024) 30 tablet 0   [DISCONTINUED] FLUoxetine (PROZAC) 40 MG capsule Take 40 mg by mouth daily.     [DISCONTINUED] rosiglitazone-metformin (AVANDAMET) 12-998 MG per tablet Take 1 tablet by mouth 2 (two) times daily with a meal.     Current Facility-Administered Medications on File Prior to Visit  Medication Dose Route Frequency Provider Last Rate Last Admin   dexamethasone  (DECADRON ) injection 2 mg  2 mg Other Once [place], Jennifer L, DPM      [2]  Allergies Allergen Reactions   Molds & Smuts     Congestion    Exenatide Other (See Comments)    Other Reaction(s): Injection pain   Sulfa Antibiotics Rash   "

## 2024-10-14 NOTE — Addendum Note (Signed)
 Addended by: CLAUDENE NAOMIE SAILOR on: 10/14/2024 09:45 AM   Modules accepted: Orders

## 2024-10-14 NOTE — Progress Notes (Signed)
 Linda/Dottie: Pls contact patient and let her know the ENT Dr. Albertus recommended for consideration for Botox injections to the upper esophageal sphincter was Dr. Carlie. Pls enter referral. THX.

## 2024-10-14 NOTE — Progress Notes (Signed)
 Left message advising patient that Dr Albertus recommends she see Dr Vaughan Ricker, ENT for consideration of botox to the lower esophagus. Advised that I have placed a referral to Dr Ricker office. She is encouraged to call our office if she does not hear from Dr Ricker office or should she have any questions.

## 2024-10-27 NOTE — Progress Notes (Signed)
 Patient is scheduled to see Dr Carlie 11/18/24.

## 2024-11-15 ENCOUNTER — Encounter: Admitting: Obstetrics
